# Patient Record
Sex: Female | Born: 1960
Health system: Southern US, Community
[De-identification: ages and names within clinical notes are randomized; demographics above are authoritative.]

## PROBLEM LIST (undated history)

## (undated) DIAGNOSIS — K5900 Constipation, unspecified: Secondary | ICD-10-CM

## (undated) DIAGNOSIS — T7840XA Allergy, unspecified, initial encounter: Secondary | ICD-10-CM

## (undated) DIAGNOSIS — M47816 Spondylosis without myelopathy or radiculopathy, lumbar region: Secondary | ICD-10-CM

## (undated) HISTORY — PX: VAGINAL HYSTERECTOMY: SUR661

## (undated) HISTORY — DX: Spondylosis without myelopathy or radiculopathy, lumbar region: M47.816

## (undated) HISTORY — DX: Allergy, unspecified, initial encounter: T78.40XA

## (undated) HISTORY — PX: BREAST CYST EXCISION: SHX579

## (undated) HISTORY — PX: CHOLECYSTECTOMY: SHX55

## (undated) HISTORY — DX: Constipation, unspecified: K59.00

---

## 1997-04-13 ENCOUNTER — Other Ambulatory Visit: Admission: RE | Admit: 1997-04-13 | Discharge: 1997-04-13 | Payer: Self-pay | Admitting: Obstetrics and Gynecology

## 1997-06-15 ENCOUNTER — Observation Stay (HOSPITAL_COMMUNITY): Admission: AD | Admit: 1997-06-15 | Discharge: 1997-06-17 | Payer: Self-pay | Admitting: Obstetrics and Gynecology

## 1998-02-17 ENCOUNTER — Ambulatory Visit (HOSPITAL_COMMUNITY): Admission: RE | Admit: 1998-02-17 | Discharge: 1998-02-17 | Payer: Self-pay | Admitting: *Deleted

## 1998-07-15 ENCOUNTER — Other Ambulatory Visit: Admission: RE | Admit: 1998-07-15 | Discharge: 1998-07-15 | Payer: Self-pay | Admitting: Obstetrics and Gynecology

## 1998-09-17 ENCOUNTER — Encounter: Payer: Self-pay | Admitting: Family Medicine

## 1998-09-17 ENCOUNTER — Ambulatory Visit (HOSPITAL_COMMUNITY): Admission: RE | Admit: 1998-09-17 | Discharge: 1998-09-17 | Payer: Self-pay | Admitting: Family Medicine

## 2000-08-20 ENCOUNTER — Other Ambulatory Visit: Admission: RE | Admit: 2000-08-20 | Discharge: 2000-08-20 | Payer: Self-pay | Admitting: Obstetrics and Gynecology

## 2001-09-26 ENCOUNTER — Other Ambulatory Visit: Admission: RE | Admit: 2001-09-26 | Discharge: 2001-09-26 | Payer: Self-pay | Admitting: Obstetrics and Gynecology

## 2002-09-04 ENCOUNTER — Emergency Department (HOSPITAL_COMMUNITY): Admission: EM | Admit: 2002-09-04 | Discharge: 2002-09-04 | Payer: Self-pay | Admitting: *Deleted

## 2002-12-12 ENCOUNTER — Other Ambulatory Visit: Admission: RE | Admit: 2002-12-12 | Discharge: 2002-12-12 | Payer: Self-pay | Admitting: Obstetrics and Gynecology

## 2004-02-03 ENCOUNTER — Other Ambulatory Visit: Admission: RE | Admit: 2004-02-03 | Discharge: 2004-02-03 | Payer: Self-pay | Admitting: Obstetrics and Gynecology

## 2004-02-10 ENCOUNTER — Ambulatory Visit: Payer: Self-pay | Admitting: Cardiovascular Disease

## 2005-03-20 ENCOUNTER — Other Ambulatory Visit: Admission: RE | Admit: 2005-03-20 | Discharge: 2005-03-20 | Payer: Self-pay | Admitting: Obstetrics and Gynecology

## 2005-04-12 ENCOUNTER — Emergency Department (HOSPITAL_COMMUNITY): Admission: EM | Admit: 2005-04-12 | Discharge: 2005-04-12 | Payer: Self-pay | Admitting: Emergency Medicine

## 2007-08-09 IMAGING — CR DG CHEST 2V
1 series · 2 of 2 positions shown · non-contrast
Comparison: NONE

CLINICAL DATA: Attn. CRISS  Extreme fatigue. 

CHEST TWO VIEW (PA AND LATERAL)

[Series 1: view not recorded · 0.17mm/px · 2 of 2 slices shown]
[im 1/2]
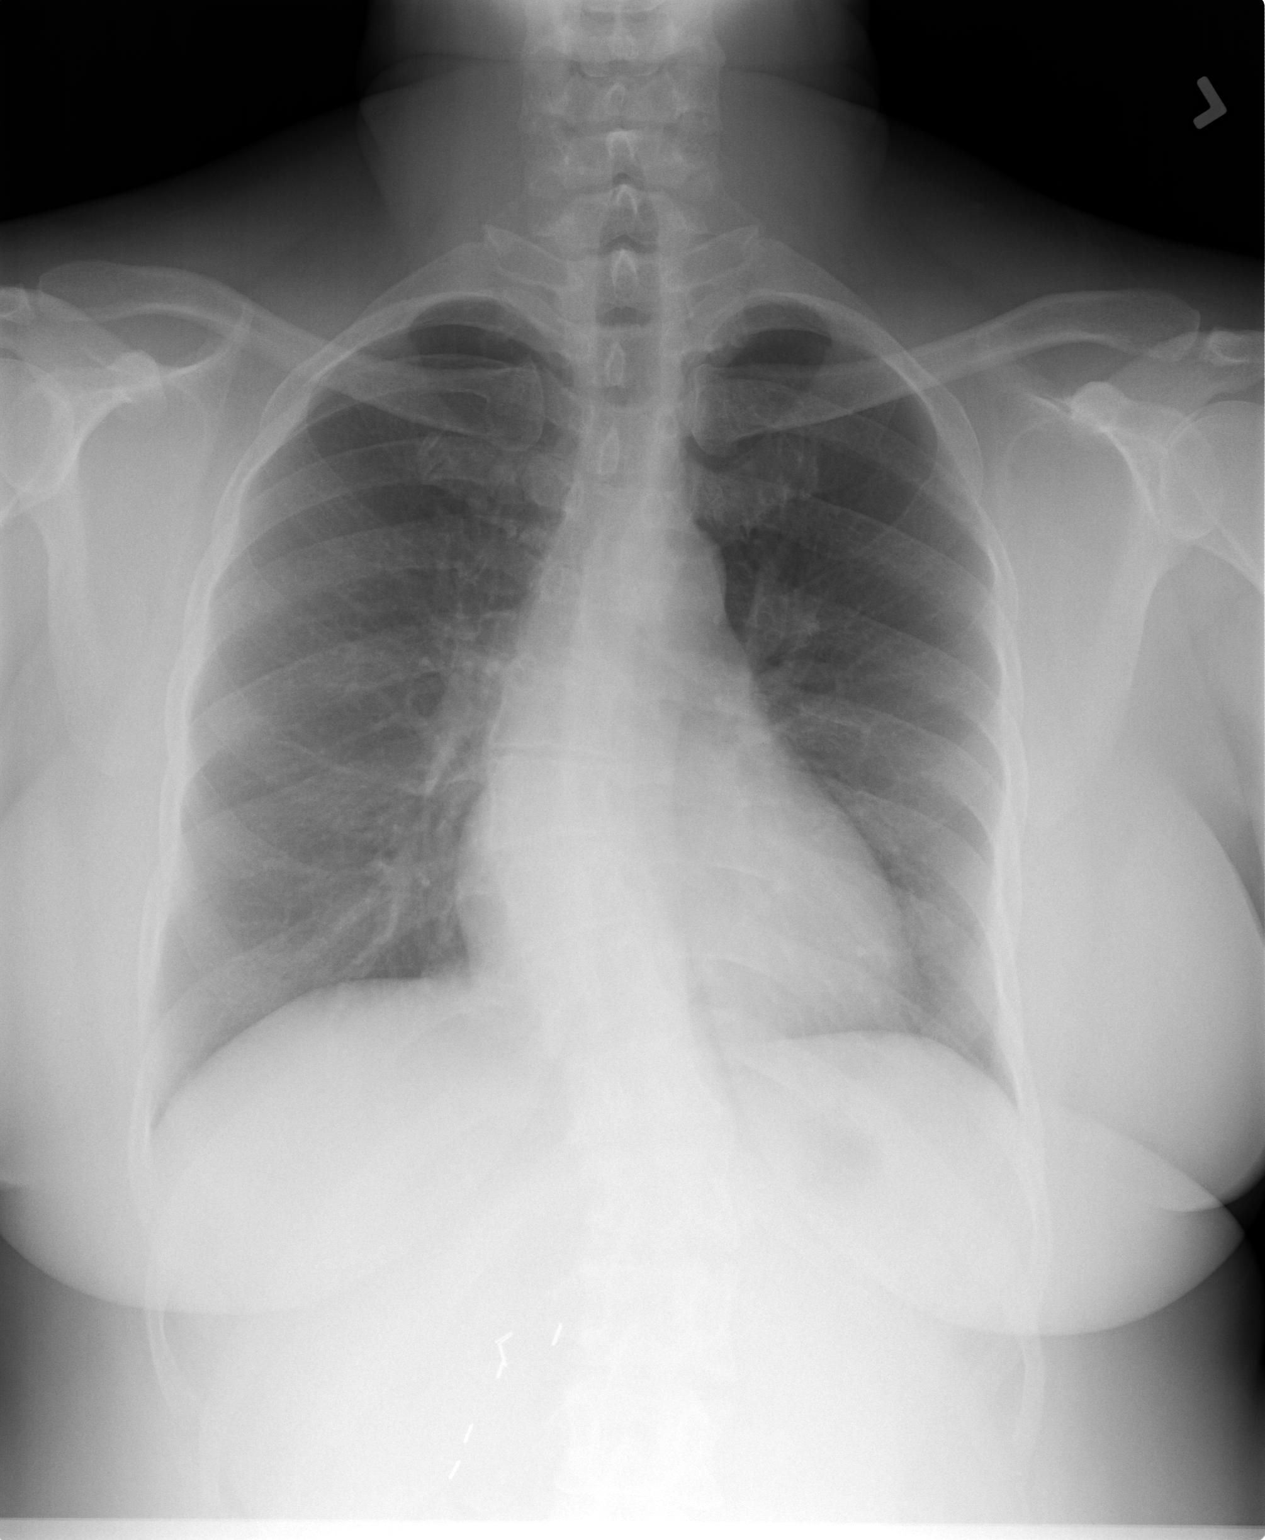
[im 2/2]
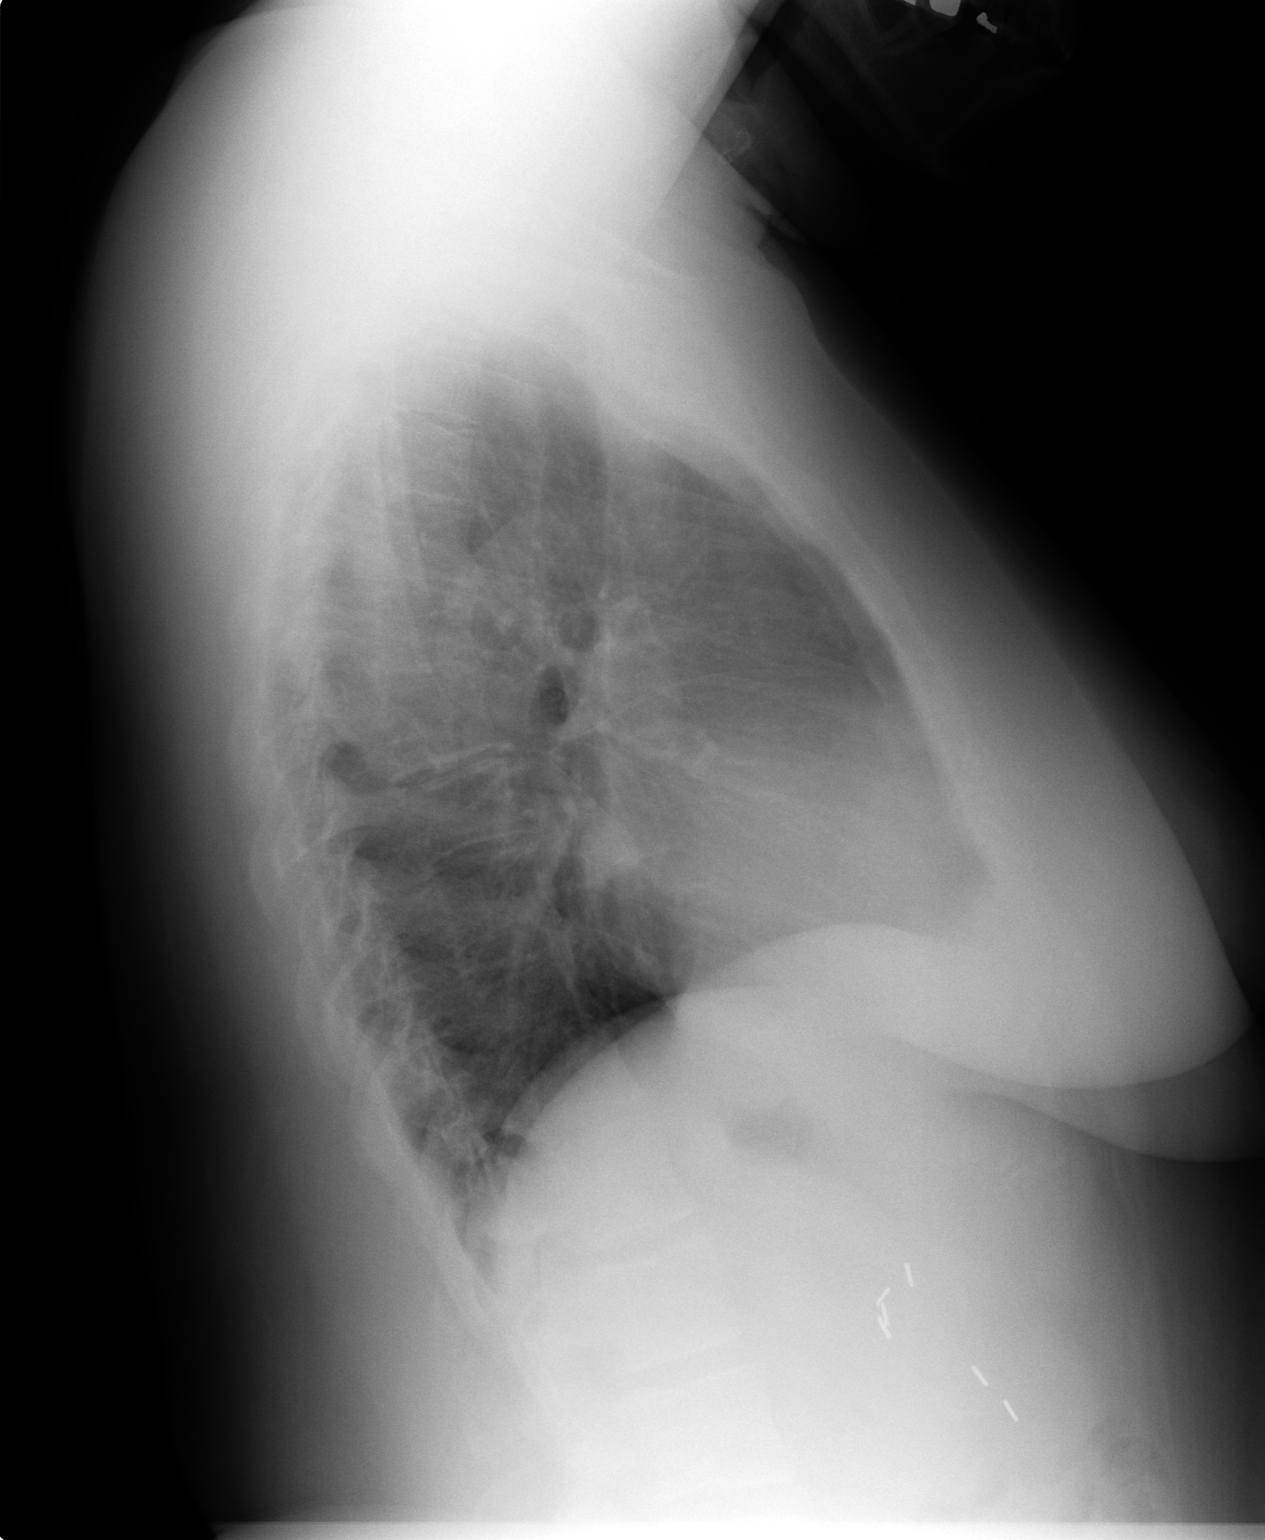

[2 of 2 positions shown; findings below may reference images not displayed]

FINDINGS: Heart size is normal. Lungs are clear. Thoracic and 
thoracolumbar scoliosis, S-type, 10-15 degrees in the thoracic and 
thoracolumbar region.
IMPRESSION: Scoliosis. No acute process in the chest. CRISS 
[DATE]  Tran Date:  [DATE] DAS  [REDACTED]

## 2009-10-27 ENCOUNTER — Encounter: Admission: RE | Admit: 2009-10-27 | Discharge: 2009-10-27 | Payer: Self-pay | Admitting: Family Medicine

## 2009-10-27 IMAGING — CT CT HEAD W/O CM
2 series · 16 of 30 positions shown, 18 images · non-contrast
Comparison: None

CLINICAL DATA: Headaches and dizziness.

CT HEAD WITHOUT CONTRAST
TECHNIQUE: Contiguous axial images were obtained from the base of
the skull through the vertex without contrast.

[Series 2: head w/o · axial · non-contrast · 0.43mm/px · z∈[+34,+145]mm · 8 of 28 slices shown, 10 images]
[im 4/28  brain]
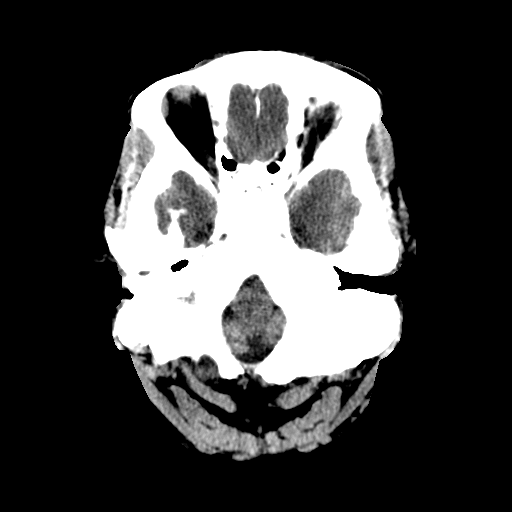
[im 4/28  bone]
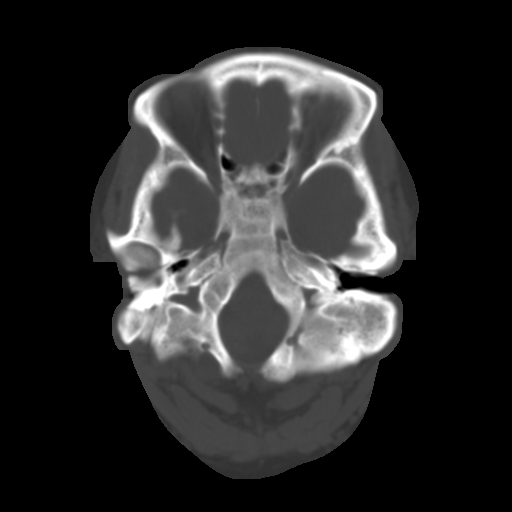
[im 7/28  brain]
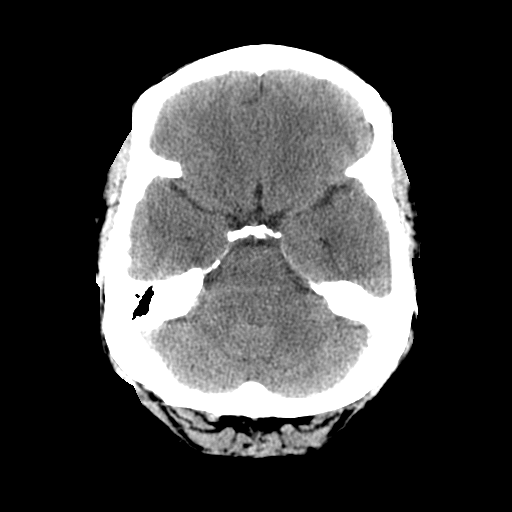
[im 10/28  brain]
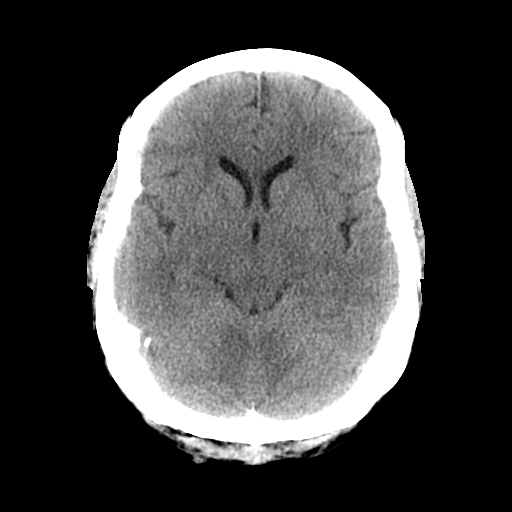
[im 13/28  brain]
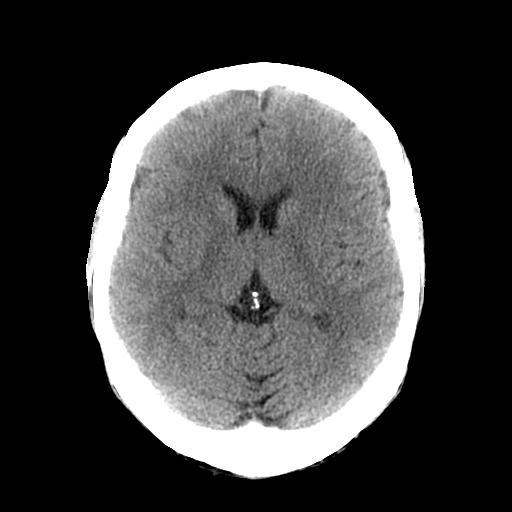
[im 16/28  brain]
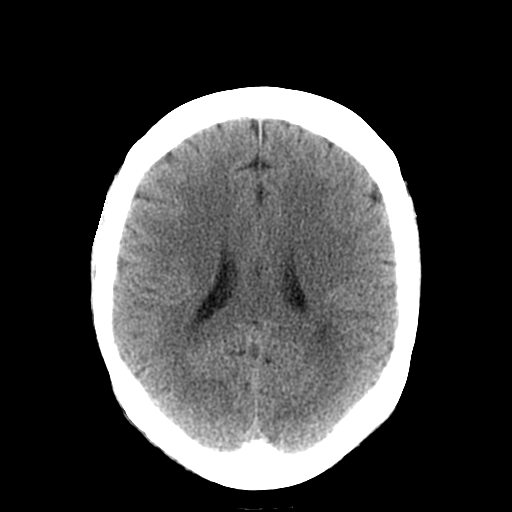
[im 16/28  bone]
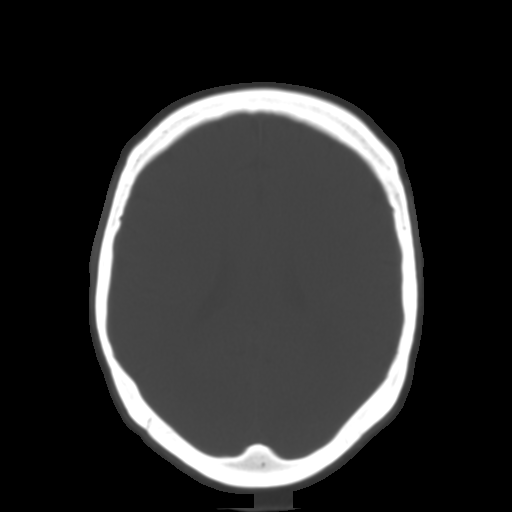
[im 19/28  brain]
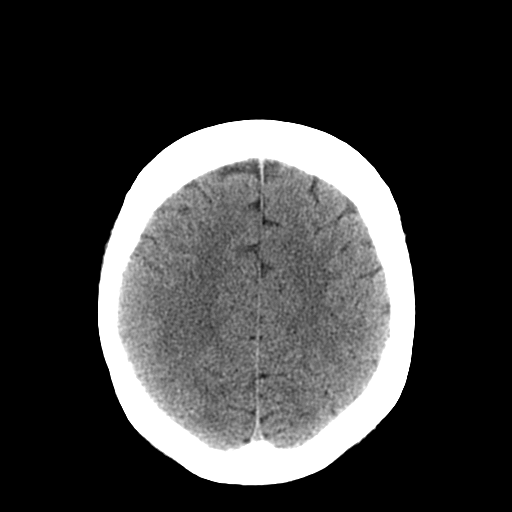
[im 22/28  brain]
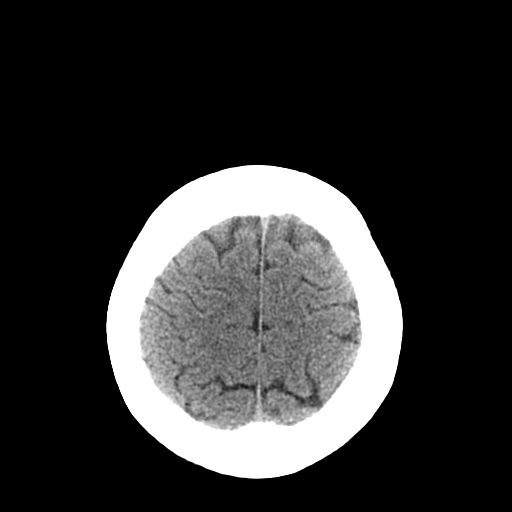
[im 25/28  brain]
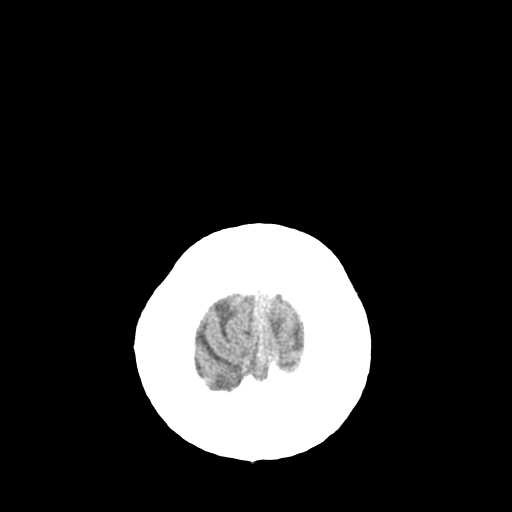

[Series 3: head bone · axial · 0.43mm/px · z∈[+30,+146]mm · 8 of 56 slices shown]
[im 6/56  bone]
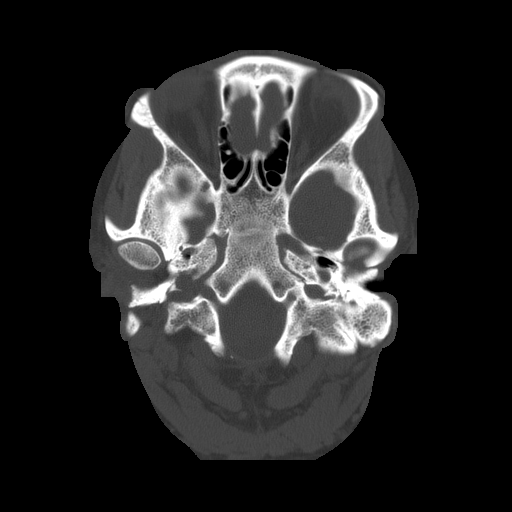
[im 12/56  bone]
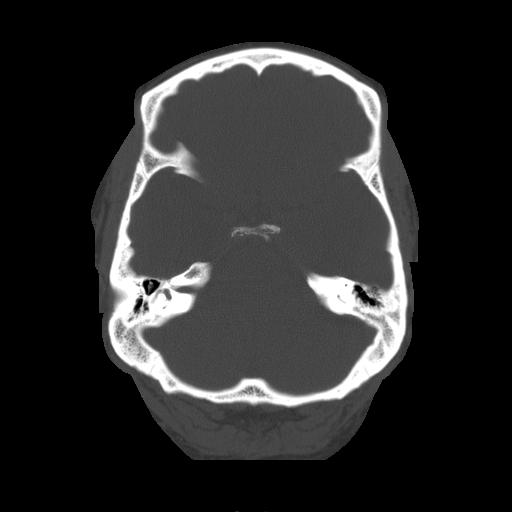
[im 18/56  bone]
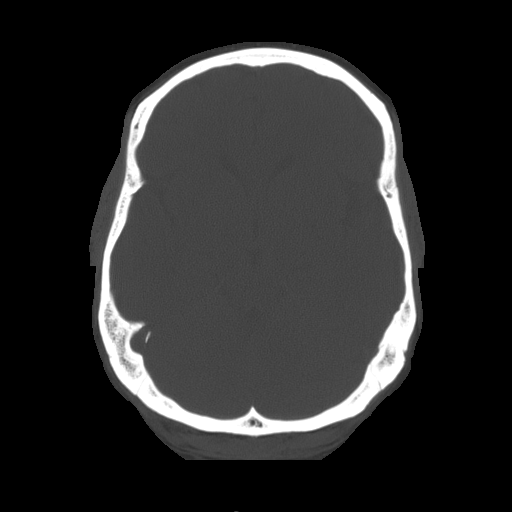
[im 24/56  bone]
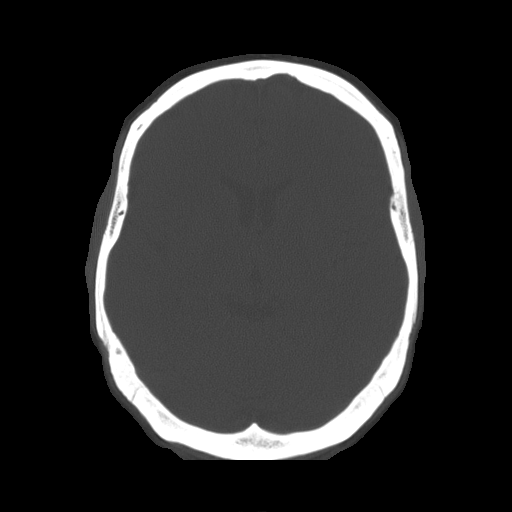
[im 32/56  bone]
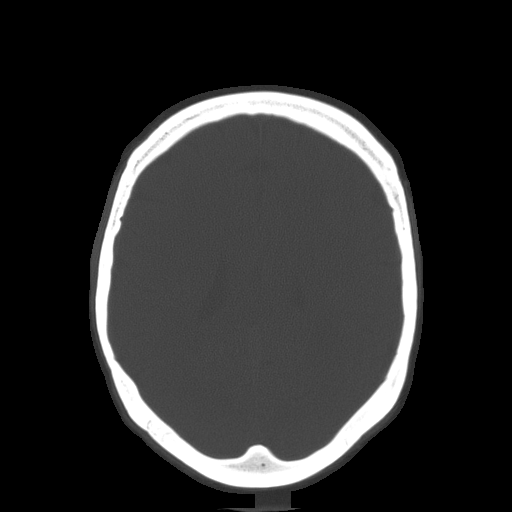
[im 38/56  bone]
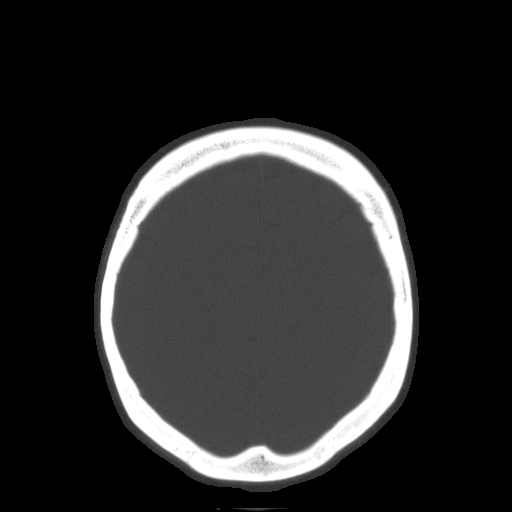
[im 44/56  bone]
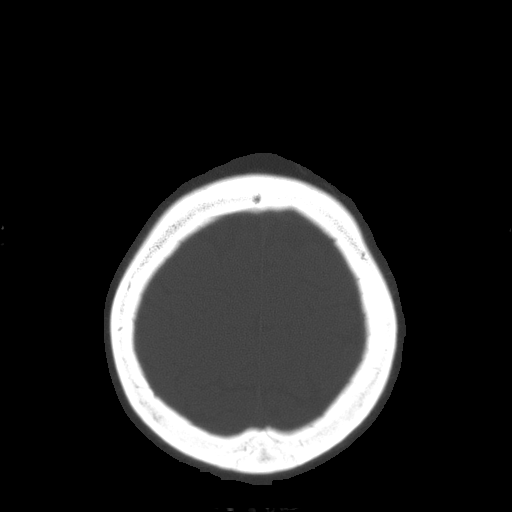
[im 50/56  bone]
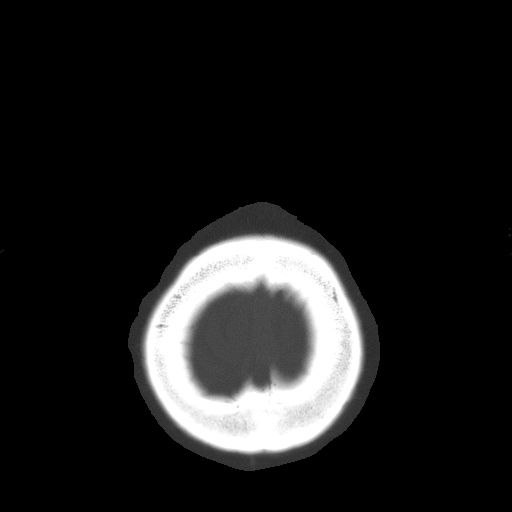

[16 of 30 positions shown; findings below may reference images not displayed]

FINDINGS: The brain has a normal appearance without evidence for
hemorrhage, infarction, hydrocephalus, or mass lesion.  There is no
extra axial fluid collection.  The skull and paranasal sinuses are
normal.
IMPRESSION: No acute intracranial abnormalities.

## 2010-01-23 ENCOUNTER — Encounter: Payer: Self-pay | Admitting: Obstetrics and Gynecology

## 2010-05-16 ENCOUNTER — Ambulatory Visit (AMBULATORY_SURGERY_CENTER): Payer: BC Managed Care – PPO | Admitting: *Deleted

## 2010-05-16 VITALS — Ht 64.0 in | Wt 220.3 lb

## 2010-05-16 DIAGNOSIS — K625 Hemorrhage of anus and rectum: Secondary | ICD-10-CM

## 2010-05-16 DIAGNOSIS — K649 Unspecified hemorrhoids: Secondary | ICD-10-CM

## 2010-05-16 MED ORDER — PEG-KCL-NACL-NASULF-NA ASC-C 100 G PO SOLR: ORAL | Status: DC

## 2010-05-17 ENCOUNTER — Encounter: Payer: Self-pay | Admitting: Internal Medicine

## 2010-05-20 ENCOUNTER — Other Ambulatory Visit: Payer: Self-pay | Admitting: Internal Medicine

## 2010-05-24 ENCOUNTER — Ambulatory Visit (AMBULATORY_SURGERY_CENTER): Payer: BC Managed Care – PPO | Admitting: Internal Medicine

## 2010-05-24 ENCOUNTER — Encounter: Payer: Self-pay | Admitting: Internal Medicine

## 2010-05-24 VITALS — HR 84 | Temp 97.2°F | Resp 18 | Ht 64.0 in | Wt 220.0 lb

## 2010-05-24 DIAGNOSIS — K635 Polyp of colon: Secondary | ICD-10-CM

## 2010-05-24 DIAGNOSIS — Z1211 Encounter for screening for malignant neoplasm of colon: Secondary | ICD-10-CM

## 2010-05-24 DIAGNOSIS — D126 Benign neoplasm of colon, unspecified: Secondary | ICD-10-CM

## 2010-05-24 DIAGNOSIS — K5730 Diverticulosis of large intestine without perforation or abscess without bleeding: Secondary | ICD-10-CM

## 2010-05-24 DIAGNOSIS — K625 Hemorrhage of anus and rectum: Secondary | ICD-10-CM

## 2010-05-24 LAB — HM COLONOSCOPY

## 2010-05-24 MED ORDER — SODIUM CHLORIDE 0.9 % IV SOLN: 500.0000 mL | INTRAVENOUS | Status: DC

## 2010-05-24 NOTE — Patient Instructions (Signed)
Resume all medications. Information given on polyps, diverticulosis, high fiber diet. 

## 2010-05-25 ENCOUNTER — Telehealth: Payer: Self-pay

## 2010-05-25 NOTE — Telephone Encounter (Signed)
No ID on answering machine. 

## 2010-09-09 ENCOUNTER — Other Ambulatory Visit: Payer: Self-pay | Admitting: Obstetrics and Gynecology

## 2010-09-09 DIAGNOSIS — R928 Other abnormal and inconclusive findings on diagnostic imaging of breast: Secondary | ICD-10-CM

## 2010-10-05 ENCOUNTER — Ambulatory Visit
Admission: RE | Admit: 2010-10-05 | Discharge: 2010-10-05 | Disposition: A | Discharge status: Home / Self Care | Payer: BC Managed Care – PPO | Source: Ambulatory Visit | Attending: Obstetrics and Gynecology | Admitting: Obstetrics and Gynecology

## 2010-10-05 DIAGNOSIS — R928 Other abnormal and inconclusive findings on diagnostic imaging of breast: Secondary | ICD-10-CM

## 2010-10-06 IMAGING — MG MM DIGITAL DIAGNOSTIC UNILAT L {BCG}
2 series · 2 of 2 positions shown · non-contrast
Comparison: Multiple priors

CLINICAL DATA: Abnormal screening, left breast

DIGITAL DIAGNOSTIC LEFT MAMMOGRAM WITHOUT CAD AND LEFT BREAST
ULTRASOUND:

[L CC]
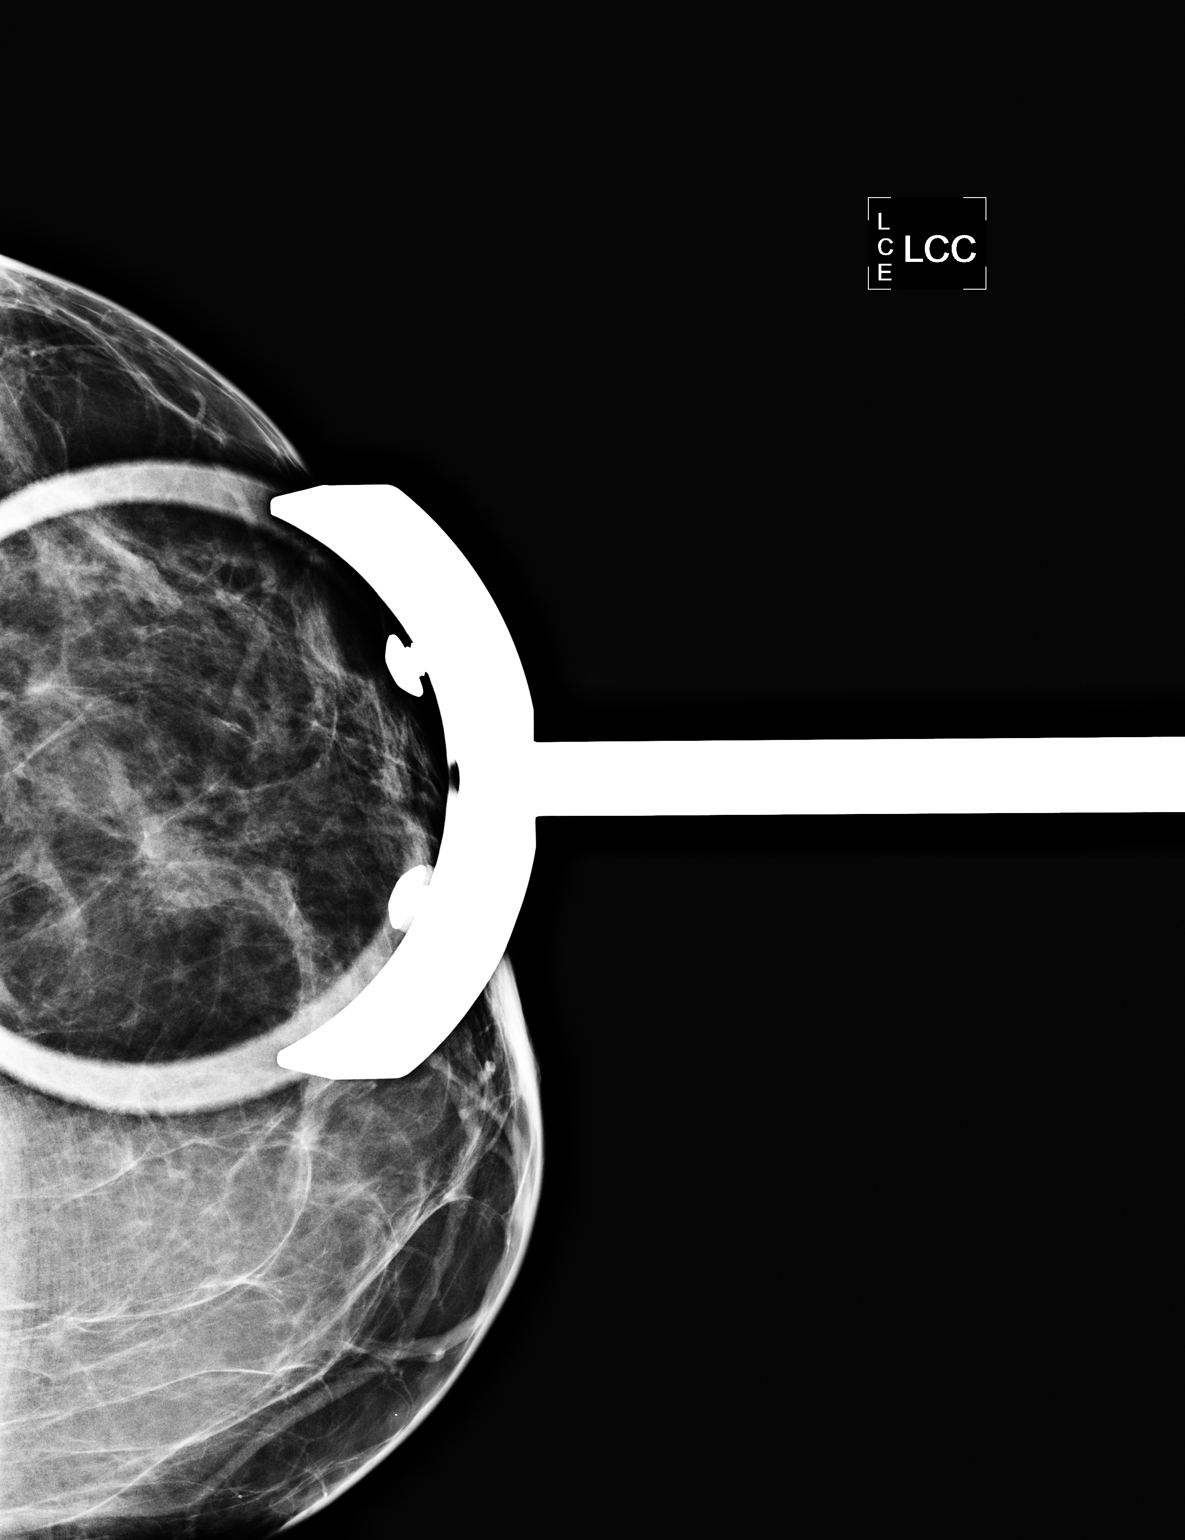

[L MLO]
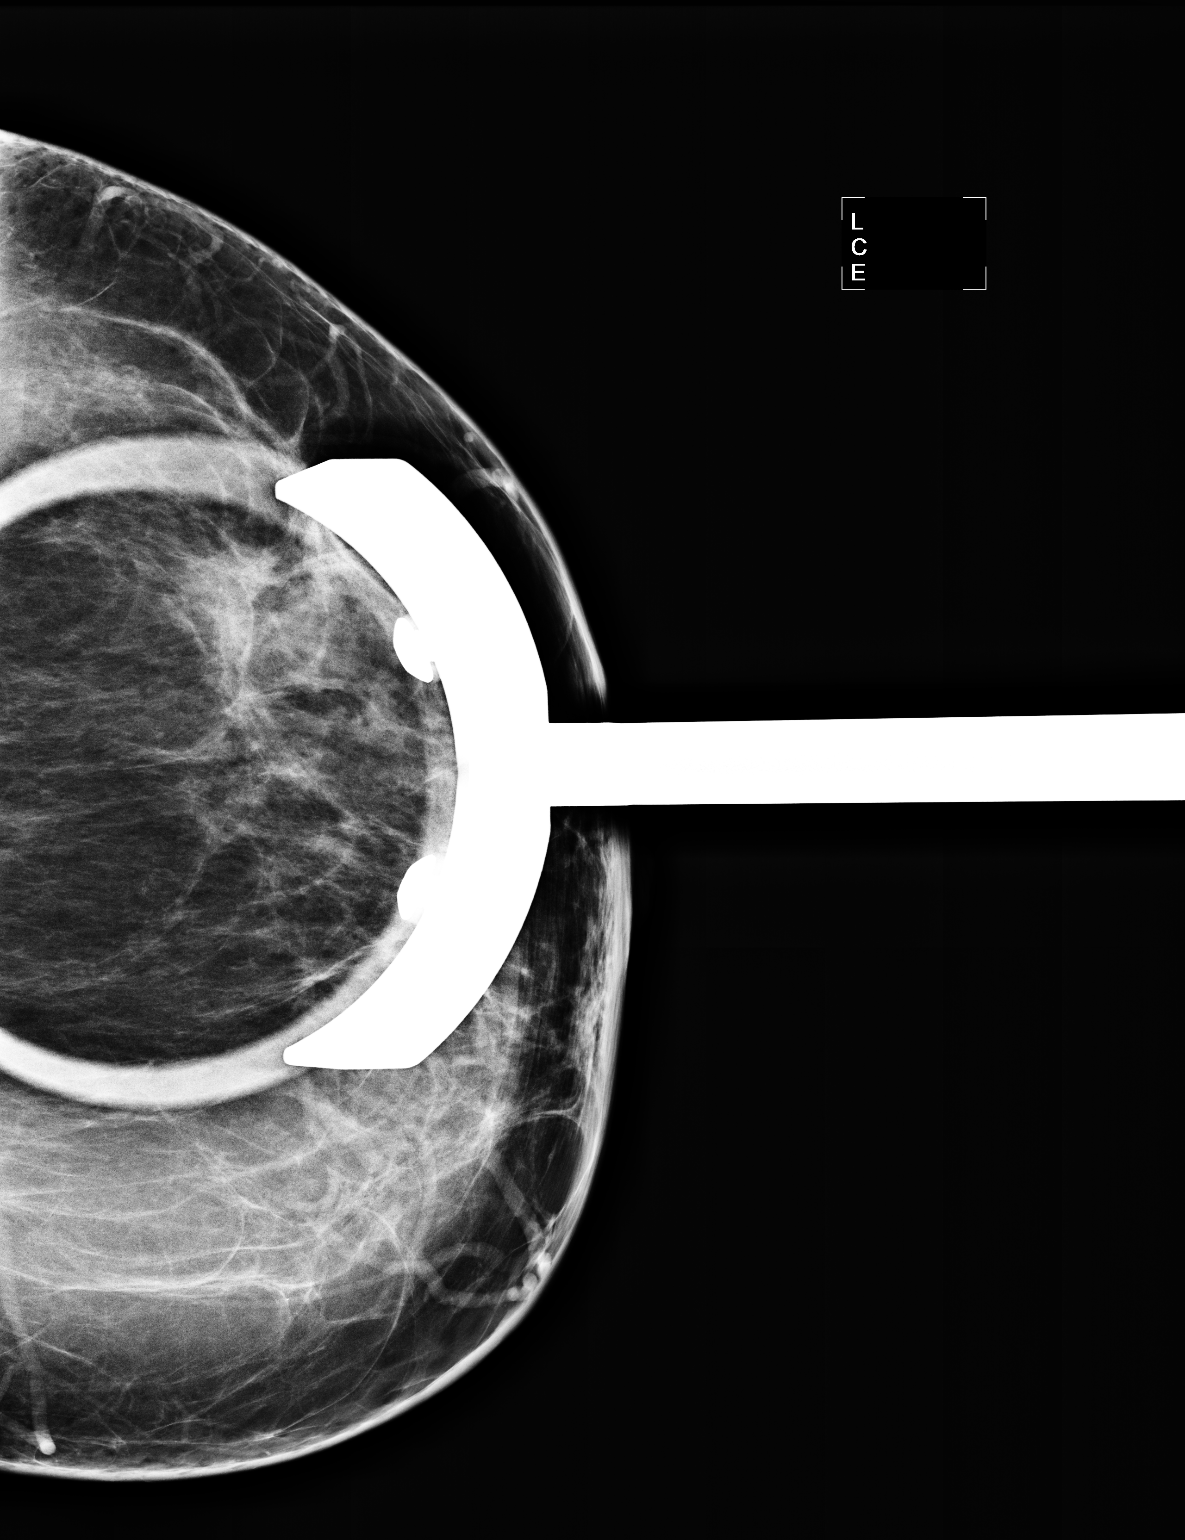

[2 of 2 positions shown; findings below may reference images not displayed]

FINDINGS: Spot compression views in the upper-outer quadrant of
the right breast demonstrate glandular tissue without discrete
mass.  Distortion is noted in the middle third of the breast.

On physical exam, I see a scar along the two to three o'clock
position of the left breast 4 cm from the nipple.  No mass is
palpated.

Ultrasound is performed, showing normal tissue in the upper-outer
quadrant of the left breast.
IMPRESSION: Postsurgical changes, left breast.  No evidence of
malignancy.  Recommend screening mammography in 1 year.

BI-RADS CATEGORY 2:  Benign finding(s).

## 2012-04-01 ENCOUNTER — Other Ambulatory Visit: Payer: Self-pay | Admitting: Obstetrics and Gynecology

## 2012-04-01 DIAGNOSIS — N644 Mastodynia: Secondary | ICD-10-CM

## 2012-04-01 DIAGNOSIS — N63 Unspecified lump in unspecified breast: Secondary | ICD-10-CM

## 2012-04-11 ENCOUNTER — Other Ambulatory Visit: Payer: BC Managed Care – PPO

## 2012-04-22 ENCOUNTER — Other Ambulatory Visit: Payer: BC Managed Care – PPO

## 2014-02-19 ENCOUNTER — Other Ambulatory Visit: Payer: Self-pay | Admitting: Family Medicine

## 2014-02-19 ENCOUNTER — Ambulatory Visit
Admission: RE | Admit: 2014-02-19 | Discharge: 2014-02-19 | Disposition: A | Discharge status: Home / Self Care | Payer: 59 | Source: Ambulatory Visit | Attending: Family Medicine | Admitting: Family Medicine

## 2014-02-19 DIAGNOSIS — K5900 Constipation, unspecified: Secondary | ICD-10-CM

## 2014-02-19 IMAGING — CR DG ABDOMEN 2V
2 series · 2 of 2 positions shown · non-contrast
Comparison: None.

CLINICAL DATA: Left upper quadrant pain.

EXAM:
ABDOMEN - 2 VIEW

[view not recorded (1 of 2)]
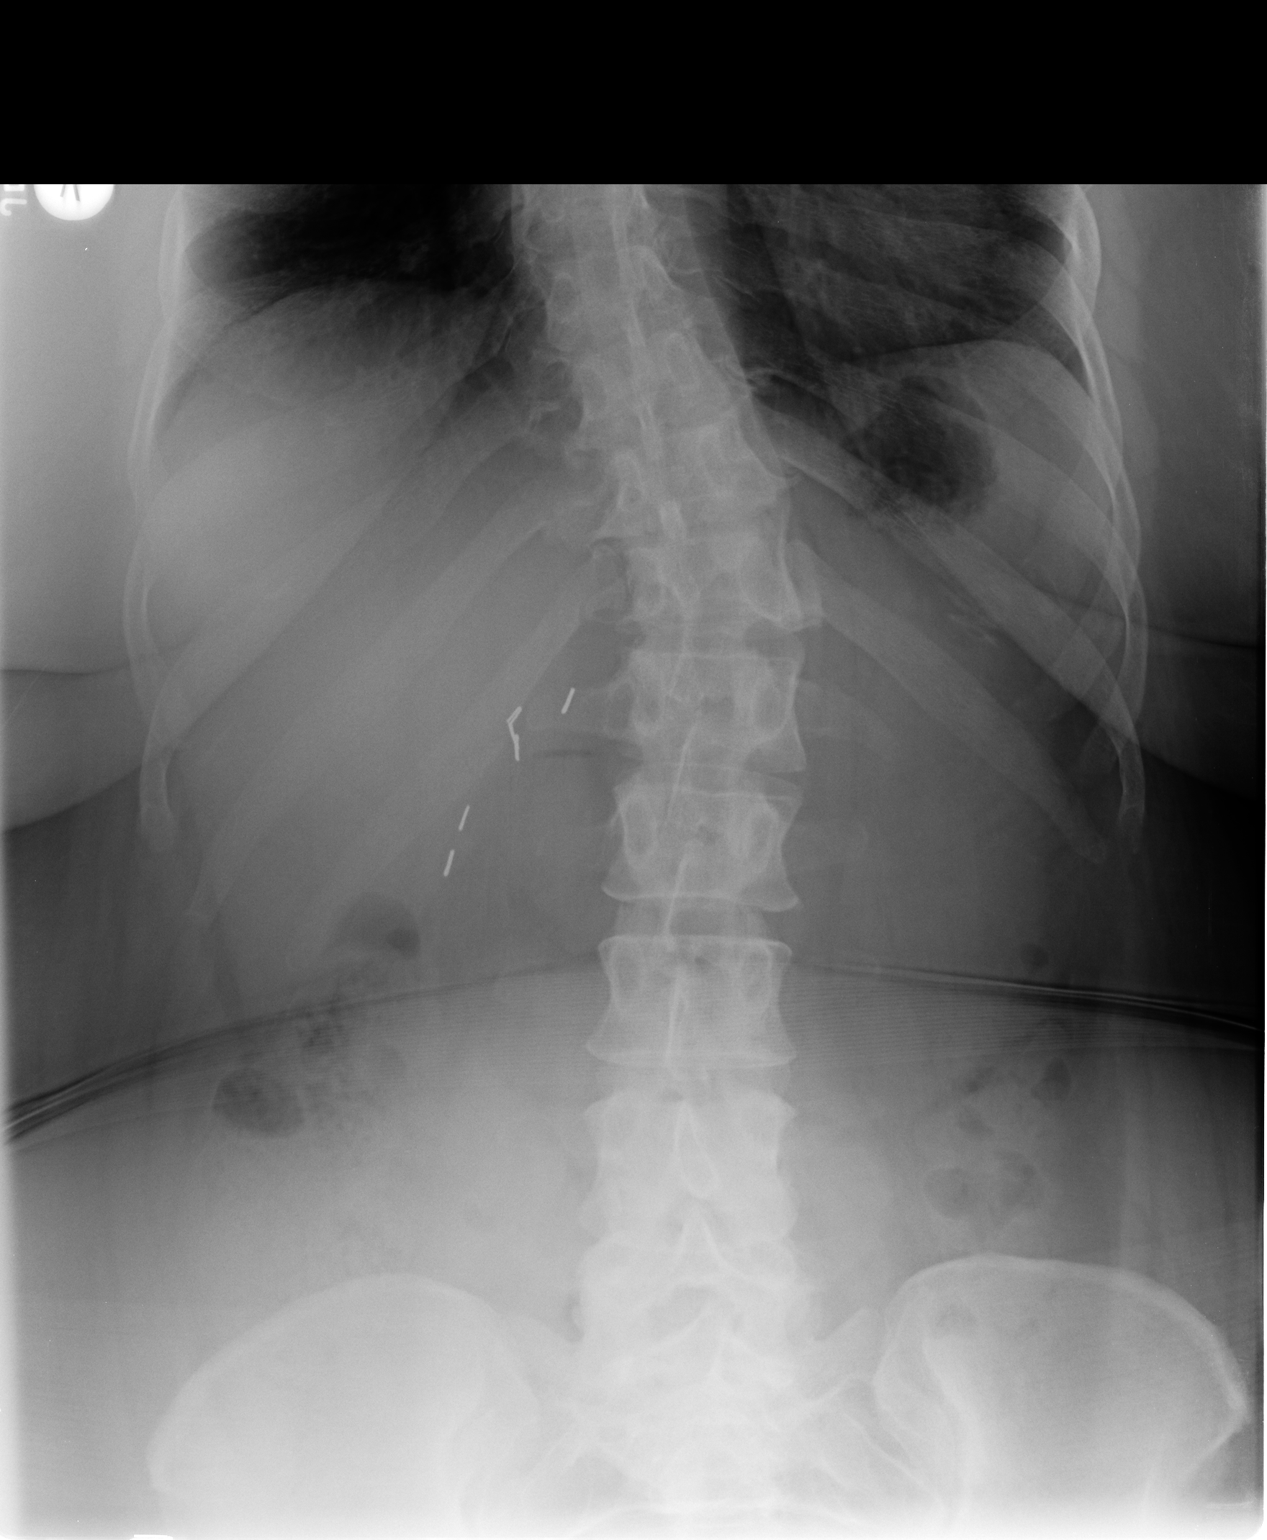

[view not recorded (2 of 2)]
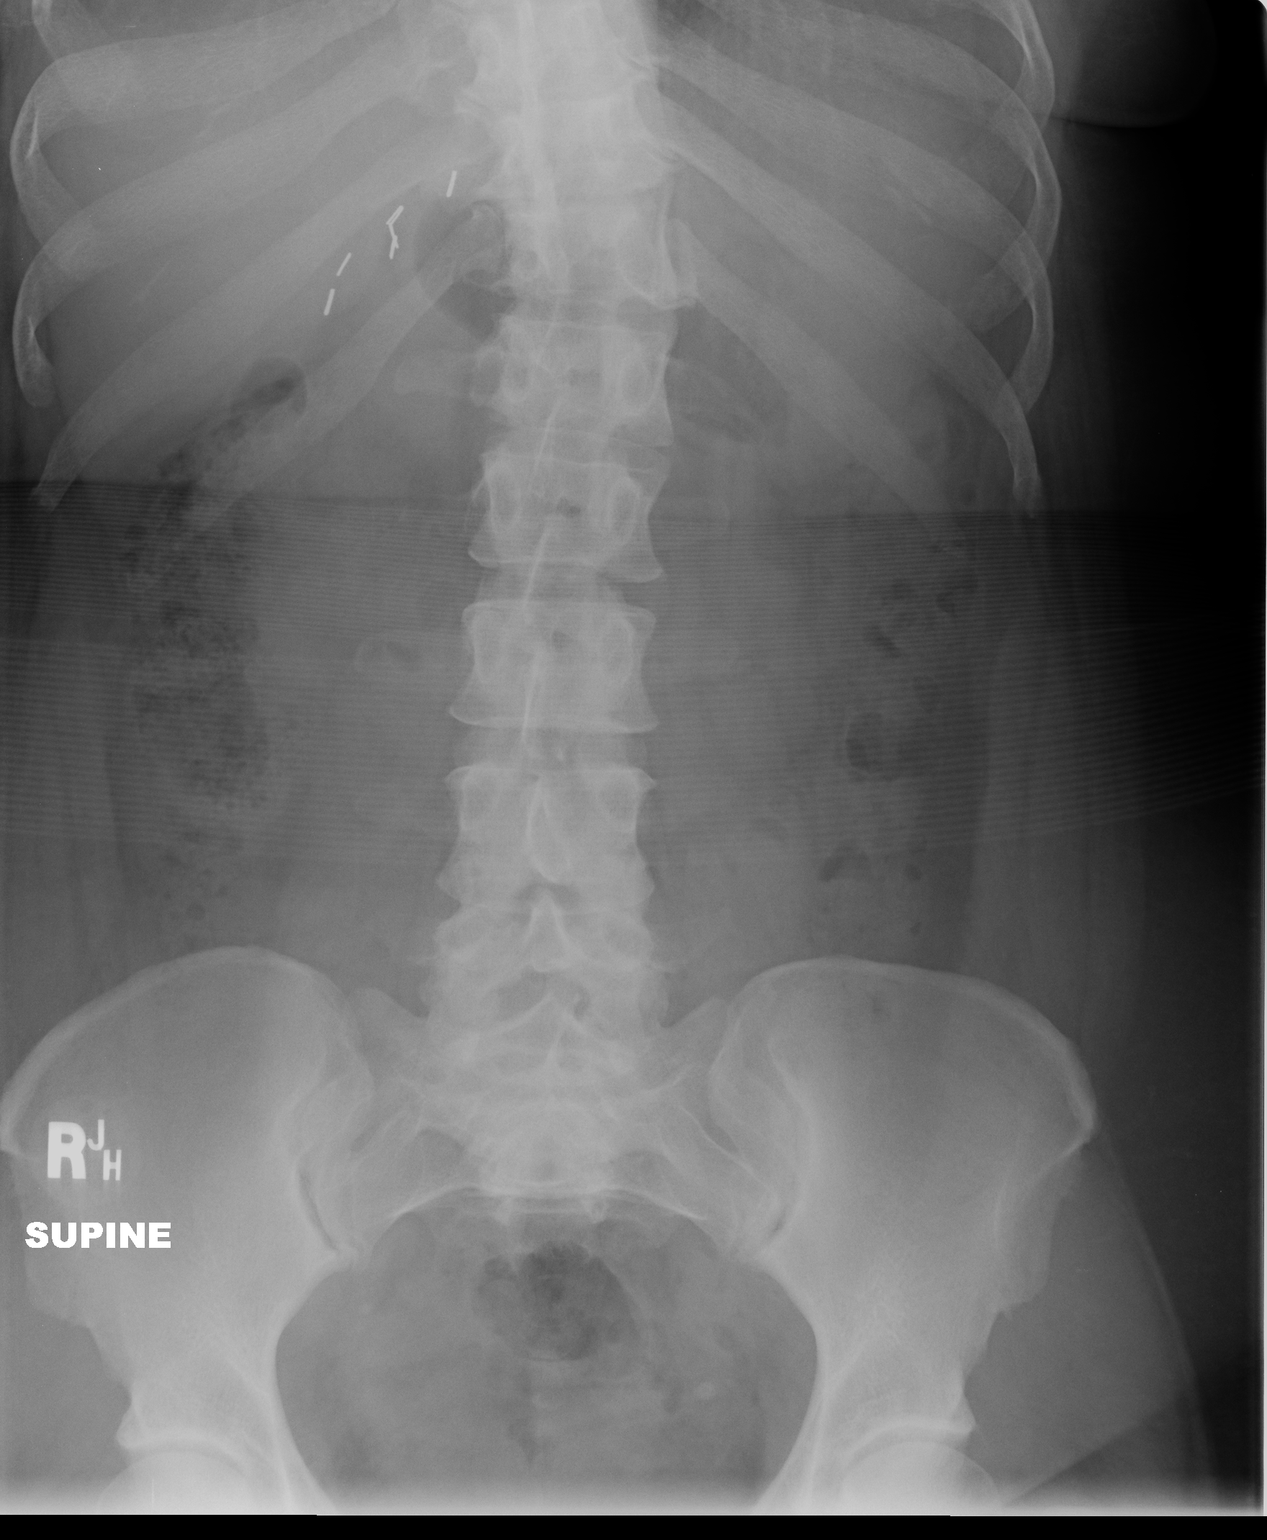

[2 of 2 positions shown; findings below may reference images not displayed]

FINDINGS: Soft tissue structures unremarkable. Surgical clips right upper
quadrant. Nondistended bowel. No free air . Stool noted colon.
Calcified pelvic density noted consistent with phleboliths.
Thoracolumbar spine scoliosis.
IMPRESSION: No acute or focal abnormality.  Thoracolumbar spine scoliosis.

## 2014-03-31 ENCOUNTER — Other Ambulatory Visit: Payer: Self-pay | Admitting: Gastroenterology

## 2014-03-31 DIAGNOSIS — R140 Abdominal distension (gaseous): Secondary | ICD-10-CM

## 2014-03-31 DIAGNOSIS — R1012 Left upper quadrant pain: Secondary | ICD-10-CM

## 2014-04-06 ENCOUNTER — Other Ambulatory Visit: Payer: 59

## 2014-04-06 ENCOUNTER — Ambulatory Visit
Admission: RE | Admit: 2014-04-06 | Discharge: 2014-04-06 | Disposition: A | Discharge status: Home / Self Care | Payer: 59 | Source: Ambulatory Visit | Attending: Gastroenterology | Admitting: Gastroenterology

## 2014-04-06 DIAGNOSIS — R140 Abdominal distension (gaseous): Secondary | ICD-10-CM

## 2014-04-06 DIAGNOSIS — R1012 Left upper quadrant pain: Secondary | ICD-10-CM

## 2014-04-06 IMAGING — CT CT ABD-PELV W/ CM
3 of 5 series · 12 of 36 positions shown, 18 images · IV contrast (READICAT/WATER & [ID] ISOVUE 300)
Comparison: None.

CLINICAL DATA: Left upper quadrant abdominal pain and abdominal
bloating for 7 months. History of partial hysterectomy.

EXAM:
CT ABDOMEN AND PELVIS WITH CONTRAST
TECHNIQUE: Multidetector CT imaging of the abdomen and pelvis was performed
using the standard protocol following bolus administration of
intravenous contrast.
CONTRAST:  125 cc [JL]

[Series 3: abd/pelvis with · axial · 0.70mm/px · z∈[-308,-8]mm · 7 of 81 slices shown, 12 images]
[im 11/81  soft-tissue]
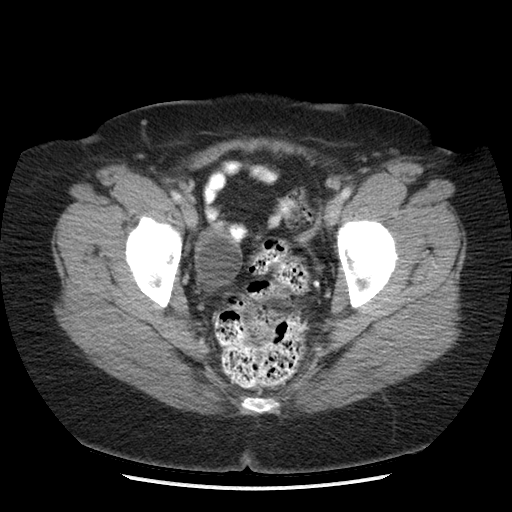
[im 11/81  bone]
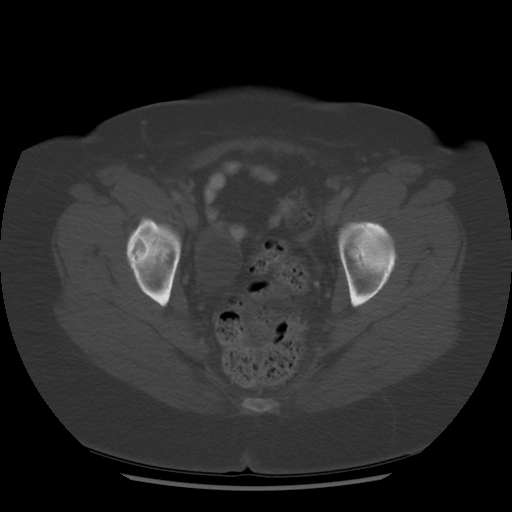
[im 21/81  soft-tissue]
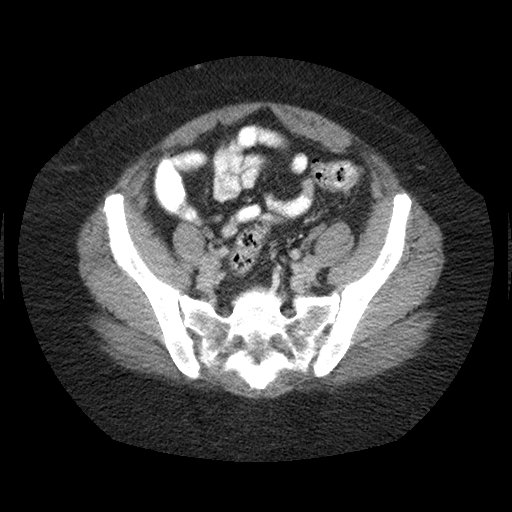
[im 31/81  soft-tissue]
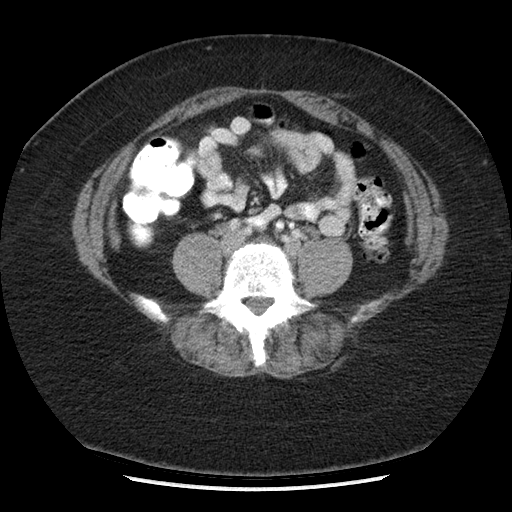
[im 41/81  soft-tissue]
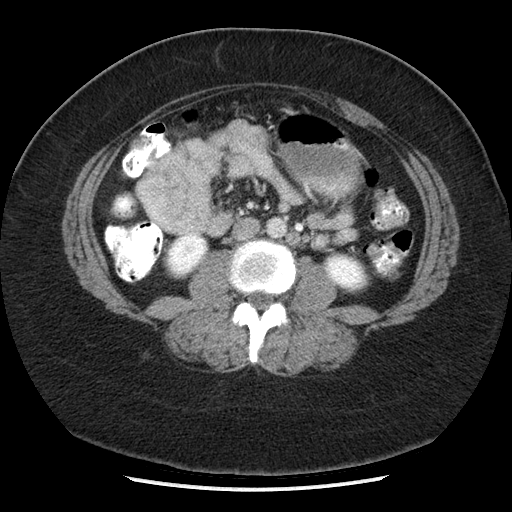
[im 41/81  lung]
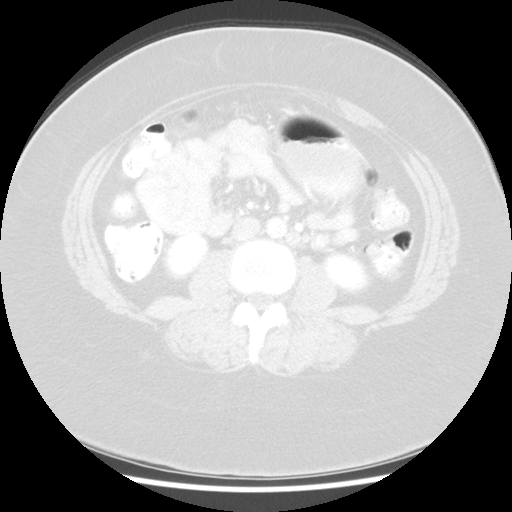
[im 51/81  soft-tissue]
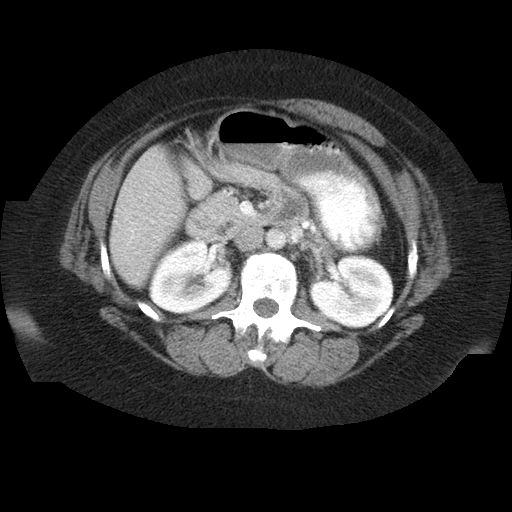
[im 51/81  lung]
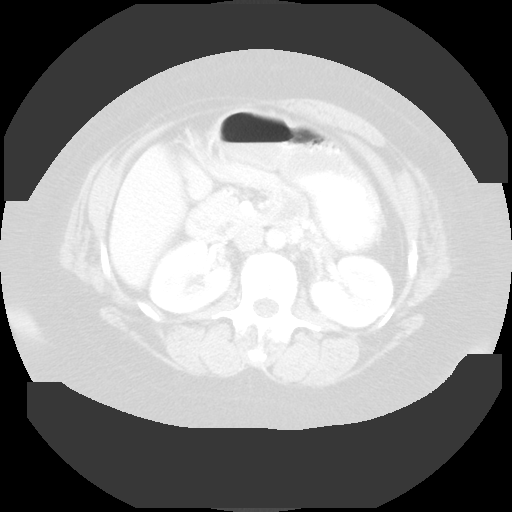
[im 61/81  soft-tissue]
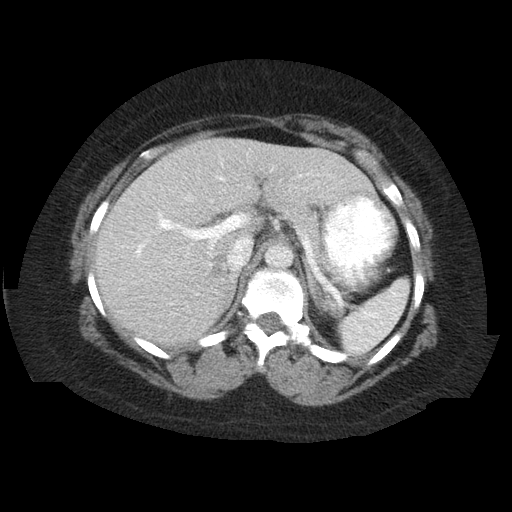
[im 61/81  lung]
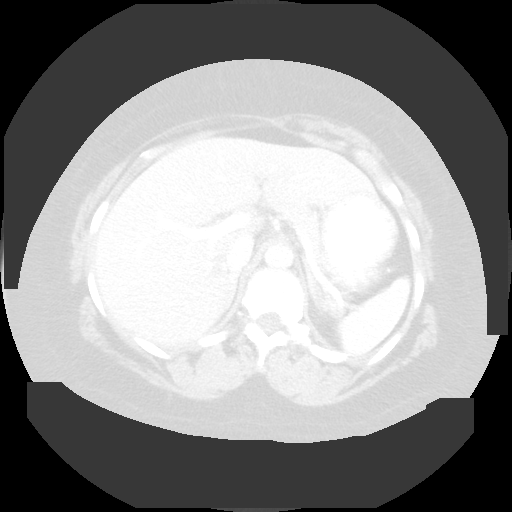
[im 71/81  soft-tissue]
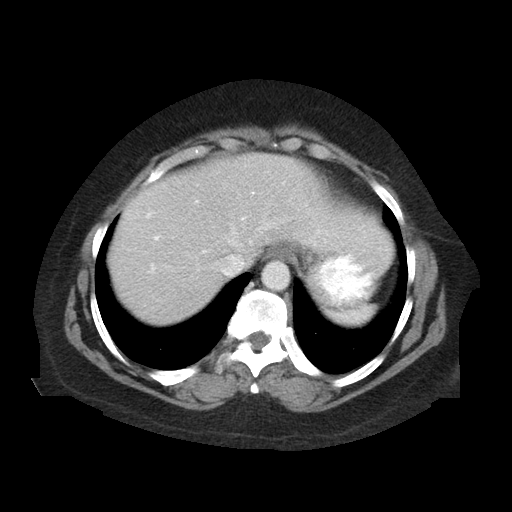
[im 71/81  lung]
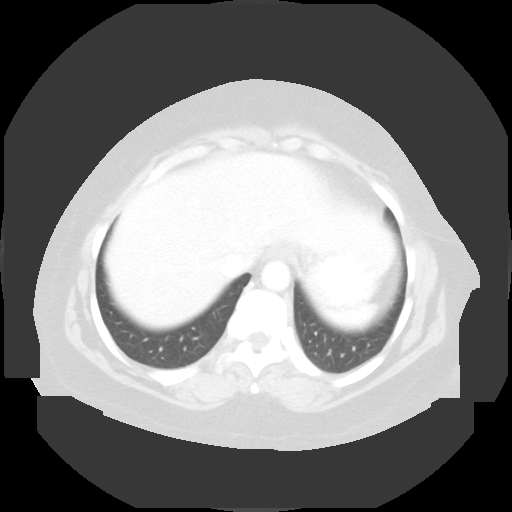

[Series 601: coronal body · coronal · 0.92mm/px · 1 of 129 slices shown, 2 images]
[im 43/129  soft-tissue]
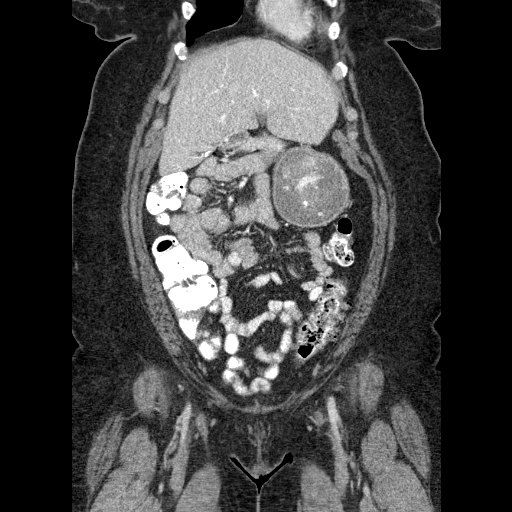
[im 43/129  bone]
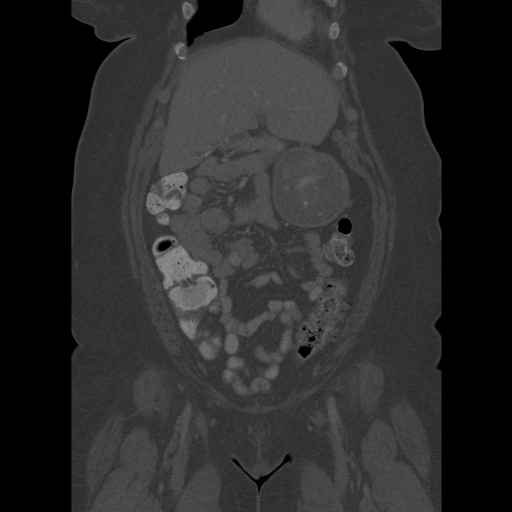

[Series 602: sagittal body · sagittal · 0.92mm/px · 4 of 145 slices shown]
[im 10/145  soft-tissue]
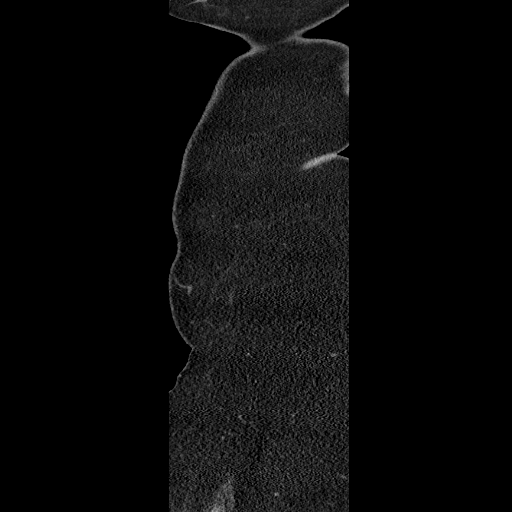
[im 28/145  soft-tissue]
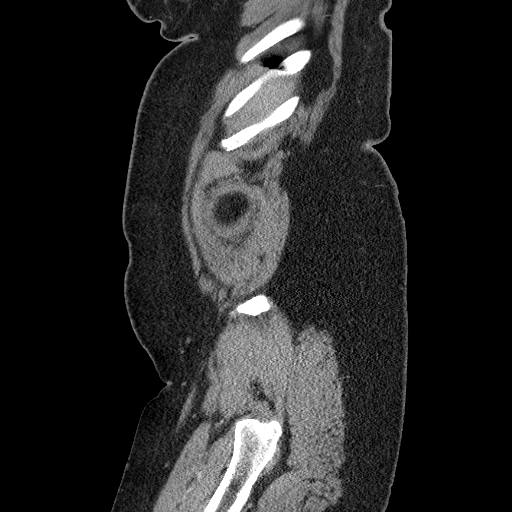
[im 46/145  soft-tissue]
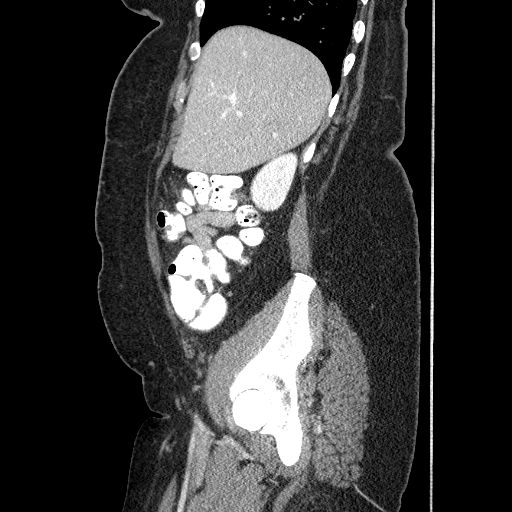
[im 64/145  soft-tissue]
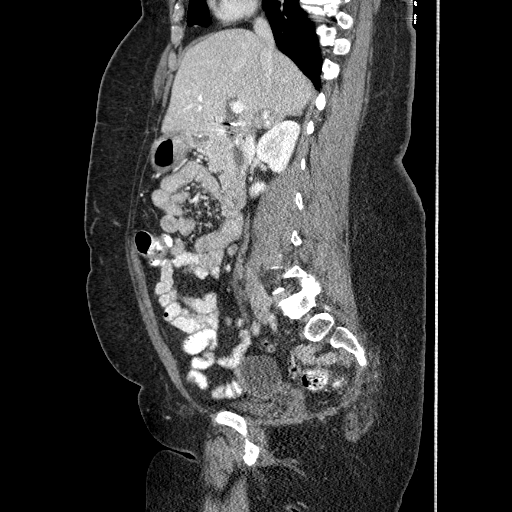

[12 of 36 positions shown; findings below may reference images not displayed]

FINDINGS: Lower chest: The lung bases are clear of acute process. No pleural
effusion or pulmonary lesions. The heart is normal in size. No
pericardial effusion. The distal esophagus and aorta are
unremarkable.

Hepatobiliary: No focal hepatic lesions or intrahepatic biliary
dilatation. The gallbladder is surgically absent. Mild associated
common bile duct dilatation, within normal limits.

Pancreas: Normal no ductal dilatation.

Spleen: Normal size.  No focal lesions.

Adrenals/Urinary Tract: The adrenal glands and kidneys are normal.
No renal or obstructing ureteral calculi. No renal mass.

Stomach/Bowel: The stomach, duodenum, small bowel and colon are
unremarkable. No inflammatory changes, mass lesions or obstructive
findings. Moderate stool noted throughout the colon. Mild colonic
diverticulosis. The terminal ileum is normal. The appendix is
normal.

Vascular/Lymphatic: No mesenteric or retroperitoneal mass or
adenopathy. A duplicated IVC is noted. The aorta is normal in
caliber. The branch vessels are patent.

Other: There is a 3.5 cm simple appearing cyst associated with the
right ovary. The left ovary is normal. The uterus is surgically
absent. The bladder is normal. No pelvic mass or adenopathy. No free
pelvic fluid collections. No inguinal mass or adenopathy.

Musculoskeletal: No significant bony findings.
IMPRESSION: 1. No acute abdominal/pelvic findings, mass lesions or adenopathy.
2. 3.5 cm simple appearing cyst associated with the right ovary.
3. Status post cholecystectomy with mild associated biliary
dilatation.

## 2014-04-06 MED ORDER — IOPAMIDOL (ISOVUE-300) INJECTION 61%
125.0000 mL | Freq: Once | INTRAVENOUS | Status: AC | PRN
  Administered 2014-04-06: 125 mL via INTRAVENOUS

## 2015-05-18 ENCOUNTER — Encounter: Payer: Self-pay | Admitting: Internal Medicine

## 2016-07-11 LAB — HM COLONOSCOPY

## 2016-09-14 DIAGNOSIS — I10 Essential (primary) hypertension: Secondary | ICD-10-CM | POA: Insufficient documentation

## 2016-09-14 LAB — TSH: TSH: 0.01 — AB (ref 0.41–5.90)

## 2016-09-14 LAB — HEPATIC FUNCTION PANEL
ALT: 18 (ref 7–35)
AST: 17 (ref 13–35)
Alkaline Phosphatase: 67 (ref 25–125)
Bilirubin, Total: 0.4

## 2016-09-14 LAB — VITAMIN B12: Vitamin B-12: 499

## 2016-09-14 LAB — BASIC METABOLIC PANEL
GLUCOSE: 107
POTASSIUM: 3.9 (ref 3.4–5.3)
SODIUM: 138 (ref 137–147)

## 2016-09-14 LAB — CBC AND DIFFERENTIAL
HEMATOCRIT: 36 (ref 36–46)
Hemoglobin: 11.6 — AB (ref 12.0–16.0)
Platelets: 274 (ref 150–399)
WBC: 5

## 2016-09-14 LAB — VITAMIN D 25 HYDROXY (VIT D DEFICIENCY, FRACTURES): Vit D, 25-Hydroxy: 50

## 2016-11-03 ENCOUNTER — Other Ambulatory Visit (HOSPITAL_COMMUNITY): Payer: Self-pay | Admitting: Endocrinology

## 2016-11-03 DIAGNOSIS — E0590 Thyrotoxicosis, unspecified without thyrotoxic crisis or storm: Secondary | ICD-10-CM

## 2016-11-23 ENCOUNTER — Encounter (HOSPITAL_COMMUNITY): Payer: BC Managed Care – PPO

## 2016-11-24 ENCOUNTER — Encounter (HOSPITAL_COMMUNITY): Payer: BC Managed Care – PPO

## 2016-11-27 ENCOUNTER — Encounter (HOSPITAL_COMMUNITY): Admission: RE | Admit: 2016-11-27 | Payer: BC Managed Care – PPO | Source: Ambulatory Visit

## 2016-11-28 ENCOUNTER — Encounter (HOSPITAL_COMMUNITY): Payer: BC Managed Care – PPO

## 2016-12-04 ENCOUNTER — Encounter (HOSPITAL_COMMUNITY)
Admission: RE | Admit: 2016-12-04 | Discharge: 2016-12-04 | Disposition: A | Discharge status: Home / Self Care | Payer: BC Managed Care – PPO | Source: Ambulatory Visit | Attending: Endocrinology | Admitting: Endocrinology

## 2016-12-04 DIAGNOSIS — E0590 Thyrotoxicosis, unspecified without thyrotoxic crisis or storm: Secondary | ICD-10-CM | POA: Diagnosis not present

## 2016-12-04 MED ORDER — SODIUM IODIDE I 131 CAPSULE
8.0000 | Freq: Once | INTRAVENOUS | Status: AC | PRN
  Administered 2016-12-04: 8 via ORAL

## 2016-12-05 ENCOUNTER — Encounter (HOSPITAL_COMMUNITY)
Admission: RE | Admit: 2016-12-05 | Discharge: 2016-12-05 | Disposition: A | Discharge status: Home / Self Care | Payer: BC Managed Care – PPO | Source: Ambulatory Visit | Attending: Endocrinology | Admitting: Endocrinology

## 2016-12-05 IMAGING — NM NM THYROID IMAGING W/ UPTAKE SINGLE (24 HR)
4 series · 4 of 4 positions shown · non-contrast
Comparison: None

CLINICAL DATA: Hyperthyroidism, heat intolerance, more moist skin,
hair loss, irritability, palpitations and fluttering, lethargy,
fatigue, decreased appetite

EXAM:
THYROID SCAN AND UPTAKE - 24 HOURS
TECHNIQUE: Following the per oral administration of [8K] sodium iodide, the
patient returned at 24 hours and uptake measurements were acquired
with the uptake probe centered on the neck. Thyroid imaging was
performed following the intravenous administration of the [8K]
Pertechnetate.
RADIOPHARMACEUTICALS:  7.931 MicroCuries [8K] sodium iodide orally
and 5.7 mCi [8K] pertechnetate IV

[th thyroid scan · 1.03mm/px · 1 of 1 slices shown (1 of 4)]
[im 1/1]
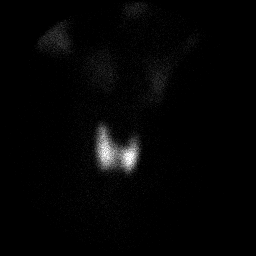

[th thyroid scan · 1.03mm/px · 1 of 1 slices shown (2 of 4)]
[im 1/1]
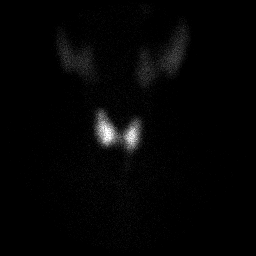

[th thyroid scan · 1.03mm/px · 1 of 1 slices shown (3 of 4)]
[im 1/1]
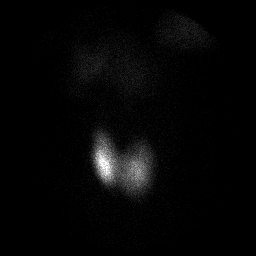

[th thyroid scan · 1.03mm/px · 1 of 1 slices shown (4 of 4)]
[im 1/1]
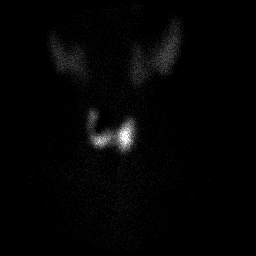

[4 of 4 positions shown; findings below may reference images not displayed]

FINDINGS: 24 hour radio iodine uptake calculated at 29%, within normal range.

Images of the thyroid gland in 3 projections are normal.

No focal areas of increased or decreased tracer localization.
IMPRESSION: Normal 24 hour radio iodine uptake of 29%.

Normal thyroid scan.

## 2016-12-05 MED ORDER — SODIUM PERTECHNETATE TC 99M INJECTION
9.7000 | Freq: Once | INTRAVENOUS | Status: AC | PRN
  Administered 2016-12-05: 9.7 via INTRAVENOUS

## 2016-12-11 ENCOUNTER — Encounter (INDEPENDENT_AMBULATORY_CARE_PROVIDER_SITE_OTHER): Payer: Self-pay | Admitting: Ophthalmology

## 2016-12-14 ENCOUNTER — Other Ambulatory Visit (HOSPITAL_COMMUNITY): Payer: Self-pay | Admitting: Endocrinology

## 2016-12-14 DIAGNOSIS — E0590 Thyrotoxicosis, unspecified without thyrotoxic crisis or storm: Secondary | ICD-10-CM

## 2016-12-29 ENCOUNTER — Encounter (HOSPITAL_COMMUNITY)
Admission: RE | Admit: 2016-12-29 | Discharge: 2016-12-29 | Disposition: A | Discharge status: Home / Self Care | Payer: BC Managed Care – PPO | Source: Ambulatory Visit | Attending: Endocrinology | Admitting: Endocrinology

## 2016-12-29 DIAGNOSIS — E0590 Thyrotoxicosis, unspecified without thyrotoxic crisis or storm: Secondary | ICD-10-CM | POA: Insufficient documentation

## 2016-12-29 LAB — HCG, SERUM, QUALITATIVE: Preg, Serum: POSITIVE — AB

## 2016-12-29 MED ORDER — SODIUM IODIDE I 131 CAPSULE
28.4000 | Freq: Once | INTRAVENOUS | Status: AC | PRN
  Administered 2016-12-29: 28.4 via ORAL

## 2017-01-29 ENCOUNTER — Ambulatory Visit: Payer: BC Managed Care – PPO | Admitting: Family Medicine

## 2017-01-29 ENCOUNTER — Encounter: Payer: Self-pay | Admitting: Family Medicine

## 2017-01-29 VITALS — BP 138/78 | HR 98 | Temp 98.6°F | Wt 214.8 lb

## 2017-01-29 DIAGNOSIS — L853 Xerosis cutis: Secondary | ICD-10-CM | POA: Diagnosis not present

## 2017-01-29 DIAGNOSIS — E559 Vitamin D deficiency, unspecified: Secondary | ICD-10-CM | POA: Diagnosis not present

## 2017-01-29 DIAGNOSIS — E010 Iodine-deficiency related diffuse (endemic) goiter: Secondary | ICD-10-CM

## 2017-01-29 DIAGNOSIS — E739 Lactose intolerance, unspecified: Secondary | ICD-10-CM

## 2017-01-29 DIAGNOSIS — M2550 Pain in unspecified joint: Secondary | ICD-10-CM | POA: Diagnosis not present

## 2017-01-29 DIAGNOSIS — J301 Allergic rhinitis due to pollen: Secondary | ICD-10-CM | POA: Diagnosis not present

## 2017-01-29 DIAGNOSIS — E669 Obesity, unspecified: Secondary | ICD-10-CM | POA: Diagnosis not present

## 2017-01-29 DIAGNOSIS — Z780 Asymptomatic menopausal state: Secondary | ICD-10-CM

## 2017-01-29 DIAGNOSIS — I10 Essential (primary) hypertension: Secondary | ICD-10-CM | POA: Diagnosis not present

## 2017-01-29 DIAGNOSIS — F5102 Adjustment insomnia: Secondary | ICD-10-CM | POA: Diagnosis not present

## 2017-01-29 MED ORDER — AMLODIPINE BESYLATE 5 MG PO TABS: 5.0000 mg | ORAL_TABLET | Freq: Every day | ORAL | 1 refills | Status: DC

## 2017-01-29 MED ORDER — TRIAMCINOLONE ACETONIDE 0.025 % EX OINT: 1.0000 "application " | TOPICAL_OINTMENT | Freq: Two times a day (BID) | CUTANEOUS | 0 refills | Status: DC

## 2017-01-29 NOTE — Progress Notes (Addendum)
Diane Alexander is a 57 y.o. female is here to [place].   Patient Care Team: Briscoe Deutscher, DO as PCP - General (Family Medicine)   History of Present Illness:   HPI: See Assessment and Plan section for Problem Based Charting of issues discussed today.  Health Maintenance Due  Topic Date Due  . Hepatitis C Screening  1960-06-11  . HIV Screening  05/29/1974  . TETANUS/TDAP  05/29/1978  . PAP SMEAR  05/28/1980  . MAMMOGRAM  10/04/2012  . COLONOSCOPY  05/24/2015  . INFLUENZA VACCINE  08/02/2016   No flowsheet data found.   PMHx, SurgHx, SocialHx, Medications, and Allergies were reviewed in the Visit Navigator and updated as appropriate.   Past Medical History:  Diagnosis Date  . Allergy   . Constipation   . Lumbar arthropathy    Past Surgical History:  Procedure Laterality Date  . BREAST CYST EXCISION    . CHOLECYSTECTOMY    . VAGINAL HYSTERECTOMY     Family History  Problem Relation Age of Onset  . Lymphoma Sister 60   Social History   Tobacco Use  . Smoking status: Never Smoker  Substance Use Topics  . Alcohol use: No  . Drug use: No   Current Medications and Allergies:   .  mometasone (NASONEX) 50 MCG/ACT nasal spray, 2 sprays by Nasal route daily.  , Disp: , Rfl:  .  naproxen (EC NAPROSYN) 500 MG EC tablet, Take 500 mg by mouth as needed.  , Disp: , Rfl:  .  OMNARIS 50 MCG/ACT nasal spray, 1 spray by Each Nare route as needed. , Disp: , Rfl:  .  peg 3350 powder (MOVIPREP) 100 G SOLR, Moviprep-take as directed, Disp: 1 kit, Rfl: 0  No Known Allergies   Review of Systems:   Pertinent items are noted in the HPI. Otherwise, ROS is negative.  Vitals:   Vitals:   01/29/17 0757  Weight: 214 lb 12.8 oz (97.4 kg)   See rooming.   Physical Exam:   Physical Exam  Constitutional: She is oriented to person, place, and time. She appears well-developed and well-nourished. No distress.  HENT:  Head: Normocephalic and atraumatic.  Right Ear: External  ear normal.  Left Ear: External ear normal.  Nose: Nose normal.  Mouth/Throat: Oropharynx is clear and moist.  Eyes: Conjunctivae and EOM are normal. Pupils are equal, round, and reactive to light.  Neck: Normal range of motion. Neck supple. No thyromegaly present.  Cardiovascular: Normal rate, regular rhythm, normal heart sounds and intact distal pulses.  Pulmonary/Chest: Effort normal and breath sounds normal.  Abdominal: Soft. Bowel sounds are normal.  Musculoskeletal: Normal range of motion.  Lymphadenopathy:    She has no cervical adenopathy.  Neurological: She is alert and oriented to person, place, and time.  Skin: Skin is warm and dry. Capillary refill takes less than 2 seconds.  Psychiatric: She has a normal mood and affect. Her behavior is normal.  Nursing note and vitals reviewed.  Assessment and Plan:   1. Thyromegaly Appointment with Dr. Chalmers Cater today.   2. Dry skin dermatitis Bilateral hands. No exposures. Ongoing issue x months. See AVS for instructions.   - triamcinolone (KENALOG) 0.025 % ointment; Apply 1 application topically 2 (two) times daily.  Dispense: 30 g; Refill: 0  3. Obesity (BMI 30-39.9) The patient is asked to make an attempt to improve diet and exercise patterns to aid in medical management of this problem.   4. Adjustment insomnia -  zolpidem (AMBIEN) 10 MG tablet; Take 10 mg by mouth at bedtime as needed for sleep.  5. Postmenopausal estrogen deficiency - estradiol (ESTRACE) 2 MG tablet; Take 2 mg by mouth daily.  6. Essential hypertension DC Lisinopril/HCTZ today.  - amLODipine (NORVASC) 5 MG tablet; Take 1 tablet (5 mg total) by mouth daily.  Dispense: 90 tablet; Refill: 1 - aspirin EC 81 MG tablet; Take 81 mg by mouth daily.  7. Seasonal allergic rhinitis due to pollen - cetirizine (ZYRTEC) 10 MG tablet; Take 10 mg by mouth daily.  8. Arthralgia of multiple joints Patient uses NSAIDs prn.   9. Vitamin D deficiency - calcium-vitamin D  (OSCAL WITH D) 500-200 MG-UNIT tablet; Take 1 tablet by mouth. - VITAMIN D 25 Hydroxy (Vit-D Deficiency, Fractures)  10. Lactose intolerance - Lactase (LACTAID PO); Take by mouth.   . Reviewed expectations re: course of current medical issues. . Discussed self-management of symptoms. . Outlined signs and symptoms indicating need for more acute intervention. . Patient verbalized understanding and all questions were answered. Marland Kitchen Health Maintenance issues including appropriate healthy diet, exercise, and smoking avoidance were discussed with patient. . See orders for this visit as documented in the electronic medical record. . Patient received an After Visit Summary.  Briscoe Deutscher, DO [place] 02/05/2017  Records requested if needed. Time spent with the patient: 30 minutes, of which >50% was spent in obtaining information about her symptoms, reviewing her previous labs, evaluations, and treatments, counseling her about her condition (please see the discussed topics above), and developing a plan to further investigate it; she had a number of questions which I addressed.

## 2017-01-29 NOTE — Patient Instructions (Signed)
It was so nice to meet you today!  I am looking forward to reading Dr. Almetta Lovely note.  Drink more water!  I want you to apply the ointment that I prescribed every other night. Alternate with Vaseline or another Aquaphor.

## 2017-01-31 ENCOUNTER — Other Ambulatory Visit: Payer: Self-pay | Admitting: Family Medicine

## 2017-01-31 NOTE — Telephone Encounter (Signed)
Norvasc and Kenalog sent to wrong pharmacy on 01/29/17. [facility] called and orders given for prescriptions written by Dr. Juleen China on 01/29/17. Pharmacy states the medications would be filled faster when called in vs retrieving the medications from [facility].   Left VM to inform pt medications were sent to correct pharmacy and to contact the pharmacy to see when the medications would be available for pick up.

## 2017-01-31 NOTE — Telephone Encounter (Signed)
Copied from [place]. Topic: Quick Communication - See Telephone Encounter >> Jan 31, 2017  4:23 PM Aurelio Brash B wrote: CRM for notification. See Telephone encounter for:  amLODipine (NORVASC) 5 MG tablet triamcinolone (KENALOG) 0.025 % ointment  [address] [place], [place] - [place] [phone] (Phone) [phone] (Fax)    01/31/17.

## 2017-02-02 ENCOUNTER — Encounter: Payer: Self-pay | Admitting: Family Medicine

## 2017-02-02 DIAGNOSIS — M2550 Pain in unspecified joint: Secondary | ICD-10-CM | POA: Insufficient documentation

## 2017-02-02 DIAGNOSIS — Z780 Asymptomatic menopausal state: Secondary | ICD-10-CM | POA: Insufficient documentation

## 2017-02-02 DIAGNOSIS — F5102 Adjustment insomnia: Secondary | ICD-10-CM | POA: Insufficient documentation

## 2017-02-02 DIAGNOSIS — E1159 Type 2 diabetes mellitus with other circulatory complications: Secondary | ICD-10-CM | POA: Insufficient documentation

## 2017-02-02 DIAGNOSIS — L853 Xerosis cutis: Secondary | ICD-10-CM | POA: Insufficient documentation

## 2017-02-02 DIAGNOSIS — E739 Lactose intolerance, unspecified: Secondary | ICD-10-CM | POA: Insufficient documentation

## 2017-02-02 DIAGNOSIS — E669 Obesity, unspecified: Secondary | ICD-10-CM | POA: Insufficient documentation

## 2017-02-02 DIAGNOSIS — E010 Iodine-deficiency related diffuse (endemic) goiter: Secondary | ICD-10-CM | POA: Insufficient documentation

## 2017-02-02 DIAGNOSIS — J301 Allergic rhinitis due to pollen: Secondary | ICD-10-CM | POA: Insufficient documentation

## 2017-02-02 DIAGNOSIS — E559 Vitamin D deficiency, unspecified: Secondary | ICD-10-CM | POA: Insufficient documentation

## 2017-02-02 DIAGNOSIS — I152 Hypertension secondary to endocrine disorders: Secondary | ICD-10-CM | POA: Insufficient documentation

## 2017-02-02 DIAGNOSIS — I10 Essential (primary) hypertension: Secondary | ICD-10-CM | POA: Insufficient documentation

## 2017-02-02 HISTORY — DX: Xerosis cutis: L85.3

## 2017-03-01 ENCOUNTER — Encounter: Payer: Self-pay | Admitting: Internal Medicine

## 2017-04-02 ENCOUNTER — Telehealth: Payer: Self-pay

## 2017-04-02 NOTE — Telephone Encounter (Signed)
Called patient  Not able to leave v/m memory full.

## 2017-04-02 NOTE — Telephone Encounter (Signed)
Copied from [place] [id number]. Topic: Inquiry >> Apr 02, 2017 12:36 PM Marin Olp L wrote: Reason for CRM: Has bruise on upper right arm and wants to know if naproxen 550mg  can cause it? She has been taking it for pain for something she say's Dr. Juleen China is aware of. Patient would like a call back from one of her cma's.

## 2017-04-12 LAB — SEROLOGY COMMENT

## 2017-04-13 ENCOUNTER — Ambulatory Visit: Payer: BC Managed Care – PPO | Admitting: Family Medicine

## 2017-04-16 ENCOUNTER — Ambulatory Visit: Payer: BC Managed Care – PPO | Admitting: Family Medicine

## 2017-04-16 ENCOUNTER — Encounter: Payer: Self-pay | Admitting: Family Medicine

## 2017-04-16 VITALS — BP 122/84 | HR 82 | Temp 97.7°F | Ht 64.0 in | Wt 224.6 lb

## 2017-04-16 DIAGNOSIS — M545 Low back pain, unspecified: Secondary | ICD-10-CM

## 2017-04-16 DIAGNOSIS — E890 Postprocedural hypothyroidism: Secondary | ICD-10-CM

## 2017-04-16 MED ORDER — TRAMADOL HCL 50 MG PO TABS: 50.0000 mg | ORAL_TABLET | Freq: Three times a day (TID) | ORAL | 0 refills | Status: DC | PRN

## 2017-04-16 MED ORDER — PREDNISONE 5 MG PO TABS: ORAL_TABLET | ORAL | 0 refills | Status: DC

## 2017-04-16 NOTE — Progress Notes (Signed)
Diane Alexander is a 57 y.o. female is here for AN ACUTE VISIT.   History of Present Illness:   Diane Alexander CMA acting as scribe for Diane Alexander.  HPI:  Back pain: Patient comes in today for back pain. She is having trouble walking due to the pain. She is having low back pain that is mainly on the left side. She has had muscle spasms with this. She is having leg pain as well. She is has some leg edema. She has not had an x ray of back. She has not been able to work. She has been having pain since the last weeks in March.  Thyroid: Patient has went to Dr. Chalmers Alexander due to having back pain. When she got labs her thyroid was in the 100 range. Diane Alexander has looked on patients labs with her on her patient portal.  Patient has been on levothyroxine for 2 weeks. Diane Alexander did explain to the patient it takes 6 weeks for the medication to help with the symptoms. We will recheck labs in 4 weeks.   Nodule: Patient has a cyst in the right upper arm. She has had for a couple weeks. It started with a bruise on the arm.   Health Maintenance Due  Topic Date Due  . Hepatitis C Screening  Jul 13, 1960  . HIV Screening  05/29/1974  . PAP SMEAR  05/28/1980  . MAMMOGRAM  10/04/2012  . COLONOSCOPY  05/24/2015   Depression screen PHQ 2/9 01/29/2017  Decreased Interest 0  Down, Depressed, Hopeless 0  PHQ - 2 Score 0   PMHx, SurgHx, SocialHx, FamHx, Medications, and Allergies were reviewed in the Visit Navigator and updated as appropriate.   Patient Active Problem List   Diagnosis Date Noted  . Thyromegaly 02/02/2017  . Dry skin dermatitis 02/02/2017  . Obesity (BMI 30-39.9) 02/02/2017  . Adjustment insomnia 02/02/2017  . Postmenopausal estrogen deficiency 02/02/2017  . Essential hypertension 02/02/2017  . Seasonal allergic rhinitis due to pollen 02/02/2017  . Arthralgia of multiple joints 02/02/2017  . Vitamin D deficiency 02/02/2017  . Lactose intolerance 02/02/2017   Social History   Tobacco  Use  . Smoking status: Never Smoker  . Smokeless tobacco: Never Used  Substance Use Topics  . Alcohol use: No  . Drug use: No   Current Medications and Allergies:   .  amLODipine (NORVASC) 5 MG tablet, Take 1 tablet (5 mg total) by mouth daily., Disp: 90 tablet, Rfl: 1 .  aspirin EC 81 MG tablet, Take 81 mg by mouth daily., Disp: , Rfl:  .  B Complex-Biotin-FA (SUPER B-100 PO), Take by mouth., Disp: , Rfl:  .  calcium carbonate (TUMS - DOSED IN MG ELEMENTAL CALCIUM) 500 MG chewable tablet, Chew 1 tablet by mouth daily., Disp: , Rfl:  .  calcium-vitamin D (OSCAL WITH D) 500-200 MG-UNIT tablet, Take 1 tablet by mouth., Disp: , Rfl:  .  cetirizine (ZYRTEC) 10 MG tablet, Take 10 mg by mouth daily., Disp: , Rfl:  .  COLLAGEN PO, Take by mouth., Disp: , Rfl:  .  estradiol (ESTRACE) 2 MG tablet, Take 2 mg by mouth daily., Disp: , Rfl:  .  Ferrous Gluconate-C-Folic Acid (IRON-C PO), Take by mouth., Disp: , Rfl:  .  Lactase (LACTAID PO), Take by mouth., Disp: , Rfl:  .  mometasone (NASONEX) 50 MCG/ACT nasal spray, 2 sprays by Nasal route daily.  , Disp: , Rfl:  .  polyethylene glycol (MIRALAX / GLYCOLAX) packet, Take  17 g by mouth daily., Disp: , Rfl:  .  Probiotic Product (PROBIOTIC-10 PO), Take by mouth., Disp: , Rfl:  .  triamcinolone (KENALOG) 0.025 % ointment, Apply 1 application topically 2 (two) times daily., Disp: 30 g, Rfl: 0 .  vitamin C (ASCORBIC ACID) 500 MG tablet, Take 500 mg by mouth daily., Disp: , Rfl:  .  zolpidem (AMBIEN) 10 MG tablet, Take 10 mg by mouth at bedtime as needed for sleep., Disp: , Rfl:   No Known Allergies   Review of Systems   Pertinent items are noted in the HPI. Otherwise, ROS is negative.  Vitals:   Vitals:   04/16/17 1503  BP: 122/84  Pulse: 82  Temp: 97.7 F (36.5 C)  TempSrc: Oral  SpO2: 99%  Weight: 224 lb 9.6 oz (101.9 kg)  Height: 5\' 4"  (1.626 m)     Body mass index is 38.55 kg/m.   Physical Exam:   Physical Exam  Constitutional:  She is oriented to person, place, and time. She appears well-developed and well-nourished. No distress.  HENT:  Head: Normocephalic and atraumatic.  Right Ear: External ear normal.  Left Ear: External ear normal.  Nose: Nose normal.  Mouth/Throat: Oropharynx is clear and moist.  Eyes: Pupils are equal, round, and reactive to light. Conjunctivae and EOM are normal.  Neck: Normal range of motion. Neck supple. No thyromegaly present.  Cardiovascular: Normal rate, regular rhythm, normal heart sounds and intact distal pulses.  Pulmonary/Chest: Effort normal and breath sounds normal.  Abdominal: Soft. Bowel sounds are normal.  Musculoskeletal: Normal range of motion.       Lumbar back: She exhibits bony tenderness and spasm.  Lymphadenopathy:    She has no cervical adenopathy.  Neurological: She is alert and oriented to person, place, and time.  Skin: Skin is warm and dry. Capillary refill takes less than 2 seconds.  Psychiatric: She has a normal mood and affect. Her behavior is normal.  Nursing note and vitals reviewed.   Assessment and Plan:   Diane Alexander was seen today for back pain.  Diagnoses and all orders for this visit:  Postablative hypothyroidism Comments: Lab recheck of thyroid in 4 weeks. Orders: -     T4, free; Future -     TSH; Future  Acute midline low back pain without sciatica Comments: Acute on chronic issue.  Prednisone is trial for degenerative changes. Orders: -     predniSONE (DELTASONE) 5 MG tablet; Take 6,5,4,3,2,1 done -     traMADol (ULTRAM) 50 MG tablet; Take 1 tablet (50 mg total) by mouth every 8 (eight) hours as needed.   . Reviewed expectations re: course of current medical issues. . Discussed self-management of symptoms. . Outlined signs and symptoms indicating need for more acute intervention. . Patient verbalized understanding and all questions were answered. Marland Kitchen Health Maintenance issues including appropriate healthy diet, exercise, and smoking  avoidance were discussed with patient. . See orders for this visit as documented in the electronic medical record. . Patient received an After Visit Summary.  Briscoe Deutscher, DO [place] 04/16/2017  Future Appointments  Date Time Provider [place]  05/14/2017  7:40 AM Briscoe Deutscher, DO [facility]

## 2017-04-23 ENCOUNTER — Encounter: Payer: Self-pay | Admitting: Family Medicine

## 2017-04-23 NOTE — Telephone Encounter (Signed)
Please advise 

## 2017-04-24 ENCOUNTER — Telehealth: Payer: Self-pay | Admitting: Family Medicine

## 2017-04-24 MED ORDER — NAPROXEN 500 MG PO TABS: 500.0000 mg | ORAL_TABLET | Freq: Two times a day (BID) | ORAL | 0 refills | Status: DC

## 2017-04-24 NOTE — Telephone Encounter (Signed)
Copied from [place] [id number]. Topic: Inquiry >> Apr 24, 2017  3:17 PM Oliver Pila B wrote: Reason for CRM: pt called and states she is still having the same issues of her last visit w/ Dr. Juleen China; pt states she has finished the prednisone and is unsure of what else to do b/c she is experiencing the same issues, contact pt to advise

## 2017-04-24 NOTE — Telephone Encounter (Signed)
Please advise 

## 2017-04-25 NOTE — Telephone Encounter (Signed)
Left message to return call to our office.  CRM Started ok to give message.   

## 2017-04-25 NOTE — Telephone Encounter (Signed)
Error, sent to Corrin Parker call

## 2017-05-07 ENCOUNTER — Encounter: Payer: Self-pay | Admitting: Family Medicine

## 2017-05-14 ENCOUNTER — Ambulatory Visit: Payer: Self-pay | Admitting: Family Medicine

## 2017-06-14 NOTE — Progress Notes (Signed)
Not seen. To [place].

## 2017-06-15 ENCOUNTER — Encounter: Payer: Self-pay | Admitting: Family Medicine

## 2017-06-15 ENCOUNTER — Ambulatory Visit: Payer: Self-pay

## 2017-06-15 ENCOUNTER — Ambulatory Visit (INDEPENDENT_AMBULATORY_CARE_PROVIDER_SITE_OTHER): Payer: Self-pay | Admitting: Family Medicine

## 2017-06-15 ENCOUNTER — Ambulatory Visit: Payer: Self-pay | Admitting: Sports Medicine

## 2017-06-15 VITALS — BP 126/88 | HR 84 | Ht 64.0 in | Wt 225.0 lb

## 2017-06-15 VITALS — BP 126/88 | HR 84 | Temp 98.0°F | Ht 64.0 in | Wt 225.0 lb

## 2017-06-15 DIAGNOSIS — M722 Plantar fascial fibromatosis: Secondary | ICD-10-CM

## 2017-06-15 DIAGNOSIS — M79673 Pain in unspecified foot: Secondary | ICD-10-CM

## 2017-06-15 DIAGNOSIS — M79672 Pain in left foot: Secondary | ICD-10-CM

## 2017-06-15 DIAGNOSIS — G8929 Other chronic pain: Secondary | ICD-10-CM

## 2017-06-15 NOTE — Progress Notes (Signed)
Diane Alexander. Diane Alexander, [place] [facility] [place] - 57 y.o. female MRN [id number]  Date of birth: 1960/08/17  Visit Date: 06/15/2017  PCP: Briscoe Deutscher, DO   Referred by: Briscoe Deutscher, DO  Scribe(s) for today's visit: Wendy Poet, LAT, ATC  SUBJECTIVE:  Diane Alexander is here for New Patient (Initial Visit) (L foot pain) .  Referred by: Dr. Juleen China  Her L foot (heel) pain symptoms INITIALLY: Began a couple months ago w/ no known MOI Described as severe sharp pain, radiating to L LE Worsened with weight bearing Improved with nothing noted Additional associated symptoms include: notes N/T in B feet    At this time symptoms are worsening compared to onset w/ increased pain. She has been trying ice and heat w/ no relief.  She's tried compression and shoe inserts. She's tried Tramadol and prednisone w/ no relief.   REVIEW OF SYSTEMS: Denies night time disturbances. Reports fevers, chills, or night sweats.  Yes to night sweats. Denies unexplained weight loss. Denies personal history of cancer. Denies changes in bowel or bladder habits.  Problems w/ constipation. Denies recent unreported falls. Denies new or worsening dyspnea or wheezing. Reports headaches or dizziness.   Yes to dizziness Reports numbness, tingling or weakness  In the extremities in B feet Denies dizziness or presyncopal episodes Denies lower extremity edema    HISTORY & PERTINENT PRIOR DATA:  Prior History reviewed and updated per electronic medical record.  Significant/pertinent history, findings, studies include:  reports that she has never smoked. She has never used smokeless tobacco. No results for input(s): HGBA1C, LABURIC, CREATINE in the last 8760 hours. The ASCVD Risk score Mikey Bussing DC Jr., et al., 2013) failed to calculate for the following reasons:   Cannot find a previous HDL lab   Cannot find a previous total cholesterol lab No  problems updated.  OBJECTIVE:  VS:  HT:5\' 4"  (162.6 cm)   WT:225 lb (102.1 kg)  BMI:38.6    BP:126/88  HR:84bpm  TEMP: ( )  RESP:97 %   PHYSICAL EXAM: CONSTITUTIONAL: Well-developed, Well-nourished and In no acute distress Alert & appropriately interactive. and Not depressed or anxious appearing. Respiratory: No increased work of breathing.  Trachea Midline EYES: Pupils are equal., EOM intact without nystagmus. and No scleral icterus.  Lower extremities: Warm and well perfused NEURO: unremarkable, Normal associated myotomal distribution strength to manual muscle testing, Normal sensation to light touch  MSK Exam: LEFT HEEL: . Well aligned, no significant deformity. . No overlying skin changes. . TTP over Origin of the plantar fascia, nontender over the base of the fifth metatarsal, medial or lateral malleolus.  No focal tenderness over the peroneal tendons. . Dorsiflexion limited to 95 degrees . Ligamentously stable to Anterior drawer testing, talar tilting and Kleiger.   PROCEDURES & DATA REVIEWED:  Per Procedure notes  ASSESSMENT   1. Chronic heel pain, left   2. Plantar fascia syndrome     PLAN:  Alfredson exercises with additional home exercises reviewed per procedure note Longitudinal arch support recommended begin with over-the-counter options Frequent icing Appropriate activity modifications excellent 6-week follow-up for clinical reevaluation and consideration of further advanced imaging.  No problem-specific Assessment & Plan notes found for this encounter.  Orders Placed This Encounter  Procedures  . [place] MSK POCT ULTRASOUND  No orders of the defined types were placed in this encounter.    Follow-up: Return in about 6 weeks (around 07/27/2017).  Please see additional documentation for Objective, Assessment and Plan sections. Pertinent additional documentation may be included in corresponding procedure notes, imaging studies, problem based  documentation and patient instructions. Please see these sections of the encounter for additional information regarding this visit.  CMA/ATC served as Education administrator during this visit. History, Physical, and Plan performed by medical provider. Documentation and orders reviewed and attested to.      Gerda Diss, [facility] Physician

## 2017-06-15 NOTE — Progress Notes (Signed)
    PROCEDURE NOTE: THERAPEUTIC EXERCISES (97110) 15 minutes spent for Therapeutic exercises as below and as referenced in the AVS.  This included exercises focusing on stretching, strengthening, with significant focus on eccentric aspects.   Proper technique shown and discussed handout in great detail with ATC.  All questions were discussed and answered.   Schollmeyer term goals include an improvement in range of motion, strength, endurance as well as avoiding reinjury. Frequency of visits is one time as determined during today's  office visit. Frequency of exercises to be performed is as per handout.  EXERCISES REVIEWED: Alfredson Protocol Plantar Fascia Stretch  cool water soaks - achilles stretching - STRUTz + Dr. Cydney Ok PF insoles

## 2017-06-15 NOTE — Procedures (Addendum)
LIMITED MSK ULTRASOUND OF Left heel Images were obtained and interpreted by myself, Teresa Coombs, DO  Images have been saved and stored to PACS system. Images obtained on: GE S7 Ultrasound machine  FINDINGS:   PF measures 0.62cm in diameter Harkleroad arch enlarged as well  IMPRESSION:  1. Plantar fasciitis

## 2017-06-15 NOTE — Patient Instructions (Signed)
Please perform the exercise program that we have prepared for you and gone over in detail on a daily basis.  In addition to the handout you were provided you can access your program through: [email or web address]   Your unique program code is: [phone]

## 2017-06-21 ENCOUNTER — Telehealth: Payer: Self-pay | Admitting: Family Medicine

## 2017-06-21 NOTE — Telephone Encounter (Signed)
Returned pt's call and her questions concerning her OTC shoe inserts.

## 2017-06-21 NOTE — Telephone Encounter (Signed)
The patient walked in wanting to know if she was using the bands that were recommended to her correctly. They have caused her some discomfort. She would like someone to call her so can make sure she is using them the right way.

## 2017-07-08 ENCOUNTER — Encounter: Payer: Self-pay | Admitting: Family Medicine

## 2017-07-10 ENCOUNTER — Telehealth: Payer: Self-pay | Admitting: Family Medicine

## 2017-07-10 NOTE — Telephone Encounter (Signed)
See note.   Copied from [place] [id number]. Topic: General - Other >> Jul 10, 2017  2:16 PM Williams-Neal, Sade R wrote: Pt is calling in requesting to be placed back on the Losartan.   CB #[phone]

## 2017-07-10 NOTE — Telephone Encounter (Signed)
Pt is requesting to be placed back on Losartan.

## 2017-07-10 NOTE — Telephone Encounter (Signed)
Please advise 

## 2017-07-10 NOTE — Telephone Encounter (Signed)
Copied from [place] [id number]. Topic: Quick Communication - Rx Refill/Question >> Jul 10, 2017  2:13 PM Waldemar Dickens, Sade R wrote: Medication: amLODipine (NORVASC) 5 MG tablet ,   Has the patient contacted their pharmacy? Yes (Agent: If no, request that the patient contact the pharmacy for the refill.) (Agent: If yes, when and what did the pharmacy advise?)  Preferred Pharmacy (with phone number or street name): [facility] [place] [phone] (Phone) [phone] (Fax)      Agent: Please be advised that RX refills may take up to 3 business days. We ask that you follow-up with your pharmacy.

## 2017-07-13 NOTE — Telephone Encounter (Signed)
Come in for visit to discuss medication change.

## 2017-07-16 NOTE — Telephone Encounter (Signed)
My chart message sent to make app.

## 2017-07-27 ENCOUNTER — Ambulatory Visit: Payer: Self-pay | Admitting: Sports Medicine

## 2017-07-27 DIAGNOSIS — Z0289 Encounter for other administrative examinations: Secondary | ICD-10-CM

## 2017-08-02 ENCOUNTER — Encounter: Payer: Self-pay | Admitting: Sports Medicine

## 2017-08-19 ENCOUNTER — Encounter: Payer: Self-pay | Admitting: Sports Medicine

## 2017-09-24 DIAGNOSIS — E890 Postprocedural hypothyroidism: Secondary | ICD-10-CM | POA: Diagnosis not present

## 2017-09-27 DIAGNOSIS — I10 Essential (primary) hypertension: Secondary | ICD-10-CM | POA: Diagnosis not present

## 2017-09-27 DIAGNOSIS — E890 Postprocedural hypothyroidism: Secondary | ICD-10-CM | POA: Diagnosis not present

## 2017-10-02 ENCOUNTER — Encounter

## 2017-10-02 ENCOUNTER — Encounter: Payer: Self-pay | Admitting: Family Medicine

## 2017-10-02 ENCOUNTER — Ambulatory Visit: Payer: 59 | Admitting: Family Medicine

## 2017-10-02 VITALS — BP 142/86 | HR 83 | Temp 98.2°F | Ht 64.0 in | Wt 224.6 lb

## 2017-10-02 DIAGNOSIS — R5383 Other fatigue: Secondary | ICD-10-CM | POA: Diagnosis not present

## 2017-10-02 DIAGNOSIS — E890 Postprocedural hypothyroidism: Secondary | ICD-10-CM

## 2017-10-02 DIAGNOSIS — F5102 Adjustment insomnia: Secondary | ICD-10-CM

## 2017-10-02 DIAGNOSIS — R5381 Other malaise: Secondary | ICD-10-CM | POA: Diagnosis not present

## 2017-10-02 DIAGNOSIS — M2550 Pain in unspecified joint: Secondary | ICD-10-CM | POA: Diagnosis not present

## 2017-10-02 DIAGNOSIS — N951 Menopausal and female climacteric states: Secondary | ICD-10-CM

## 2017-10-02 DIAGNOSIS — R52 Pain, unspecified: Secondary | ICD-10-CM | POA: Diagnosis not present

## 2017-10-02 DIAGNOSIS — E669 Obesity, unspecified: Secondary | ICD-10-CM

## 2017-10-02 DIAGNOSIS — I10 Essential (primary) hypertension: Secondary | ICD-10-CM | POA: Diagnosis not present

## 2017-10-02 LAB — CBC WITH DIFFERENTIAL/PLATELET
Basophils Absolute: 0 10*3/uL (ref 0.0–0.1)
Basophils Relative: 0.5 % (ref 0.0–3.0)
Eosinophils Absolute: 0.1 10*3/uL (ref 0.0–0.7)
Eosinophils Relative: 2.4 % (ref 0.0–5.0)
HCT: 39.5 % (ref 36.0–46.0)
Hemoglobin: 13 g/dL (ref 12.0–15.0)
Lymphocytes Relative: 32.1 % (ref 12.0–46.0)
Lymphs Abs: 1.4 10*3/uL (ref 0.7–4.0)
MCHC: 32.8 g/dL (ref 30.0–36.0)
MCV: 84.1 fl (ref 78.0–100.0)
Monocytes Absolute: 0.3 10*3/uL (ref 0.1–1.0)
Monocytes Relative: 7.4 % (ref 3.0–12.0)
Neutro Abs: 2.6 10*3/uL (ref 1.4–7.7)
Neutrophils Relative %: 57.6 % (ref 43.0–77.0)
Platelets: 248 10*3/uL (ref 150.0–400.0)
RBC: 4.7 Mil/uL (ref 3.87–5.11)
RDW: 14.1 % (ref 11.5–15.5)
WBC: 4.5 10*3/uL (ref 4.0–10.5)

## 2017-10-02 LAB — COMPREHENSIVE METABOLIC PANEL
ALT: 29 U/L (ref 0–35)
AST: 22 U/L (ref 0–37)
Albumin: 4.1 g/dL (ref 3.5–5.2)
Alkaline Phosphatase: 73 U/L (ref 39–117)
BUN: 13 mg/dL (ref 6–23)
CO2: 32 mEq/L (ref 19–32)
Calcium: 9.5 mg/dL (ref 8.4–10.5)
Chloride: 103 mEq/L (ref 96–112)
Creatinine, Ser: 0.74 mg/dL (ref 0.40–1.20)
GFR: 103.54 mL/min (ref >60.00)
Glucose, Bld: 108 mg/dL — ABNORMAL HIGH (ref 70–99)
Potassium: 4.3 mEq/L (ref 3.5–5.1)
Sodium: 141 mEq/L (ref 135–145)
Total Bilirubin: 0.5 mg/dL (ref 0.2–1.2)
Total Protein: 7.5 g/dL (ref 6.0–8.3)

## 2017-10-02 LAB — LUTEINIZING HORMONE: LH: 47.19 m[IU]/mL

## 2017-10-02 LAB — HEMOGLOBIN A1C: Hgb A1c MFr Bld: 5.5 % (ref 4.6–6.5)

## 2017-10-02 LAB — SEDIMENTATION RATE: Sed Rate: 18 mm/hr (ref 0–30)

## 2017-10-02 LAB — FOLLICLE STIMULATING HORMONE: FSH: 110.7 m[IU]/mL

## 2017-10-02 LAB — C-REACTIVE PROTEIN: CRP: 1.2 mg/dL (ref 0.5–20.0)

## 2017-10-02 MED ORDER — VENLAFAXINE HCL ER 37.5 MG PO CP24: 37.5000 mg | ORAL_CAPSULE | Freq: Every day | ORAL | 0 refills | Status: DC

## 2017-10-02 MED ORDER — TRAZODONE HCL 50 MG PO TABS: 25.0000 mg | ORAL_TABLET | Freq: Every evening | ORAL | 3 refills | Status: DC | PRN

## 2017-10-02 NOTE — Progress Notes (Signed)
Diane Alexander is a 57 y.o. female here for an acute visit.  History of Present Illness:   Shaune Pascal CMA acting as scribe for Dr. Juleen China.  HPI: Patient comes in today for body aches. Patient is very frustrated due lack of diagnosis for continued malaise.  Diabetes: Patient stated she would like to be checked for diabetes. Patient has been having generalized body aches. When the patient gets up she has trouble getting up out of her seat.  Left Hip Pain: Patient states that she has been having left leg and hip pain all the time. She has been having this for over 5 years.   Anxiety: Dr. Juleen China discussed anxiety medication with the patient. Patient is hesitant in starting anxiety medication. Patient denies having anxiety. Dr. Juleen China said that anxiety comes out as frustration sometimes.  Weight Loss: Patient is wanting to lose weight. Patient said that she has had some diet changes. Dr. Juleen China discussed several medications to help with the weight loss.   PMHx, SurgHx, SocialHx, Medications, and Allergies were reviewed in the Visit Navigator and updated as appropriate.  Current Medications:   .  amLODipine (NORVASC) 5 MG tablet, Take 1 tablet (5 mg total) by mouth daily., Disp: 90 tablet, Rfl: 1 .  aspirin EC 81 MG tablet, Take 81 mg by mouth daily., Disp: , Rfl:  .  B Complex-Biotin-FA (SUPER B-100 PO), Take by mouth., Disp: , Rfl:  .  calcium carbonate (TUMS - DOSED IN MG ELEMENTAL CALCIUM) 500 MG chewable tablet, Chew 1 tablet by mouth daily., Disp: , Rfl:  .  calcium-vitamin D (OSCAL WITH D) 500-200 MG-UNIT tablet, Take 1 tablet by mouth., Disp: , Rfl:  .  cetirizine (ZYRTEC) 10 MG tablet, Take 10 mg by mouth daily., Disp: , Rfl:  .  COLLAGEN PO, Take by mouth., Disp: , Rfl:  .  estradiol (ESTRACE) 2 MG tablet, Take 2 mg by mouth daily., Disp: , Rfl:  .  Ferrous Gluconate-C-Folic Acid (IRON-C PO), Take by mouth., Disp: , Rfl:  .  Lactase (LACTAID PO), Take by mouth., Disp: , Rfl:   .  levothyroxine (SYNTHROID, LEVOTHROID) 100 MCG tablet, , Disp: , Rfl: 3 .  mometasone (NASONEX) 50 MCG/ACT nasal spray, 2 sprays by Nasal route daily.  , Disp: , Rfl:  .  naproxen (NAPROSYN) 500 MG tablet, Take 1 tablet (500 mg total) by mouth 2 (two) times daily with a meal., Disp: 30 tablet, Rfl: 0 .  polyethylene glycol (MIRALAX / GLYCOLAX) packet, Take 17 g by mouth daily., Disp: , Rfl:  .  Probiotic Product (PROBIOTIC-10 PO), Take by mouth., Disp: , Rfl:  .  triamcinolone (KENALOG) 0.025 % ointment, Apply 1 application topically 2 (two) times daily., Disp: 30 g, Rfl: 0 .  vitamin C (ASCORBIC ACID) 500 MG tablet, Take 500 mg by mouth daily., Disp: , Rfl:   No Known Allergies   Review of Systems:   Pertinent items are noted in the HPI. Otherwise, ROS is negative.  Vitals:   Vitals:   10/02/17 1011  BP: (!) 142/86  Pulse: 83  Temp: 98.2 F (36.8 C)  TempSrc: Oral  SpO2: 95%  Weight: 224 lb 9.6 oz (101.9 kg)  Height: 5\' 4"  (1.626 m)     Body mass index is 38.55 kg/m.  Physical Exam:   Physical Exam  Constitutional: She is oriented to person, place, and time. She appears well-developed and well-nourished. No distress.  HENT:  Head: Normocephalic and atraumatic.  Right  Ear: External ear normal.  Left Ear: External ear normal.  Nose: Nose normal.  Mouth/Throat: Oropharynx is clear and moist.  Eyes: Pupils are equal, round, and reactive to light. Conjunctivae and EOM are normal.  Neck: Normal range of motion. Neck supple. No thyromegaly present.  Cardiovascular: Normal rate, regular rhythm, normal heart sounds and intact distal pulses.  Pulmonary/Chest: Effort normal and breath sounds normal.  Abdominal: Soft. Bowel sounds are normal.  Musculoskeletal: Normal range of motion.  Lymphadenopathy:    She has no cervical adenopathy.  Neurological: She is alert and oriented to person, place, and time.  Skin: Skin is warm and dry. Capillary refill takes less than 2 seconds.   Psychiatric: She has a normal mood and affect. Her behavior is normal.  Nursing note and vitals reviewed.  Results for orders placed or performed in visit on 10/02/17  CBC with Differential/Platelet  Result Value Ref Range   WBC 4.5 4.0 - 10.5 K/uL   RBC 4.70 3.87 - 5.11 Mil/uL   Hemoglobin 13.0 12.0 - 15.0 g/dL   HCT 39.5 36.0 - 46.0 %   MCV 84.1 78.0 - 100.0 fl   MCHC 32.8 30.0 - 36.0 g/dL   RDW 14.1 11.5 - 15.5 %   Platelets 248.0 150.0 - 400.0 K/uL   Neutrophils Relative % 57.6 43.0 - 77.0 %   Lymphocytes Relative 32.1 12.0 - 46.0 %   Monocytes Relative 7.4 3.0 - 12.0 %   Eosinophils Relative 2.4 0.0 - 5.0 %   Basophils Relative 0.5 0.0 - 3.0 %   Neutro Abs 2.6 1.4 - 7.7 K/uL   Lymphs Abs 1.4 0.7 - 4.0 K/uL   Monocytes Absolute 0.3 0.1 - 1.0 K/uL   Eosinophils Absolute 0.1 0.0 - 0.7 K/uL   Basophils Absolute 0.0 0.0 - 0.1 K/uL  Comprehensive metabolic panel  Result Value Ref Range   Sodium 141 135 - 145 mEq/L   Potassium 4.3 3.5 - 5.1 mEq/L   Chloride 103 96 - 112 mEq/L   CO2 32 19 - 32 mEq/L   Glucose, Bld 108 (H) 70 - 99 mg/dL   BUN 13 6 - 23 mg/dL   Creatinine, Ser 0.74 0.40 - 1.20 mg/dL   Total Bilirubin 0.5 0.2 - 1.2 mg/dL   Alkaline Phosphatase 73 39 - 117 U/L   AST 22 0 - 37 U/L   ALT 29 0 - 35 U/L   Total Protein 7.5 6.0 - 8.3 g/dL   Albumin 4.1 3.5 - 5.2 g/dL   Calcium 9.5 8.4 - 10.5 mg/dL   GFR 103.54 >60.00 mL/min  Sedimentation rate  Result Value Ref Range   Sed Rate 18 0 - 30 mm/hr  Hemoglobin A1c  Result Value Ref Range   Hgb A1c MFr Bld 5.5 4.6 - 6.5 %  Follicle stimulating hormone  Result Value Ref Range   FSH 110.7 mIU/ML  Luteinizing hormone  Result Value Ref Range   LH 47.19 mIU/mL  Rheumatoid factor  Result Value Ref Range   Rhuematoid fact SerPl-aCnc <14 <14 IU/mL  C-reactive protein  Result Value Ref Range   CRP 1.2 0.5 - 20.0 mg/dL   Assessment and Plan:   Deoni was seen today for generalized body aches.  Diagnoses and all  orders for this visit:  Malaise and fatigue Comments: Likely multifactorial. Hypothyroidism, menopausal, sleep disorder, obesity. Will work on all. Orders: -     CBC with Differential/Platelet -     Comprehensive metabolic panel -  Hemoglobin A1c  Morbid obesity ([place]) Comments: Patient wants to work on weight loss. Will start treatment for pain, vasomotor symptoms, situational anxiety, and sleep today. Recheck in one month. Orders: -     Hemoglobin A1c  Vasomotor symptoms due to menopause Comments: Will see if Effexor is helpful. Follow up in one month. Orders: -     Follicle stimulating hormone -     Luteinizing hormone -     venlafaxine XR (EFFEXOR XR) 37.5 MG 24 hr capsule; Take 1 capsule (37.5 mg total) by mouth daily with breakfast.  Body aches  Adjustment insomnia Comments: Previously Rx Ambien by Dr. Ubaldo Glassing, OBGYN. Stop Ambien (she has not been taking anyway). Rx Trazodone as below. Consider sleep study. Orders: -     traZODone (DESYREL) 50 MG tablet; Take 0.5-1 tablets (25-50 mg total) by mouth at bedtime as needed for sleep.  Essential hypertension Comments: Slightly elevated today but patient definitely frustruated today. Will hold on dose adjustment.   Arthralgia of multiple joints Comments: Labs today to screen for autoimmune disease. Orders: -     Sedimentation rate -     ANA -     Rheumatoid factor -     C-reactive protein  Postablative hypothyroidism Comments: Followed by Dr. Chalmers Cater. Patient states that she is on brand name Synthroid but could not remember the dose. Will request records.    . Reviewed expectations re: course of current medical issues. . Discussed self-management of symptoms. . Outlined signs and symptoms indicating need for more acute intervention. . Patient verbalized understanding and all questions were answered. Marland Kitchen Health Maintenance issues including appropriate healthy diet, exercise, and smoking avoidance were discussed with  patient. . See orders for this visit as documented in the electronic medical record. . Patient received an After Visit Summary.  CMA served as Education administrator during this visit. History, Physical, and Plan performed by medical provider. The above documentation has been reviewed and is accurate and complete. Briscoe Deutscher, D.O.  Briscoe Deutscher, DO [place], [facility] 10/03/2017

## 2017-10-03 ENCOUNTER — Encounter: Payer: Self-pay | Admitting: Family Medicine

## 2017-10-03 ENCOUNTER — Ambulatory Visit: Payer: Self-pay | Admitting: Family Medicine

## 2017-10-04 ENCOUNTER — Telehealth: Payer: Self-pay | Admitting: Family Medicine

## 2017-10-04 LAB — RHEUMATOID FACTOR: Rhuematoid fact SerPl-aCnc: <14 IU/mL (ref <14)

## 2017-10-04 LAB — ANA: Anti Nuclear Antibody(ANA): NEGATIVE

## 2017-10-04 NOTE — Telephone Encounter (Signed)
See note

## 2017-10-04 NOTE — Telephone Encounter (Signed)
Copied from [place] [id number]. Topic: Quick Communication - See Telephone Encounter >> Oct 04, 2017  2:21 PM Vernona Rieger wrote: CRM for notification. See Telephone encounter for: 10/04/17.  Patient would like the nurse to call her back regarding her labs results.

## 2017-10-04 NOTE — Telephone Encounter (Signed)
See results notes. 

## 2017-10-08 ENCOUNTER — Other Ambulatory Visit: Payer: Self-pay

## 2017-10-08 DIAGNOSIS — Z1231 Encounter for screening mammogram for malignant neoplasm of breast: Secondary | ICD-10-CM | POA: Diagnosis not present

## 2017-10-08 DIAGNOSIS — Z01419 Encounter for gynecological examination (general) (routine) without abnormal findings: Secondary | ICD-10-CM | POA: Diagnosis not present

## 2017-10-08 DIAGNOSIS — Z6838 Body mass index (BMI) 38.0-38.9, adult: Secondary | ICD-10-CM | POA: Diagnosis not present

## 2017-10-08 LAB — HM MAMMOGRAPHY

## 2017-10-08 MED ORDER — LOSARTAN POTASSIUM-HCTZ 100-25 MG PO TABS: 1.0000 | ORAL_TABLET | Freq: Every day | ORAL | 3 refills | Status: DC

## 2017-10-09 LAB — HM PAP SMEAR: HM PAP: NEGATIVE

## 2017-10-29 NOTE — Progress Notes (Signed)
Diane Alexander is a 57 y.o. female is here for follow up.  History of Present Illness:   HPI: See Assessment and Plan section for Problem Based Charting of issues discussed today.   Health Maintenance Due  Topic Date Due  . Hepatitis C Screening  [birth year]  . HIV Screening  05/29/1974  . MAMMOGRAM  10/04/2012  . COLONOSCOPY  05/24/2015  . INFLUENZA VACCINE  08/02/2017   Depression screen PHQ 2/9 01/29/2017  Decreased Interest 0  Down, Depressed, Hopeless 0  PHQ - 2 Score 0   PMHx, SurgHx, SocialHx, FamHx, Medications, and Allergies were reviewed in the Visit Navigator and updated as appropriate.   Patient Active Problem List   Diagnosis Date Noted  . OA (osteoarthritis) of knee, bilateral 10/31/2017  . Vasomotor symptoms due to menopause 10/31/2017  . Thyromegaly 02/02/2017  . Dry skin dermatitis 02/02/2017  . Obesity (BMI 30-39.9) 02/02/2017  . Adjustment insomnia 02/02/2017  . Postmenopausal estrogen deficiency 02/02/2017  . Essential hypertension 02/02/2017  . Seasonal allergic rhinitis due to pollen 02/02/2017  . Arthralgia of multiple joints 02/02/2017  . Vitamin D deficiency 02/02/2017  . Lactose intolerance 02/02/2017   Social History   Tobacco Use  . Smoking status: Never Smoker  . Smokeless tobacco: Never Used  Substance Use Topics  . Alcohol use: No  . Drug use: No   Current Medications and Allergies:   .  amLODipine (NORVASC) 5 MG tablet, Take 1 tablet (5 mg total) by mouth daily., Disp: 90 tablet, Rfl: 1 .  aspirin EC 81 MG tablet, Take 81 mg by mouth daily., Disp: , Rfl:  .  B Complex-Biotin-FA (SUPER B-100 PO), Take by mouth., Disp: , Rfl:  .  calcium carbonate (TUMS - DOSED IN MG ELEMENTAL CALCIUM) 500 MG chewable tablet, Chew 1 tablet by mouth daily., Disp: , Rfl:  .  calcium-vitamin D (OSCAL WITH D) 500-200 MG-UNIT tablet, Take 1 tablet by mouth., Disp: , Rfl:  .  cetirizine (ZYRTEC) 10 MG tablet, Take 10 mg by mouth daily., Disp: , Rfl:  .   COLLAGEN PO, Take by mouth., Disp: , Rfl:  .  Ferrous Gluconate-C-Folic Acid (IRON-C PO), Take by mouth., Disp: , Rfl:  .  Lactase (LACTAID PO), Take by mouth., Disp: , Rfl:  .  levothyroxine (SYNTHROID, LEVOTHROID) 100 MCG tablet, , Disp: , Rfl: 3 .  losartan-hydrochlorothiazide (HYZAAR) 100-25 MG tablet, Take 1 tablet by mouth daily., Disp: 30 tablet, Rfl: 0 .  mometasone (NASONEX) 50 MCG/ACT nasal spray, 2 sprays by Nasal route daily.  , Disp: , Rfl:  .  naproxen (NAPROSYN) 500 MG tablet, Take 1 tablet (500 mg total) by mouth 2 (two) times daily with a meal., Disp: 30 tablet, Rfl: 0 .  polyethylene glycol (MIRALAX / GLYCOLAX) packet, Take 17 g by mouth daily., Disp: , Rfl:  .  Probiotic Product (PROBIOTIC-10 PO), Take by mouth., Disp: , Rfl:  .  traZODone (DESYREL) 50 MG tablet, Take 0.5-1 tablets (25-50 mg total) by mouth at bedtime as needed for sleep., Disp: 30 tablet, Rfl: 3 .  triamcinolone (KENALOG) 0.025 % ointment, Apply 1 application topically 2 (two) times daily., Disp: 30 g, Rfl: 0 .  venlafaxine XR (EFFEXOR XR) 37.5 MG 24 hr capsule, Take 1 capsule (37.5 mg total) by mouth daily with breakfast., Disp: 30 capsule, Rfl: 0 .  vitamin C (ASCORBIC ACID) 500 MG tablet, Take 500 mg by mouth daily., Disp: , Rfl:   No Known Allergies  Review of Systems   Pertinent items are noted in the HPI. Otherwise, ROS is negative.  Vitals:   Vitals:   10/30/17 1044  Pulse: 79  Temp: 97.8 F (36.6 C)  TempSrc: Oral  SpO2: 100%  Weight: 225 lb 9.6 oz (102.3 kg)  Height: 5\' 4"  (1.626 m)     Body mass index is 38.72 kg/m.  Physical Exam:   General: Cooperative, alert and oriented, well developed, well nourished, in no acute distress. HEENT: EOMI. Conjunctivae and lids unremarkable, funduscopic exam and visual fields not performed. No pallor or cyanosis, dentition good. Neck: No thyromegaly. No JVD. No carotid bruits.  Cardiovascular: Regular rhythm. S1 normal. S2 normal. No S3 or S4.  Apical impulse not displaced. No murmurs. No gallops. No rubs. Lungs: Clear bilaterally without rales, rhonchi, or wheezing.  Abdomen: Soft, nontender, no masses or hepatosplenomegaly. Extremities: No clubbing, cyanosis, erythema. No edema.  Pulses: 2+ radial, 2+ pedal pulses. Skin: Reveals no rashes.  Neurologic: Cranial nerves are intact with no focal deficits.  Psychiatric: Alert and oriented to person place and time.  Results for orders placed or performed in visit on 10/02/17  CBC with Differential/Platelet  Result Value Ref Range   WBC 4.5 4.0 - 10.5 K/uL   RBC 4.70 3.87 - 5.11 Mil/uL   Hemoglobin 13.0 12.0 - 15.0 g/dL   HCT 39.5 36.0 - 46.0 %   MCV 84.1 78.0 - 100.0 fl   MCHC 32.8 30.0 - 36.0 g/dL   RDW 14.1 11.5 - 15.5 %   Platelets 248.0 150.0 - 400.0 K/uL   Neutrophils Relative % 57.6 43.0 - 77.0 %   Lymphocytes Relative 32.1 12.0 - 46.0 %   Monocytes Relative 7.4 3.0 - 12.0 %   Eosinophils Relative 2.4 0.0 - 5.0 %   Basophils Relative 0.5 0.0 - 3.0 %   Neutro Abs 2.6 1.4 - 7.7 K/uL   Lymphs Abs 1.4 0.7 - 4.0 K/uL   Monocytes Absolute 0.3 0.1 - 1.0 K/uL   Eosinophils Absolute 0.1 0.0 - 0.7 K/uL   Basophils Absolute 0.0 0.0 - 0.1 K/uL  Comprehensive metabolic panel  Result Value Ref Range   Sodium 141 135 - 145 mEq/L   Potassium 4.3 3.5 - 5.1 mEq/L   Chloride 103 96 - 112 mEq/L   CO2 32 19 - 32 mEq/L   Glucose, Bld 108 (H) 70 - 99 mg/dL   BUN 13 6 - 23 mg/dL   Creatinine, Ser 0.74 0.40 - 1.20 mg/dL   Total Bilirubin 0.5 0.2 - 1.2 mg/dL   Alkaline Phosphatase 73 39 - 117 U/L   AST 22 0 - 37 U/L   ALT 29 0 - 35 U/L   Total Protein 7.5 6.0 - 8.3 g/dL   Albumin 4.1 3.5 - 5.2 g/dL   Calcium 9.5 8.4 - 10.5 mg/dL   GFR 103.54 >60.00 mL/min  Sedimentation rate  Result Value Ref Range   Sed Rate 18 0 - 30 mm/hr  Hemoglobin A1c  Result Value Ref Range   Hgb A1c MFr Bld 5.5 4.6 - 6.5 %  Follicle stimulating hormone  Result Value Ref Range   FSH 110.7 mIU/ML    Luteinizing hormone  Result Value Ref Range   LH 47.19 mIU/mL  ANA  Result Value Ref Range   Anti Nuclear Antibody(ANA) NEGATIVE NEGATIVE  Rheumatoid factor  Result Value Ref Range   Rhuematoid fact SerPl-aCnc <14 <14 IU/mL  C-reactive protein  Result Value Ref Range   CRP  1.2 0.5 - 20.0 mg/dL    Assessment and Plan:   Obesity (BMI 30-39.9) Signs of hypothyroidism: none. Signs of hypercortisolism: none. Contraindications to weight loss: none. Patient readiness to commit to diet and activity changes: excellent. Barriers to weight loss: none.  Plan: 1. Diagnostic studies to rule out secondary causes of obesity: see below. 2. General patient education:   Average sustained weight loss in [place]-term studies w/lifestyle interventions alone is 10-15 lb.  Importance of [place]-term maintenance tx in weight loss.  Use non-food self-rewards to reinforce behavior changes.  Elicit support from others; identify saboteurs.  Practical target weight is usually around 2 BMI units below current weight. 3. Diet interventions:   Risks of dieting were reviewed, including fatigue, temporary hair loss, gallstone formation, gout, and with very low calorie diets, electrolyte abnormalities, nutrient inadequacies, and loss of lean body mass. 4. Exercise intervention:   Informal measures, e.g. taking stairs instead of elevator.  Formal exercise regimen options. 5. Other behavioral treatment: stress management. 6. Other treatment: Medication: PHENTERMINE. 7. Patient to keep a weight log that we will review at follow up. 8. Follow up: 3 months and as needed.  Essential hypertension Plan: Dietary sodium restriction. Regular aerobic exercise. Follow up: 3 months and as needed..   Arthralgia of multiple joints Negative Rheum Panel. Patient's worst complaints of pain are at bilateral knees. Discussed weight loss for help of this issue as well as the option of knee injections in the future.   OA  (osteoarthritis) of knee, bilateral Will focus on weight loss. To [name] for knee injections if needed.   Adjustment insomnia Improved with Trazodone. Using 2-3 times per week. Will continue.   Vasomotor symptoms due to menopause Improved somewhat on Effexor. Previously on estrogen. Okay to restart per GYN recommendations.   Meds ordered this encounter  Medications  . phentermine (ADIPEX-P) 37.5 MG tablet    Sig: Take 1 tablet (37.5 mg total) by mouth daily before breakfast.    Dispense:  30 tablet    Refill:  0  . venlafaxine XR (EFFEXOR XR) 37.5 MG 24 hr capsule    Sig: Take 1 capsule (37.5 mg total) by mouth daily with breakfast.    Dispense:  30 capsule    Refill:  0  . traZODone (DESYREL) 50 MG tablet    Sig: Take 0.5-1 tablets (25-50 mg total) by mouth at bedtime as needed for sleep.    Dispense:  30 tablet    Refill:  3  . losartan-hydrochlorothiazide (HYZAAR) 100-25 MG tablet    Sig: Take 1 tablet by mouth daily.    Dispense:  30 tablet    Refill:  0    . Reviewed expectations re: course of current medical issues. . Discussed self-management of symptoms. . Outlined signs and symptoms indicating need for more acute intervention. . Patient verbalized understanding and all questions were answered. Marland Kitchen Health Maintenance issues including appropriate healthy diet, exercise, and smoking avoidance were discussed with patient. . See orders for this visit as documented in the electronic medical record. . Patient received an After Visit Summary.  Briscoe Deutscher, DO [place], [place] 10/31/2017  Records requested if needed. Time spent with the patient: 45 minutes, of which >50% was spent in obtaining information about her symptoms, reviewing her previous labs, evaluations, and treatments, counseling her about her condition (please see the discussed topics above), and developing a plan to further investigate it; she had a number of questions which I addressed.

## 2017-10-30 ENCOUNTER — Encounter: Payer: Self-pay | Admitting: Family Medicine

## 2017-10-30 ENCOUNTER — Ambulatory Visit: Payer: 59 | Admitting: Family Medicine

## 2017-10-30 VITALS — HR 79 | Temp 97.8°F | Ht 64.0 in | Wt 225.6 lb

## 2017-10-30 DIAGNOSIS — I10 Essential (primary) hypertension: Secondary | ICD-10-CM

## 2017-10-30 DIAGNOSIS — M2550 Pain in unspecified joint: Secondary | ICD-10-CM | POA: Diagnosis not present

## 2017-10-30 DIAGNOSIS — E669 Obesity, unspecified: Secondary | ICD-10-CM

## 2017-10-30 DIAGNOSIS — F5102 Adjustment insomnia: Secondary | ICD-10-CM | POA: Diagnosis not present

## 2017-10-30 DIAGNOSIS — N951 Menopausal and female climacteric states: Secondary | ICD-10-CM

## 2017-10-30 DIAGNOSIS — M170 Bilateral primary osteoarthritis of knee: Secondary | ICD-10-CM | POA: Diagnosis not present

## 2017-10-30 MED ORDER — PHENTERMINE HCL 37.5 MG PO TABS: 37.5000 mg | ORAL_TABLET | Freq: Every day | ORAL | 0 refills | Status: DC

## 2017-10-30 MED ORDER — LOSARTAN POTASSIUM-HCTZ 100-25 MG PO TABS: 1.0000 | ORAL_TABLET | Freq: Every day | ORAL | 0 refills | Status: DC

## 2017-10-30 MED ORDER — TRAZODONE HCL 50 MG PO TABS: 25.0000 mg | ORAL_TABLET | Freq: Every evening | ORAL | 3 refills | Status: DC | PRN

## 2017-10-30 MED ORDER — VENLAFAXINE HCL ER 37.5 MG PO CP24: 37.5000 mg | ORAL_CAPSULE | Freq: Every day | ORAL | 0 refills | Status: DC

## 2017-10-31 ENCOUNTER — Telehealth: Payer: Self-pay | Admitting: Family Medicine

## 2017-10-31 ENCOUNTER — Encounter: Payer: Self-pay | Admitting: Family Medicine

## 2017-10-31 DIAGNOSIS — M1710 Unilateral primary osteoarthritis, unspecified knee: Secondary | ICD-10-CM | POA: Insufficient documentation

## 2017-10-31 DIAGNOSIS — M179 Osteoarthritis of knee, unspecified: Secondary | ICD-10-CM | POA: Insufficient documentation

## 2017-10-31 DIAGNOSIS — N951 Menopausal and female climacteric states: Secondary | ICD-10-CM | POA: Insufficient documentation

## 2017-10-31 HISTORY — DX: Osteoarthritis of knee, unspecified: M17.9

## 2017-10-31 NOTE — Assessment & Plan Note (Signed)
Negative Rheum Panel. Patient's worst complaints of pain are at bilateral knees. Discussed weight loss for help of this issue as well as the option of knee injections in the future.

## 2017-10-31 NOTE — Assessment & Plan Note (Signed)
Will focus on weight loss. To [name] for knee injections if needed.

## 2017-10-31 NOTE — Assessment & Plan Note (Addendum)
Improved with Trazodone. Using 2-3 times per week. Will continue.

## 2017-10-31 NOTE — Assessment & Plan Note (Signed)
Improved somewhat on Effexor. Previously on estrogen. Okay to restart per GYN recommendations.

## 2017-10-31 NOTE — Assessment & Plan Note (Signed)
Signs of hypothyroidism: none. Signs of hypercortisolism: none. Contraindications to weight loss: none. Patient readiness to commit to diet and activity changes: excellent. Barriers to weight loss: none.  Plan: 1. Diagnostic studies to rule out secondary causes of obesity: see below. 2. General patient education:   Average sustained weight loss in [place]-term studies w/lifestyle interventions alone is 10-15 lb.  Importance of [place]-term maintenance tx in weight loss.  Use non-food self-rewards to reinforce behavior changes.  Elicit support from others; identify saboteurs.  Practical target weight is usually around 2 BMI units below current weight. 3. Diet interventions:   Risks of dieting were reviewed, including fatigue, temporary hair loss, gallstone formation, gout, and with very low calorie diets, electrolyte abnormalities, nutrient inadequacies, and loss of lean body mass. 4. Exercise intervention:   Informal measures, e.g. taking stairs instead of elevator.  Formal exercise regimen options. 5. Other behavioral treatment: stress management. 6. Other treatment: Medication: PHENTERMINE. 7. Patient to keep a weight log that we will review at follow up. 8. Follow up: 3 months and as needed.

## 2017-10-31 NOTE — Telephone Encounter (Signed)
Copied from [facility] [id number]. Topic: Quick Communication - Rx Refill/Question >> Oct 31, 2017  3:39 PM Gardiner Ramus wrote: Medication: estradiol (ESTRACE) 2 MG tablet [[id number]]  pt called and stated that this is the medication that is working well for her hot flashed. She would like to know if this can be called in for her. Please advise.  [place], [place] - [place] [phone] (Phone) [phone] (Fax)

## 2017-10-31 NOTE — Assessment & Plan Note (Signed)
Plan: Dietary sodium restriction. Regular aerobic exercise. Follow up: 3 months and as needed.Marland Kitchen

## 2017-10-31 NOTE — Telephone Encounter (Signed)
See note

## 2017-10-31 NOTE — Telephone Encounter (Signed)
Do not see on med list at all ok to call in?

## 2017-10-31 NOTE — Telephone Encounter (Signed)
Yes, okay to send. You will see it if you do a med rec on snapshot. Make sure to tell her to check her BP when she restarts it.

## 2017-11-02 NOTE — Telephone Encounter (Signed)
Left message to return call to our office.  

## 2017-11-08 ENCOUNTER — Other Ambulatory Visit: Payer: Self-pay

## 2017-11-08 NOTE — Telephone Encounter (Signed)
Patient also wanted to know if she can start weaning off the effexor. She thinks that she is on to much medications. I have updated her list on the phone. She states that with the estrace stopped her hot flashes and she thinks that was the only reason she started on the Effexor.

## 2017-11-08 NOTE — Telephone Encounter (Signed)
Left message to return call to our office.  

## 2017-11-08 NOTE — Telephone Encounter (Signed)
Patient called back was filled by GYN this time.

## 2017-11-08 NOTE — Telephone Encounter (Signed)
Okay to wean. Take every other day x 1 week, then off.

## 2017-11-09 NOTE — Telephone Encounter (Signed)
Called patient gave information had repeat back to me will call if any questions/ problems.

## 2017-11-28 DIAGNOSIS — K5904 Chronic idiopathic constipation: Secondary | ICD-10-CM | POA: Diagnosis not present

## 2017-11-28 DIAGNOSIS — K625 Hemorrhage of anus and rectum: Secondary | ICD-10-CM | POA: Diagnosis not present

## 2017-12-01 DIAGNOSIS — H5213 Myopia, bilateral: Secondary | ICD-10-CM | POA: Diagnosis not present

## 2017-12-09 ENCOUNTER — Telehealth: Payer: Self-pay | Admitting: Family Medicine

## 2017-12-14 DIAGNOSIS — N760 Acute vaginitis: Secondary | ICD-10-CM | POA: Diagnosis not present

## 2017-12-17 MED FILL — LEVOTHYROXINE 125 MCG TAB: 125 | 30 days supply | Qty: 30 | Fill #0

## 2017-12-19 ENCOUNTER — Other Ambulatory Visit: Payer: Self-pay

## 2017-12-19 MED ORDER — LOSARTAN POTASSIUM 100 MG PO TABS: 100.0000 mg | ORAL_TABLET | Freq: Every day | ORAL | 3 refills | Status: DC

## 2017-12-19 MED ORDER — HYDROCHLOROTHIAZIDE 25 MG PO TABS: 25.0000 mg | ORAL_TABLET | Freq: Every day | ORAL | 3 refills | Status: DC

## 2017-12-19 NOTE — Telephone Encounter (Signed)
Separate scripts sent in

## 2017-12-19 NOTE — Telephone Encounter (Signed)
Audrea Muscat w/[facility] [phone] stated that the patient's Losartan-Hydrochlorothiazide (HYZAAR) 100-25 MG tablets medication is on back order.  They would like a prescription for the two seperate drugs.

## 2017-12-19 NOTE — Telephone Encounter (Signed)
See note

## 2017-12-21 ENCOUNTER — Ambulatory Visit: Payer: 59 | Admitting: Physician Assistant

## 2017-12-21 ENCOUNTER — Ambulatory Visit: Payer: Self-pay | Admitting: *Deleted

## 2017-12-21 ENCOUNTER — Encounter: Payer: Self-pay | Admitting: Physician Assistant

## 2017-12-21 VITALS — BP 110/70 | HR 83 | Temp 97.7°F | Ht 64.0 in | Wt 214.0 lb

## 2017-12-21 DIAGNOSIS — M7989 Other specified soft tissue disorders: Secondary | ICD-10-CM

## 2017-12-21 NOTE — Telephone Encounter (Signed)
Patient called in and stated she woke up to her left hand being swollen she cant get her ring on. No numbness to hand and she is able to feel her fingers. Her bp is 108/73 pulse 95  Reason for Disposition . MODERATE hand swelling (e.g., visible swelling of hand and fingers; pitting edema)  Answer Assessment - Initial Assessment Questions 1. ONSET: "When did the swelling start?" (e.g., minutes, hours, days)     Patient woke this morning 2. LOCATION: "What part of the hand is swollen?"  "Are both hands swollen or just one hand?"     Whole hand- left hand 3. SEVERITY: "How bad is the swelling?" (e.g., localized; mild, moderate, severe)   - BALL OR LUMP: small ball or lump   - LOCALIZED: puffy or swollen area or patch of skin   - JOINT SWELLING: swelling of a joint   - MILD: puffiness or mild swelling of fingers or hand   - MODERATE: fingers and hand are swollen   - SEVERE: swelling of entire hand and up into forearm     moderate 4. REDNESS: "Does the swelling look red or infected?"     no 5. PAIN: "Is the swelling painful to touch?" If so, ask: "How painful is it?"   (Scale 1-10; mild, moderate or severe)     No- tight 6. FEVER: "Do you have a fever?" If so, ask: "What is it, how was it measured, and when did it start?"      no 7. CAUSE: "What do you think is causing the hand swelling?" (e.g., heat, insect bite, pregnancy, recent injury)     Patient did get massage last night 8. MEDICAL HISTORY: "Do you have a history of heart failure, kidney disease, liver failure, or cancer?"     no 9. RECURRENT SYMPTOM: "Have you had hand swelling before?" If so, ask: "When was the last time?" "What happened that time?"     no 10. OTHER SYMPTOMS: "Do you have any other symptoms?" (e.g., blurred vision, difficulty breathing, headache)       Headaches in the last couple days 11. PREGNANCY: "Is there any chance you are pregnant?" "When was your last menstrual period?"       n/a  Protocols used: HAND  [facility]

## 2017-12-21 NOTE — Telephone Encounter (Signed)
See note

## 2017-12-21 NOTE — Patient Instructions (Signed)
It was great to see you!  Follow-up if symptoms persist.  Please review your medications when you get home.

## 2017-12-21 NOTE — Progress Notes (Signed)
Diane Alexander is a 57 y.o. female here for a new problem.  I acted as a Education administrator for [employer], PA-C Anselmo Pickler, LPN  History of Present Illness:   Chief Complaint  Patient presents with  . Edema    HPI  Edema Pt woke up this morning with swelling in left hand all the way up into her wrist, knuckles were swollen and painful. She had a massage last night but gets these regularly. Denies prior trauma or recent bite.  Denies unusual rashes, fever.  She also notes that her Hyzaar was recently on backorder. After reviewing medications, patient is almost certain she is only taking Losartan, as she has not picked up a HCTZ prescription.   Past Medical History:  Diagnosis Date  . Allergy   . Constipation   . Lumbar arthropathy      Social History   Socioeconomic History  . Marital status: Married    Spouse name: Not on file  . Number of children: Not on file  . Years of education: Not on file  . Highest education level: Not on file  Occupational History    Employer: [employer]  Social Needs  . Financial resource strain: Not on file  . Food insecurity:    Worry: Not on file    Inability: Not on file  . Transportation needs:    Medical: Not on file    Non-medical: Not on file  Tobacco Use  . Smoking status: Never Smoker  . Smokeless tobacco: Never Used  Substance and Sexual Activity  . Alcohol use: No  . Drug use: No  . Sexual activity: Not on file  Lifestyle  . Physical activity:    Days per week: Not on file    Minutes per session: Not on file  . Stress: Not on file  Relationships  . Social connections:    Talks on phone: Not on file    Gets together: Not on file    Attends religious service: Not on file    Active member of club or organization: Not on file    Attends meetings of clubs or organizations: Not on file    Relationship status: Not on file  . Intimate partner violence:    Fear of current or ex partner: Not on file     Emotionally abused: Not on file    Physically abused: Not on file    Forced sexual activity: Not on file  Other Topics Concern  . Not on file  Social History Narrative  . Not on file    Past Surgical History:  Procedure Laterality Date  . BREAST CYST EXCISION    . CHOLECYSTECTOMY    . VAGINAL HYSTERECTOMY      Family History  Problem Relation Age of Onset  . Lymphoma Sister 39  . Heart disease Mother   . Heart attack Father   . Cancer Maternal Grandmother     No Known Allergies  Current Medications:   Current Outpatient Medications:  .  amLODipine (NORVASC) 5 MG tablet, Take 1 tablet (5 mg total) by mouth daily., Disp: 90 tablet, Rfl: 1 .  B Complex-Biotin-FA (SUPER B-100 PO), Take by mouth., Disp: , Rfl:  .  calcium carbonate (TUMS - DOSED IN MG ELEMENTAL CALCIUM) 500 MG chewable tablet, Chew 1 tablet by mouth daily., Disp: , Rfl:  .  calcium-vitamin D (OSCAL WITH D) 500-200 MG-UNIT tablet, Take 1 tablet by mouth., Disp: , Rfl:  .  cetirizine (ZYRTEC) 10 MG tablet, Take 10 mg by mouth daily., Disp: , Rfl:  .  CHOLINE PO, Take 250 mg by mouth daily as needed., Disp: , Rfl:  .  estradiol (ESTRACE) 2 MG tablet, estradiol 2 mg tablet, Disp: , Rfl:  .  Ferrous Gluconate-C-Folic Acid (IRON-C PO), Take by mouth., Disp: , Rfl:  .  hydrochlorothiazide (HYDRODIURIL) 25 MG tablet, Take 1 tablet (25 mg total) by mouth daily. (Patient not taking: Reported on 12/21/2017), Disp: 90 tablet, Rfl: 3 .  Lactase (LACTAID PO), Take by mouth., Disp: , Rfl:  .  levothyroxine (SYNTHROID, LEVOTHROID) 125 MCG tablet, Take 1 tablet by mouth daily., Disp: , Rfl: 6 .  losartan (COZAAR) 100 MG tablet, Take 1 tablet (100 mg total) by mouth daily., Disp: 90 tablet, Rfl: 3 .  mometasone (NASONEX) 50 MCG/ACT nasal spray, 2 sprays by Nasal route daily.  , Disp: , Rfl:  .  phentermine (ADIPEX-P) 37.5 MG tablet, Take 1 tablet (37.5 mg total) by mouth daily before breakfast., Disp: 30 tablet, Rfl: 0 .   polyethylene glycol (MIRALAX / GLYCOLAX) packet, Take 17 g by mouth daily., Disp: , Rfl:  .  traZODone (DESYREL) 50 MG tablet, Take 0.5-1 tablets (25-50 mg total) by mouth at bedtime as needed for sleep., Disp: 30 tablet, Rfl: 3 .  venlafaxine XR (EFFEXOR XR) 37.5 MG 24 hr capsule, Take 1 capsule (37.5 mg total) by mouth daily with breakfast., Disp: 30 capsule, Rfl: 0 .  vitamin C (ASCORBIC ACID) 500 MG tablet, Take 500 mg by mouth daily., Disp: , Rfl:    Review of Systems:   ROS  Negative unless otherwise specified per HPI.  Vitals:   Vitals:   12/21/17 1251  BP: 110/70  Pulse: 83  Temp: 97.7 F (36.5 C)  TempSrc: Oral  SpO2: 98%  Weight: 214 lb (97.1 kg)  Height: 5\' 4"  (1.626 m)     Body mass index is 36.73 kg/m.  Physical Exam:   Physical Exam Vitals signs and nursing note reviewed.  Constitutional:      General: She is not in acute distress.    Appearance: She is well-developed. She is not ill-appearing or toxic-appearing.  HENT:     Head: Normocephalic and atraumatic.  Cardiovascular:     Rate and Rhythm: Normal rate and regular rhythm.     Heart sounds: Normal heart sounds.     Comments: Radial pulse present in bilateral hands Normal cap refill in bilateral fingers Pulmonary:     Effort: Pulmonary effort is normal. No accessory muscle usage or respiratory distress.     Breath sounds: Normal breath sounds.  Musculoskeletal:     Comments: Bilateral hands without swelling, decreased ROM, pain with movement, erythema  Skin:    General: Skin is warm and dry.  Neurological:     Mental Status: She is alert.     Comments: Normal sensation to bilateral hands  Psychiatric:        Speech: Speech normal.        Behavior: Behavior is cooperative.      Assessment and Plan:   Bridie was seen today for edema.  Diagnoses and all orders for this visit:  Swelling of left hand   No red flags on exam. Issue resolved at time of my visit. We did review her medications  and I strongly encouraged her to make sure that she is on her HCTZ 25 mg. Patient verbalized understanding to plan.  . Reviewed expectations re: course  of current medical issues. . Discussed self-management of symptoms. . Outlined signs and symptoms indicating need for more acute intervention. . Patient verbalized understanding and all questions were answered. . See orders for this visit as documented in the electronic medical record. . Patient received an After-Visit Summary.  CMA or LPN served as scribe during this visit. History, Physical, and Plan performed by medical provider. The above documentation has been reviewed and is accurate and complete.   Inda Coke, PA-C

## 2017-12-21 NOTE — Telephone Encounter (Signed)
Noted! Routing to pcp for [employer]

## 2018-01-02 ENCOUNTER — Other Ambulatory Visit: Payer: Self-pay | Admitting: Family Medicine

## 2018-01-03 NOTE — Telephone Encounter (Signed)
Ok to fill last seen in October and no f/u app made?

## 2018-01-08 ENCOUNTER — Ambulatory Visit (INDEPENDENT_AMBULATORY_CARE_PROVIDER_SITE_OTHER): Payer: 59 | Admitting: Psychology

## 2018-01-08 DIAGNOSIS — F4323 Adjustment disorder with mixed anxiety and depressed mood: Secondary | ICD-10-CM

## 2018-01-10 MED FILL — AMLODIPINE BESYLATE 5 MG TA: 5 | 30 days supply | Qty: 30 | Fill #0

## 2018-01-14 ENCOUNTER — Other Ambulatory Visit: Payer: Self-pay | Admitting: Family Medicine

## 2018-01-14 DIAGNOSIS — N951 Menopausal and female climacteric states: Secondary | ICD-10-CM

## 2018-01-14 NOTE — Telephone Encounter (Signed)
Requested medication (s) are due for refill today: yes  Requested medication (s) are on the active medication list: yes  Last refill:  10/30/17 for 30 tabs  Future visit scheduled: no  Notes to clinic:  Antidepressants - SNRI - desvenlafaxine & venlafaxine failed  Requested Prescriptions  Pending Prescriptions Disp Refills   venlafaxine XR (EFFEXOR XR) 37.5 MG 24 hr capsule 30 capsule 0    Sig: Take 1 capsule (37.5 mg total) by mouth daily with breakfast.     Psychiatry: Antidepressants - SNRI - desvenlafaxine & venlafaxine Failed - 01/14/2018  1:06 PM      Failed - LDL in normal range and within 360 days    No results found for: LDLCALC, LDLC, HIRISKLDL       Failed - Total Cholesterol in normal range and within 360 days    No results found for: CHOL, POCCHOL       Failed - Triglycerides in normal range and within 360 days    No results found for: TRIG       Passed - Last BP in normal range    BP Readings from Last 1 Encounters:  12/21/17 110/70         Passed - Valid encounter within last 6 months    Recent Outpatient Visits          3 weeks ago Swelling of left hand   [facility] [place], [place], [place]   2 months ago Vasomotor symptoms due to menopause   [place], [place], DO   3 months ago [place] and fatigue   [facility] [place], [place], DO   7 months ago Chronic heel pain, left   [facility] [place], [place]grand [place] D, DO   7 months ago Pain of foot, unspecified laterality   [facility] [address] [place], [place], [place]

## 2018-01-14 NOTE — Telephone Encounter (Signed)
Copied from [place] [id number]. Topic: Quick Communication - Rx Refill/Question >> Jan 14, 2018 12:16 PM Judyann Munson wrote: Medication: venlafaxine XR (EFFEXOR XR) 37.5 MG 24 hr capsule     Has the patient contacted their pharmacy? Yes   Preferred Pharmacy (with phone number or street name): [facility], [place]. [phone] (Phone) [phone] (Fax)    Agent: Please be advised that RX refills may take up to 3 business days. We ask that you follow-up with your pharmacy.

## 2018-01-14 NOTE — Telephone Encounter (Signed)
See note

## 2018-01-15 MED ORDER — VENLAFAXINE HCL ER 37.5 MG PO CP24: 37.5000 mg | ORAL_CAPSULE | Freq: Every day | ORAL | 0 refills | Status: DC

## 2018-01-15 MED FILL — VENLAFAXINE HCL ER 37.5 MG: 37.5 | 30 days supply | Qty: 30 | Fill #0

## 2018-01-17 MED FILL — LEVOTHYROXINE 125 MCG TAB: 125 | 30 days supply | Qty: 30 | Fill #1

## 2018-01-22 MED FILL — LOSARTAN-HCTZ 100-25 MG TAB: 100-25 | 30 days supply | Qty: 30 | Fill #0

## 2018-02-05 MED FILL — ESTRADIOL 2 MG TABLET: 2 | 30 days supply | Qty: 30 | Fill #0

## 2018-02-06 ENCOUNTER — Ambulatory Visit: Payer: 59 | Admitting: Family Medicine

## 2018-02-06 ENCOUNTER — Encounter: Payer: Self-pay | Admitting: Family Medicine

## 2018-02-06 VITALS — BP 120/80 | HR 88 | Temp 97.9°F | Ht 64.0 in | Wt 208.4 lb

## 2018-02-06 DIAGNOSIS — Z1211 Encounter for screening for malignant neoplasm of colon: Secondary | ICD-10-CM | POA: Diagnosis not present

## 2018-02-06 DIAGNOSIS — N951 Menopausal and female climacteric states: Secondary | ICD-10-CM

## 2018-02-06 DIAGNOSIS — F5102 Adjustment insomnia: Secondary | ICD-10-CM | POA: Diagnosis not present

## 2018-02-06 DIAGNOSIS — Z114 Encounter for screening for human immunodeficiency virus [HIV]: Secondary | ICD-10-CM | POA: Diagnosis not present

## 2018-02-06 DIAGNOSIS — I10 Essential (primary) hypertension: Secondary | ICD-10-CM | POA: Diagnosis not present

## 2018-02-06 DIAGNOSIS — M722 Plantar fascial fibromatosis: Secondary | ICD-10-CM

## 2018-02-06 DIAGNOSIS — Z1159 Encounter for screening for other viral diseases: Secondary | ICD-10-CM | POA: Diagnosis not present

## 2018-02-06 DIAGNOSIS — Z9189 Other specified personal risk factors, not elsewhere classified: Secondary | ICD-10-CM

## 2018-02-06 NOTE — Progress Notes (Signed)
Diane Alexander is a 58 y.o. female is here for follow up.  History of Present Illness:   [name], CMA acting as scribe for Dr. Briscoe Deutscher.   HPI: Patient in for medication follow up.   Obesity: Patient is taking Phentermine 37.5 daily.  Usual eating pattern includes: she was doing well with diet but had hard time over the holliday.. Usual physical activity includes she has not had normal exercise due to pain in feet. Current life stressors: employment concern and is not working at this time.  Wt Readings from Last 3 Encounters:  02/06/18 208 lb 6.4 oz (94.5 kg)  12/21/17 214 lb (97.1 kg)  10/30/17 225 lb 9.6 oz (102.3 kg)   Sleep has improved with Trazodone. Vasomotor symptoms and pain improved with Effexor.   Health Maintenance Due  Topic Date Due  . Hepatitis C Screening  08-27-1960  . HIV Screening  05/29/1974  . MAMMOGRAM  10/04/2012   Depression screen [facility] 2/9 02/06/2018 01/29/2017  Decreased Interest 0 0  Down, Depressed, Hopeless 0 0  PHQ - 2 Score 0 0  Altered sleeping 3 -  Tired, decreased energy 1 -  Change in appetite 1 -  Feeling bad or failure about yourself  0 -  Trouble concentrating 0 -  Moving slowly or fidgety/restless 0 -  Suicidal thoughts 0 -  PHQ-9 Score 5 -  Difficult doing work/chores Not difficult at all -   PMHx, SurgHx, SocialHx, FamHx, Medications, and Allergies were reviewed in the Visit Navigator and updated as appropriate.   Patient Active Problem List   Diagnosis Date Noted  . Plantar fascia syndrome 02/09/2018  . OA (osteoarthritis) of knee, bilateral 10/31/2017  . Vasomotor symptoms due to menopause 10/31/2017  . Thyromegaly 02/02/2017  . Dry skin dermatitis 02/02/2017  . Obesity (BMI 30-39.9) 02/02/2017  . Adjustment insomnia 02/02/2017  . Postmenopausal estrogen deficiency 02/02/2017  . Essential hypertension 02/02/2017  . Seasonal allergic rhinitis due to pollen 02/02/2017  . Arthralgia of multiple joints 02/02/2017    . Vitamin D deficiency 02/02/2017  . Lactose intolerance 02/02/2017   Social History   Tobacco Use  . Smoking status: Never Smoker  . Smokeless tobacco: Never Used  Substance Use Topics  . Alcohol use: No  . Drug use: No   Current Medications and Allergies   .  amLODipine (NORVASC) 5 MG tablet, Take 1 tablet (5 mg total) by mouth daily., Disp: 90 tablet, Rfl: 1 .  B Complex-Biotin-FA (SUPER B-100 PO), Take by mouth., Disp: , Rfl:  .  calcium carbonate (TUMS - DOSED IN MG ELEMENTAL CALCIUM) 500 MG chewable tablet, Chew 1 tablet by mouth daily., Disp: , Rfl:  .  calcium-vitamin D (OSCAL WITH D) 500-200 MG-UNIT tablet, Take 1 tablet by mouth., Disp: , Rfl:  .  cetirizine (ZYRTEC) 10 MG tablet, Take 10 mg by mouth daily., Disp: , Rfl:  .  CHOLINE PO, Take 250 mg by mouth daily as needed., Disp: , Rfl:  .  estradiol (ESTRACE) 2 MG tablet, estradiol 2 mg tablet, Disp: , Rfl:  .  Ferrous Gluconate-C-Folic Acid (IRON-C PO), Take by mouth., Disp: , Rfl:  .  hydrochlorothiazide (HYDRODIURIL) 25 MG tablet, Take 1 tablet (25 mg total) by mouth daily. (Patient not taking: Reported on 12/21/2017), Disp: 90 tablet, Rfl: 3 .  Lactase (LACTAID PO), Take by mouth., Disp: , Rfl:  .  levothyroxine (SYNTHROID, LEVOTHROID) 125 MCG tablet, Take 1 tablet by mouth daily., Disp: , Rfl:  6 .  losartan (COZAAR) 100 MG tablet, Take 1 tablet (100 mg total) by mouth daily., Disp: 90 tablet, Rfl: 3 .  mometasone (NASONEX) 50 MCG/ACT nasal spray, 2 sprays by Nasal route daily.  , Disp: , Rfl:  .  phentermine (ADIPEX-P) 37.5 MG tablet, TAKE ONE TABLET BY MOUTH ONE TIME DAILY BEFORE BREAKFAST, Disp: 30 tablet, Rfl: 0 .  polyethylene glycol (MIRALAX / GLYCOLAX) packet, Take 17 g by mouth daily., Disp: , Rfl:  .  traZODone (DESYREL) 50 MG tablet, Take 0.5-1 tablets (25-50 mg total) by mouth at bedtime as needed for sleep., Disp: 30 tablet, Rfl: 3 .  venlafaxine XR (EFFEXOR XR) 37.5 MG 24 hr capsule, Take 1 capsule (37.5  mg total) by mouth daily with breakfast., Disp: 30 capsule, Rfl: 0 .  vitamin C (ASCORBIC ACID) 500 MG tablet, Take 500 mg by mouth daily., Disp: , Rfl:   No Known Allergies   Review of Systems   Pertinent items are noted in the HPI. Otherwise, a complete ROS is negative.  Vitals   Vitals:   02/06/18 0840  BP: 120/80  Pulse: 88  Temp: 97.9 F (36.6 C)  TempSrc: Oral  SpO2: 98%  Weight: 208 lb 6.4 oz (94.5 kg)  Height: 5\' 4"  (1.626 m)     Body mass index is 35.77 kg/m.  Physical Exam   Physical Exam Vitals signs and nursing note reviewed.  Constitutional:      General: She is not in acute distress.    Appearance: She is well-developed.  HENT:     Head: Normocephalic and atraumatic.     Right Ear: External ear normal.     Left Ear: External ear normal.     Nose: Nose normal.  Eyes:     Conjunctiva/sclera: Conjunctivae normal.     Pupils: Pupils are equal, round, and reactive to light.  Neck:     Musculoskeletal: Normal range of motion and neck supple.     Thyroid: No thyromegaly.  Cardiovascular:     Rate and Rhythm: Normal rate and regular rhythm.     Heart sounds: Normal heart sounds.  Pulmonary:     Effort: Pulmonary effort is normal.     Breath sounds: Normal breath sounds.  Abdominal:     General: Bowel sounds are normal.     Palpations: Abdomen is soft.  Musculoskeletal: Normal range of motion.  Lymphadenopathy:     Cervical: No cervical adenopathy.  Skin:    General: Skin is warm and dry.     Capillary Refill: Capillary refill takes less than 2 seconds.  Neurological:     Mental Status: She is alert and oriented to person, place, and time.  Psychiatric:        Behavior: Behavior normal.    Assessment and Plan   Tom was seen today for medication refill.  Diagnoses and all orders for this visit:  Vasomotor symptoms due to menopause Comments: Improved.  Orders: -     venlafaxine XR (EFFEXOR XR) 37.5 MG 24 hr capsule; Take 1 capsule (37.5  mg total) by mouth daily with breakfast.  Encounter for screening for HIV -     HIV Antibody (routine testing w rflx); Future  Encounter for hepatitis C virus screening test for high risk patient -     Hepatitis C antibody; Future  Adjustment insomnia -     traZODone (DESYREL) 50 MG tablet; Take 0.5-1 tablets (25-50 mg total) by mouth at bedtime as needed for sleep.  Essential hypertension  Screening for malignant neoplasm of colon  Obesity (BMI 30-39.9) -     phentermine (ADIPEX-P) 37.5 MG tablet; TAKE ONE TABLET BY MOUTH ONE TIME DAILY BEFORE BREAKFAST  Plantar fascia syndrome Comments: Improving with weight loss.    . Orders and follow up as documented in [place], reviewed diet, exercise and weight control, cardiovascular risk and specific lipid/LDL goals reviewed, reviewed medications and side effects in detail.  . Reviewed expectations re: course of current medical issues. . Outlined signs and symptoms indicating need for more acute intervention. . Patient verbalized understanding and all questions were answered. . Patient received an After Visit Summary.  CMA served as Education administrator during this visit. History, Physical, and Plan performed by medical provider. The above documentation has been reviewed and is accurate and complete. Briscoe Deutscher, D.O.  Briscoe Deutscher, DO [place], [place] 02/09/2018

## 2018-02-07 ENCOUNTER — Ambulatory Visit (INDEPENDENT_AMBULATORY_CARE_PROVIDER_SITE_OTHER): Payer: Commercial Managed Care - PPO | Admitting: Psychology

## 2018-02-07 DIAGNOSIS — F4323 Adjustment disorder with mixed anxiety and depressed mood: Secondary | ICD-10-CM

## 2018-02-09 DIAGNOSIS — M722 Plantar fascial fibromatosis: Secondary | ICD-10-CM | POA: Insufficient documentation

## 2018-02-09 MED ORDER — HYDROCHLOROTHIAZIDE 25 MG PO TABS: 25.0000 mg | ORAL_TABLET | Freq: Every day | ORAL | 3 refills | Status: DC

## 2018-02-09 MED ORDER — TRAZODONE HCL 50 MG PO TABS: 25.0000 mg | ORAL_TABLET | Freq: Every evening | ORAL | 3 refills | Status: DC | PRN

## 2018-02-09 MED ORDER — VENLAFAXINE HCL ER 37.5 MG PO CP24: 37.5000 mg | ORAL_CAPSULE | Freq: Every day | ORAL | 3 refills | Status: DC

## 2018-02-09 MED ORDER — PHENTERMINE HCL 37.5 MG PO TABS: ORAL_TABLET | ORAL | 2 refills | Status: DC

## 2018-02-09 MED ORDER — LOSARTAN POTASSIUM 100 MG PO TABS: 100.0000 mg | ORAL_TABLET | Freq: Every day | ORAL | 3 refills | Status: DC

## 2018-02-19 ENCOUNTER — Ambulatory Visit (INDEPENDENT_AMBULATORY_CARE_PROVIDER_SITE_OTHER): Payer: 59 | Admitting: Psychology

## 2018-02-19 DIAGNOSIS — F4323 Adjustment disorder with mixed anxiety and depressed mood: Secondary | ICD-10-CM | POA: Diagnosis not present

## 2018-02-28 MED FILL — AMLODIPINE BESYLATE 5 MG TA: 5 | 30 days supply | Qty: 30 | Fill #1

## 2018-02-28 MED FILL — LOSARTAN-HCTZ 100-25 MG TAB: 100-25 | 30 days supply | Qty: 30 | Fill #1

## 2018-03-05 ENCOUNTER — Ambulatory Visit: Payer: Commercial Managed Care - PPO | Admitting: Psychology

## 2018-03-07 ENCOUNTER — Telehealth: Payer: Self-pay | Admitting: Family Medicine

## 2018-03-07 NOTE — Telephone Encounter (Signed)
Copied from [place] [id number]. Topic: General - Other >> Mar 07, 2018 10:04 AM Ivar Drape wrote: Reason for CRM:   Patient wanted to know what to take for her cough that will not interfere with the medications that she take

## 2018-03-08 NOTE — Telephone Encounter (Signed)
Nothing with decongestant. Okay mucinex dm or similar.

## 2018-03-08 NOTE — Telephone Encounter (Signed)
Please advise 

## 2018-03-18 MED FILL — LEVOTHYROXINE 125 MCG TAB: 125 | 30 days supply | Qty: 30 | Fill #2 | Status: TO

## 2018-03-19 ENCOUNTER — Ambulatory Visit: Payer: 59 | Admitting: Psychology

## 2018-03-21 MED FILL — PHENTERMINE 37.5 MG TABLET: 37.5 | 30 days supply | Qty: 30 | Fill #0

## 2018-03-23 MED FILL — VENLAFAXINE HCL ER 37.5 MG: 37.5 | 90 days supply | Qty: 90 | Fill #0

## 2018-03-29 MED FILL — LOSARTAN POTASSIUM 100 MG T: 100 | 90 days supply | Qty: 90 | Fill #0

## 2018-03-29 MED FILL — traZODone HCL 50 MG TABS: 50 | 90 days supply | Qty: 90 | Fill #0

## 2018-04-02 ENCOUNTER — Ambulatory Visit: Payer: Commercial Managed Care - PPO | Admitting: Psychology

## 2018-04-04 ENCOUNTER — Other Ambulatory Visit: Payer: Self-pay | Admitting: Family Medicine

## 2018-04-04 DIAGNOSIS — I10 Essential (primary) hypertension: Secondary | ICD-10-CM

## 2018-04-04 MED ORDER — AMLODIPINE BESYLATE 5 MG PO TABS: 5.0000 mg | ORAL_TABLET | Freq: Every day | ORAL | 1 refills | Status: DC

## 2018-04-04 MED FILL — AMLODIPINE BESYLATE 5 MG TA: 5 | 90 days supply | Qty: 90 | Fill #0

## 2018-04-04 NOTE — Telephone Encounter (Signed)
Rx refilled to [facility] outpatient pharmacy. Called pt and advised.

## 2018-04-04 NOTE — Telephone Encounter (Signed)
Copied from [place] [id number]. Topic: Quick Communication - Rx Refill/Question >> Apr 04, 2018 10:29 AM Antonieta Iba C wrote: Medication: amLODipine (NORVASC) 5 MG tablet  Has the patient contacted their pharmacy? Yes - pt says that she has previously requested via pharmacy  (Agent: If no, request that the patient contact the pharmacy for the refill.) (Agent: If yes, when and what did the pharmacy advise?)  Preferred Pharmacy (with phone number or street name): [facility], [place]: Please be advised that RX refills may take up to 3 business days. We ask that you follow-up with your pharmacy.

## 2018-04-04 NOTE — Progress Notes (Deleted)
Virtual Visit via Video   I connected with Diane Alexander on 04/04/18 at  1:20 PM EDT by a video enabled telemedicine application and verified that I am speaking with the correct person using two identifiers. Location patient: Home Location provider: [facility], Office Persons participating in the virtual visit: Nedra Mcinnis Siek, Briscoe Deutscher, DO   I discussed the limitations of evaluation and management by telemedicine and the availability of in person appointments. The patient expressed understanding and agreed to proceed.  Subjective:   HPI: ***  ROS: See pertinent positives and negatives per HPI.  Patient Active Problem List   Diagnosis Date Noted  . Plantar fascia syndrome 02/09/2018  . OA (osteoarthritis) of knee, bilateral 10/31/2017  . Vasomotor symptoms due to menopause 10/31/2017  . Thyromegaly 02/02/2017  . Dry skin dermatitis 02/02/2017  . Obesity (BMI 30-39.9) 02/02/2017  . Adjustment insomnia 02/02/2017  . Postmenopausal estrogen deficiency 02/02/2017  . Essential hypertension 02/02/2017  . Seasonal allergic rhinitis due to pollen 02/02/2017  . Arthralgia of multiple joints 02/02/2017  . Vitamin D deficiency 02/02/2017  . Lactose intolerance 02/02/2017    Social History   Tobacco Use  . Smoking status: Never Smoker  . Smokeless tobacco: Never Used  Substance Use Topics  . Alcohol use: No    Current Outpatient Medications:  .  amLODipine (NORVASC) 5 MG tablet, Take 1 tablet (5 mg total) by mouth daily., Disp: 90 tablet, Rfl: 1 .  B Complex-Biotin-FA (SUPER B-100 PO), Take by mouth., Disp: , Rfl:  .  calcium carbonate (TUMS - DOSED IN MG ELEMENTAL CALCIUM) 500 MG chewable tablet, Chew 1 tablet by mouth daily., Disp: , Rfl:  .  calcium-vitamin D (OSCAL WITH D) 500-200 MG-UNIT tablet, Take 1 tablet by mouth., Disp: , Rfl:  .  cetirizine (ZYRTEC) 10 MG tablet, Take 10 mg by mouth daily., Disp: , Rfl:  .  CHOLINE PO, Take 250 mg by mouth daily as needed., Disp:  , Rfl:  .  estradiol (ESTRACE) 2 MG tablet, estradiol 2 mg tablet, Disp: , Rfl:  .  Ferrous Gluconate-C-Folic Acid (IRON-C PO), Take by mouth., Disp: , Rfl:  .  Lactase (LACTAID PO), Take by mouth., Disp: , Rfl:  .  levothyroxine (SYNTHROID, LEVOTHROID) 125 MCG tablet, Take 1 tablet by mouth daily., Disp: , Rfl: 6 .  losartan-hydrochlorothiazide (HYZAAR) 100-25 MG tablet, , Disp: , Rfl:  .  mometasone (NASONEX) 50 MCG/ACT nasal spray, 2 sprays by Nasal route daily.  , Disp: , Rfl:  .  phentermine (ADIPEX-P) 37.5 MG tablet, TAKE ONE TABLET BY MOUTH ONE TIME DAILY BEFORE BREAKFAST, Disp: 30 tablet, Rfl: 2 .  polyethylene glycol (MIRALAX / GLYCOLAX) packet, Take 17 g by mouth daily., Disp: , Rfl:  .  traZODone (DESYREL) 50 MG tablet, Take 0.5-1 tablets (25-50 mg total) by mouth at bedtime as needed for sleep., Disp: 90 tablet, Rfl: 3 .  venlafaxine XR (EFFEXOR XR) 37.5 MG 24 hr capsule, Take 1 capsule (37.5 mg total) by mouth daily with breakfast., Disp: 90 capsule, Rfl: 3 .  vitamin C (ASCORBIC ACID) 500 MG tablet, Take 500 mg by mouth daily., Disp: , Rfl:   No Known Allergies  Objective:   VITALS: Per patient if applicable, see vitals. GENERAL: Alert, appears well and in no acute distress. HEENT: Atraumatic, conjunctiva clear, no obvious abnormalities on inspection of external nose and ears. NECK: Normal movements of the head and neck. CARDIOPULMONARY: No increased WOB. Speaking in clear sentences. I:E  ratio WNL.  MS: Moves all visible extremities without noticeable abnormality. PSYCH: Pleasant and cooperative, well-groomed. Speech normal rate and rhythm. Affect is appropriate. Insight and judgement are appropriate. Attention is focused, linear, and appropriate.  NEURO: CN grossly intact. Oriented as arrived to appointment on time with no prompting. Moves both UE equally.  SKIN: No obvious lesions, wounds, erythema, or cyanosis noted on face or hands.  Assessment and Plan:   There are no  diagnoses linked to this encounter.  . Reviewed expectations re: course of current medical issues. . Discussed self-management of symptoms. . Outlined signs and symptoms indicating need for more acute intervention. . Patient verbalized understanding and all questions were answered. Marland Kitchen Health Maintenance issues including appropriate healthy diet, exercise, and smoking avoidance were discussed with patient. . See orders for this visit as documented in the electronic medical record.  Briscoe Deutscher, DO 04/04/2018

## 2018-04-04 NOTE — Telephone Encounter (Signed)
See note

## 2018-04-05 ENCOUNTER — Ambulatory Visit: Payer: Self-pay | Admitting: Family Medicine

## 2018-04-08 MED FILL — HYDROCHLOROTHIAZIDE 25 MG T: 25 | 90 days supply | Qty: 90 | Fill #0

## 2018-04-17 MED FILL — LEVOTHYROXINE 125 MCG TAB: 125 | 30 days supply | Qty: 30 | Fill #0

## 2018-04-30 ENCOUNTER — Encounter: Payer: Self-pay | Admitting: Family Medicine

## 2018-04-30 ENCOUNTER — Ambulatory Visit (INDEPENDENT_AMBULATORY_CARE_PROVIDER_SITE_OTHER): Payer: 59 | Admitting: Family Medicine

## 2018-04-30 ENCOUNTER — Other Ambulatory Visit: Payer: Self-pay

## 2018-04-30 DIAGNOSIS — E559 Vitamin D deficiency, unspecified: Secondary | ICD-10-CM | POA: Diagnosis not present

## 2018-04-30 DIAGNOSIS — R0789 Other chest pain: Secondary | ICD-10-CM

## 2018-04-30 DIAGNOSIS — J301 Allergic rhinitis due to pollen: Secondary | ICD-10-CM | POA: Diagnosis not present

## 2018-04-30 DIAGNOSIS — Z7189 Other specified counseling: Secondary | ICD-10-CM | POA: Diagnosis not present

## 2018-04-30 DIAGNOSIS — R202 Paresthesia of skin: Secondary | ICD-10-CM

## 2018-04-30 MED ORDER — CYCLOBENZAPRINE HCL 5 MG PO TABS: 5.0000 mg | ORAL_TABLET | Freq: Three times a day (TID) | ORAL | 1 refills | Status: DC | PRN

## 2018-04-30 MED ORDER — MELOXICAM 15 MG PO TABS: 15.0000 mg | ORAL_TABLET | Freq: Every day | ORAL | 1 refills | Status: DC

## 2018-04-30 MED FILL — MELOXICAM 15 MG TABLET: 15 | 30 days supply | Qty: 30 | Fill #0

## 2018-04-30 MED FILL — CYCLOBENZAPRINE HCL 5 MG TA: 5 | 8 days supply | Qty: 24 | Fill #0

## 2018-04-30 NOTE — Progress Notes (Signed)
Virtual Visit via Video   Due to the COVID-19 pandemic, this visit was completed with telemedicine (audio/video) technology to reduce patient and provider exposure as well as to preserve personal protective equipment.   I connected with Diane Alexander by a video enabled telemedicine application and verified that I am speaking with the correct person using two identifiers. Location patient: Home Location provider: [employer], Office Persons participating in the virtual visit: Nida D Greeson, Briscoe Deutscher, DO [name], CMA acting as scribe for Dr. Briscoe Deutscher.   I discussed the limitations of evaluation and management by telemedicine and the availability of in person appointments. The patient expressed understanding and agreed to proceed.  Care Team   Patient Care Team: Briscoe Deutscher, DO as PCP - General [facility]) [facility], [facility]  Subjective:   HPI: Patient has been working out in the yard more raking over the last few weeks. She states she had pain in center of chest and going into right side of chest x 1 week. She denies any SOB or palpitations. She has not had any fever. She has tried ibuprofen, tylenol, ASA heat and ice with not help.  She has started medications for acid reflux but that did not help.  Review of Systems  Constitutional: Negative for chills and fever.  HENT: Negative for hearing loss.   Eyes: Negative for blurred vision.  Respiratory: Negative for cough.   Cardiovascular: Positive for chest pain. Negative for palpitations.  Gastrointestinal: Negative for heartburn and nausea.  Genitourinary: Negative for dysuria and urgency.  Musculoskeletal: Positive for myalgias.  Skin: Negative for rash.  Neurological: Negative for dizziness.  Endo/Heme/Allergies: Does not bruise/bleed easily.  Psychiatric/Behavioral: Negative for depression.    Patient Active Problem List   Diagnosis Date Noted  . Plantar fascia syndrome 02/09/2018    . OA (osteoarthritis) of knee, bilateral 10/31/2017  . Vasomotor symptoms due to menopause 10/31/2017  . Thyromegaly 02/02/2017  . Dry skin dermatitis 02/02/2017  . Obesity (BMI 30-39.9) 02/02/2017  . Adjustment insomnia 02/02/2017  . Postmenopausal estrogen deficiency 02/02/2017  . Essential hypertension 02/02/2017  . Seasonal allergic rhinitis due to pollen 02/02/2017  . Arthralgia of multiple joints 02/02/2017  . Vitamin D deficiency 02/02/2017  . Lactose intolerance 02/02/2017    Social History   Tobacco Use  . Smoking status: Never Smoker  . Smokeless tobacco: Never Used  Substance Use Topics  . Alcohol use: No    Current Outpatient Medications:  .  amLODipine (NORVASC) 5 MG tablet, Take 1 tablet (5 mg total) by mouth daily., Disp: 90 tablet, Rfl: 1 .  B Complex-Biotin-FA (SUPER B-100 PO), Take by mouth., Disp: , Rfl:  .  calcium carbonate (TUMS - DOSED IN MG ELEMENTAL CALCIUM) 500 MG chewable tablet, Chew 1 tablet by mouth daily., Disp: , Rfl:  .  calcium-vitamin D (OSCAL WITH D) 500-200 MG-UNIT tablet, Take 1 tablet by mouth., Disp: , Rfl:  .  cetirizine (ZYRTEC) 10 MG tablet, Take 10 mg by mouth daily., Disp: , Rfl:  .  CHOLINE PO, Take 250 mg by mouth daily as needed., Disp: , Rfl:  .  estradiol (ESTRACE) 2 MG tablet, estradiol 2 mg tablet, Disp: , Rfl:  .  Ferrous Gluconate-C-Folic Acid (IRON-C PO), Take by mouth., Disp: , Rfl:  .  Lactase (LACTAID PO), Take by mouth., Disp: , Rfl:  .  levothyroxine (SYNTHROID, LEVOTHROID) 125 MCG tablet, Take 1 tablet by mouth daily., Disp: , Rfl: 6 .  losartan-hydrochlorothiazide (HYZAAR) 100-25 MG tablet, , Disp: , Rfl:  .  mometasone (NASONEX) 50 MCG/ACT nasal spray, 2 sprays by Nasal route daily.  , Disp: , Rfl:  .  phentermine (ADIPEX-P) 37.5 MG tablet, TAKE ONE TABLET BY MOUTH ONE TIME DAILY BEFORE BREAKFAST, Disp: 30 tablet, Rfl: 2 .  polyethylene glycol (MIRALAX / GLYCOLAX) packet, Take 17 g by mouth daily., Disp: , Rfl:  .   traZODone (DESYREL) 50 MG tablet, Take 0.5-1 tablets (25-50 mg total) by mouth at bedtime as needed for sleep., Disp: 90 tablet, Rfl: 3 .  venlafaxine XR (EFFEXOR XR) 37.5 MG 24 hr capsule, Take 1 capsule (37.5 mg total) by mouth daily with breakfast., Disp: 90 capsule, Rfl: 3 .  vitamin C (ASCORBIC ACID) 500 MG tablet, Take 500 mg by mouth daily., Disp: , Rfl:   No Known Allergies  Objective:   VITALS: Per patient if applicable, see vitals. GENERAL: Alert, appears well and in no acute distress. HEENT: Atraumatic, conjunctiva clear, no obvious abnormalities on inspection of external nose and ears. NECK: Normal movements of the head and neck. CARDIOPULMONARY: No increased WOB. Speaking in clear sentences. I:E ratio WNL.  MS: Moves all visible extremities. TTP left costochondral junction. PSYCH: Pleasant and cooperative, well-groomed. Speech normal rate and rhythm. Affect is appropriate. Insight and judgement are appropriate. Attention is focused, linear, and appropriate.  NEURO: CN grossly intact. Oriented as arrived to appointment on time with no prompting. Moves both UE equally.  SKIN: No obvious lesions, wounds, erythema, or cyanosis noted on face or hands.  Depression screen [facility] 2/9 02/06/2018 01/29/2017  Decreased Interest 0 0  Down, Depressed, Hopeless 0 0  PHQ - 2 Score 0 0  Altered sleeping 3 -  Tired, decreased energy 1 -  Change in appetite 1 -  Feeling bad or failure about yourself  0 -  Trouble concentrating 0 -  Moving slowly or fidgety/restless 0 -  Suicidal thoughts 0 -  PHQ-9 Score 5 -  Difficult doing work/chores Not difficult at all -    Assessment and Plan:   Diane Alexander was seen today for spasms.  Diagnoses and all orders for this visit:  Chest wall pain Comments: Clinically most consistent with chest wall pain.  She was previously prescribed phentermine but has not been taking it.  We discussed red flags.  Treatment as below.  Future labs.  She will let us know if  she is not improving within a week. Orders: -     cyclobenzaprine (FLEXERIL) 5 MG tablet; Take 1 tablet (5 mg total) by mouth 3 (three) times daily as needed for muscle spasms. -     meloxicam (MOBIC) 15 MG tablet; Take 1 tablet (15 mg total) by mouth daily.  Seasonal allergic rhinitis due to pollen  Paresthesia -     CBC with Differential/Platelet; Future -     Comprehensive metabolic panel; Future -     Hemoglobin A1c; Future -     TSH; Future -     Vitamin B12; Future -     Magnesium; Future -     Iron, TIBC and Ferritin Panel; Future  Educated About Covid-19 Virus Infection  Vitamin D deficiency -     VITAMIN D 25 Hydroxy (Vit-D Deficiency, Fractures); Future   . COVID-19 Education: The signs and symptoms of COVID-19 were discussed with the patient and how to seek care for testing if needed. The importance of social distancing was discussed today. . Reviewed expectations re: course of  current medical issues. . Discussed self-management of symptoms. . Outlined signs and symptoms indicating need for more acute intervention. . Patient verbalized understanding and all questions were answered. Marland Kitchen Health Maintenance issues including appropriate healthy diet, exercise, and smoking avoidance were discussed with patient. . See orders for this visit as documented in the electronic medical record.  Briscoe Deutscher, DO 05/04/2018  Records requested if needed. Time spent: 25 minutes, of which >50% was spent in obtaining information about her symptoms, reviewing her previous labs, evaluations, and treatments, counseling her about her condition (please see the discussed topics above), and developing a plan to further investigate it; she had a number of questions which I addressed.

## 2018-05-02 ENCOUNTER — Telehealth: Payer: Self-pay | Admitting: Family Medicine

## 2018-05-02 NOTE — Telephone Encounter (Signed)
See note

## 2018-05-02 NOTE — Telephone Encounter (Signed)
Ok to let her know she can take together?

## 2018-05-02 NOTE — Telephone Encounter (Signed)
Copied from [place] [id number]. Topic: Quick Communication - See Telephone Encounter >> May 02, 2018 12:28 PM Vernona Rieger wrote: CRM for notification. See Telephone encounter for: 05/02/18.  Patient would like to know can she take cyclobenzaprine (FLEXERIL) 5 MG tablet along with venlafaxine XR (EFFEXOR XR) 37.5 MG 24 hr capsule?   Does it take a couple days for it to really work? She said it helped her for two days and now she is back to having issues.

## 2018-05-03 NOTE — Telephone Encounter (Signed)
Patient wanted to let Dr. Juleen China know that she still was not feeling the best even with the 3rd day of the medication prescribed for her enflamed chest. She stated that she would monitor it over the weekend and call Monday if it gets worse.   I advised that if it gets worse before then that we still have patient engagement staff accepting calls on the weekends and she can speak to a nurse if it gets worse prior to Monday. Patient stated she understood and would keep Dr. Alcario Drought team updated.

## 2018-05-03 NOTE — Telephone Encounter (Signed)
Called and informed patient ok to take no questions at this time.

## 2018-05-03 NOTE — Telephone Encounter (Signed)
Okay to take together 

## 2018-05-03 NOTE — Telephone Encounter (Signed)
FYI, please see message. 

## 2018-05-06 NOTE — Progress Notes (Addendum)
Virtual Visit via Video   Due to the COVID-19 pandemic, this visit was completed with telemedicine (audio/video) technology to reduce patient and provider exposure as well as to preserve personal protective equipment.   I connected with Shiann D Harlacher by a video enabled telemedicine application and verified that I am speaking with the correct person using two identifiers. Location patient: Home Location provider: [employer], Office Persons participating in the virtual visit: Nataya D Kruszka, Briscoe Deutscher, DO [name], CMA acting as scribe for Dr. Briscoe Deutscher.   I discussed the limitations of evaluation and management by telemedicine and the availability of in person appointments. The patient expressed understanding and agreed to proceed.  Care Team   Patient Care Team: Briscoe Deutscher, DO as PCP - General (Family Medicine) [facility]  Subjective:   HPI:   Chest wall pain has resolved. NSAIDs and muscle relaxer helped. No new symptoms.   Hx of chronic constipation. Had large BM and felt that it also helped the chest pain. Can now take a deeper breath.  Review of Systems  Constitutional: Negative for chills and fever.  HENT: Negative for hearing loss and tinnitus.   Eyes: Negative for blurred vision and double vision.  Respiratory: Negative for cough and hemoptysis.   Cardiovascular: Negative for chest pain and palpitations.  Gastrointestinal: Negative for nausea and vomiting.  Genitourinary: Negative for dysuria and urgency.  Musculoskeletal: Negative for myalgias.  Skin: Negative for rash.  Neurological: Negative for dizziness.  Endo/Heme/Allergies: Does not bruise/bleed easily.  Psychiatric/Behavioral: Negative for depression.    Patient Active Problem List   Diagnosis Date Noted  . Plantar fascia syndrome 02/09/2018  . OA (osteoarthritis) of knee, bilateral 10/31/2017  . Vasomotor symptoms due to menopause 10/31/2017  . Thyromegaly  02/02/2017  . Dry skin dermatitis 02/02/2017  . Obesity (BMI 30-39.9) 02/02/2017  . Adjustment insomnia 02/02/2017  . Postmenopausal estrogen deficiency 02/02/2017  . Essential hypertension 02/02/2017  . Seasonal allergic rhinitis due to pollen 02/02/2017  . Arthralgia of multiple joints 02/02/2017  . Vitamin D deficiency 02/02/2017  . Lactose intolerance 02/02/2017    Social History   Tobacco Use  . Smoking status: Never Smoker  . Smokeless tobacco: Never Used  Substance Use Topics  . Alcohol use: No    Current Outpatient Medications:  .  amLODipine (NORVASC) 5 MG tablet, Take 1 tablet (5 mg total) by mouth daily., Disp: 90 tablet, Rfl: 1 .  B Complex-Biotin-FA (SUPER B-100 PO), Take by mouth., Disp: , Rfl:  .  calcium carbonate (TUMS - DOSED IN MG ELEMENTAL CALCIUM) 500 MG chewable tablet, Chew 1 tablet by mouth daily., Disp: , Rfl:  .  calcium-vitamin D (OSCAL WITH D) 500-200 MG-UNIT tablet, Take 1 tablet by mouth., Disp: , Rfl:  .  cetirizine (ZYRTEC) 10 MG tablet, Take 10 mg by mouth daily., Disp: , Rfl:  .  CHOLINE PO, Take 250 mg by mouth daily as needed., Disp: , Rfl:  .  cyclobenzaprine (FLEXERIL) 5 MG tablet, Take 1 tablet (5 mg total) by mouth 3 (three) times daily as needed for muscle spasms., Disp: 24 tablet, Rfl: 1 .  estradiol (ESTRACE) 2 MG tablet, estradiol 2 mg tablet, Disp: , Rfl:  .  Ferrous Gluconate-C-Folic Acid (IRON-C PO), Take by mouth., Disp: , Rfl:  .  Lactase (LACTAID PO), Take by mouth., Disp: , Rfl:  .  levothyroxine (SYNTHROID, LEVOTHROID) 125 MCG tablet, Take 1 tablet by mouth daily., Disp: , Rfl: 6 .  losartan-hydrochlorothiazide (HYZAAR) 100-25 MG tablet, , Disp: , Rfl:  .  meloxicam (MOBIC) 15 MG tablet, Take 1 tablet (15 mg total) by mouth daily., Disp: 30 tablet, Rfl: 1 .  mometasone (NASONEX) 50 MCG/ACT nasal spray, 2 sprays by Nasal route daily.  , Disp: , Rfl:  .  phentermine (ADIPEX-P) 37.5 MG tablet, TAKE ONE TABLET BY MOUTH ONE TIME DAILY  BEFORE BREAKFAST, Disp: 30 tablet, Rfl: 2 .  polyethylene glycol (MIRALAX / GLYCOLAX) packet, Take 17 g by mouth daily., Disp: , Rfl:  .  traZODone (DESYREL) 50 MG tablet, Take 0.5-1 tablets (25-50 mg total) by mouth at bedtime as needed for sleep., Disp: 90 tablet, Rfl: 3 .  venlafaxine XR (EFFEXOR XR) 37.5 MG 24 hr capsule, Take 1 capsule (37.5 mg total) by mouth daily with breakfast., Disp: 90 capsule, Rfl: 3 .  vitamin C (ASCORBIC ACID) 500 MG tablet, Take 500 mg by mouth daily., Disp: , Rfl:  .  lubiprostone (AMITIZA) 8 MCG capsule, Take 1 capsule (8 mcg total) by mouth 2 (two) times daily with a meal., Disp: 30 capsule, Rfl: 1  No Known Allergies  Objective:   VITALS: Per patient if applicable, see vitals. GENERAL: Alert, appears well and in no acute distress. HEENT: Atraumatic, conjunctiva clear, no obvious abnormalities on inspection of external nose and ears. NECK: Normal movements of the head and neck. CARDIOPULMONARY: No increased WOB. Speaking in clear sentences. I:E ratio WNL.  MS: Moves all visible extremities without noticeable abnormality. PSYCH: Pleasant and cooperative, well-groomed. Speech normal rate and rhythm. Affect is appropriate. Insight and judgement are appropriate. Attention is focused, linear, and appropriate.  NEURO: CN grossly intact. Oriented as arrived to appointment on time with no prompting. Moves both UE equally.  SKIN: No obvious lesions, wounds, erythema, or cyanosis noted on face or hands.  Depression screen [facility] 2/9 02/06/2018 01/29/2017  Decreased Interest 0 0  Down, Depressed, Hopeless 0 0  PHQ - 2 Score 0 0  Altered sleeping 3 -  Tired, decreased energy 1 -  Change in appetite 1 -  Feeling bad or failure about yourself  0 -  Trouble concentrating 0 -  Moving slowly or fidgety/restless 0 -  Suicidal thoughts 0 -  PHQ-9 Score 5 -  Difficult doing work/chores Not difficult at all -    Assessment and Plan:   Daila was seen today for  follow-up.  Diagnoses and all orders for this visit:  Chronic constipation Comments: [place]-term. Taking grapejuice and Miralax daily. Previously took Amitiza with good results and no side-effects. Will restart.  Orders: -     lubiprostone (AMITIZA) 8 MCG capsule; Take 1 capsule (8 mcg total) by mouth 2 (two) times daily with a meal.  Obesity (BMI 30-39.9) Comments: Gave information to read re: weight loss options. Phentermine with too many side effects. Told to stop.  Essential hypertension Comments: Controlled. Continue current regimen.   Vitamin D deficiency  Chest wall pain Comments: Resolved.    Marland Kitchen COVID-19 Education: The signs and symptoms of COVID-19 were discussed with the patient and how to seek care for testing if needed. The importance of social distancing was discussed today. . Reviewed expectations re: course of current medical issues. . Discussed self-management of symptoms. . Outlined signs and symptoms indicating need for more acute intervention. . Patient verbalized understanding and all questions were answered. Marland Kitchen Health Maintenance issues including appropriate healthy diet, exercise, and smoking avoidance were discussed with patient. . See orders for this visit as  documented in the electronic medical record.  Briscoe Deutscher, DO  Records requested if needed. Time spent: 25 minutes, of which >50% was spent in obtaining information about her symptoms, reviewing her previous labs, evaluations, and treatments, counseling her about her condition (please see the discussed topics above), and developing a plan to further investigate it; she had a number of questions which I addressed.

## 2018-05-07 ENCOUNTER — Other Ambulatory Visit: Payer: Self-pay

## 2018-05-07 ENCOUNTER — Encounter: Payer: Self-pay | Admitting: Family Medicine

## 2018-05-07 ENCOUNTER — Ambulatory Visit (INDEPENDENT_AMBULATORY_CARE_PROVIDER_SITE_OTHER): Payer: 59 | Admitting: Family Medicine

## 2018-05-07 DIAGNOSIS — R0789 Other chest pain: Secondary | ICD-10-CM

## 2018-05-07 DIAGNOSIS — Z114 Encounter for screening for human immunodeficiency virus [HIV]: Secondary | ICD-10-CM

## 2018-05-07 DIAGNOSIS — R202 Paresthesia of skin: Secondary | ICD-10-CM

## 2018-05-07 DIAGNOSIS — E559 Vitamin D deficiency, unspecified: Secondary | ICD-10-CM | POA: Diagnosis not present

## 2018-05-07 DIAGNOSIS — I10 Essential (primary) hypertension: Secondary | ICD-10-CM

## 2018-05-07 DIAGNOSIS — K5909 Other constipation: Secondary | ICD-10-CM

## 2018-05-07 DIAGNOSIS — Z1159 Encounter for screening for other viral diseases: Secondary | ICD-10-CM | POA: Diagnosis not present

## 2018-05-07 DIAGNOSIS — Z9189 Other specified personal risk factors, not elsewhere classified: Secondary | ICD-10-CM

## 2018-05-07 DIAGNOSIS — E669 Obesity, unspecified: Secondary | ICD-10-CM

## 2018-05-07 MED ORDER — LUBIPROSTONE 8 MCG PO CAPS: 8.0000 ug | ORAL_CAPSULE | Freq: Two times a day (BID) | ORAL | 1 refills | Status: DC

## 2018-05-07 NOTE — Patient Instructions (Signed)
GETTING TO GOOD BOWEL HEALTH  Irregular bowel habits such as constipation can lead to many problems over time.  Having one soft bowel movement a day is the most important way to prevent further problems.    The goal: ONE SOFT BOWEL MOVEMENT A DAY!  To have soft, regular bowel movements:  . Drink at least 8 tall glasses of water a day.   . Take plenty of fiber.  Fiber is the undigested part of plant food that passes into the colon, acting s "natures broom" to encourage bowel motility and movement.  Fiber can absorb and hold large amounts of water. This results in a larger, bulkier stool, which is soft and easier to pass. Work gradually over several weeks up to 6 servings a day of fiber (25g a day even more if needed) in the form of: o Vegetables -- Root (potatoes, carrots, turnips), leafy green (lettuce, salad greens, celery, spinach), or cooked high residue (cabbage, broccoli, etc) o Fruit -- Fresh (unpeeled skin & pulp), Dried (prunes, apricots, cherries, etc ),  or stewed ( applesauce)  o Whole grain breads, pasta, etc (whole wheat)  o Bran cereals  . Bulking Agents -- This type of water-retaining fiber generally is easily obtained each day by one of the following:  o Psyllium bran -- The psyllium plant is remarkable because its ground seeds can retain so much water. This product is available as Metamucil, Konsyl, Effersyllium, Per Diem Fiber, or the less expensive generic preparation in drug and health food stores. Although labeled a laxative, it really is not a laxative.  o Methylcellulose -- This is another fiber derived from wood which also retains water. It is available as Citrucel. o Polyethylene Glycol - and "artificial" fiber commonly called Miralax or Glycolax.  It is helpful for people with gassy or bloated feelings with regular fiber o Flax Seed - a less gassy fiber than psyllium . No reading or other relaxing activity while on the toilet. If bowel movements take longer than 5 minutes,  you are too constipated. . AVOID CONSTIPATION.  High fiber and water intake usually takes care of this.  Sometimes a laxative is needed to stimulate more frequent bowel movements, but  . Laxatives are not a good Detjen-term solution as it can wear the colon out. o Osmotics (Milk of Magnesia, Fleets phosphosoda, Magnesium citrate, MiraLax, GoLytely) are safer than  o Stimulants (Senokot, Castor Oil, Dulcolax, Ex Lax)    o Do not take laxatives for more than 7days in a row. .  IF SEVERELY CONSTIPATED, try a Bowel Retraining Program: o Do not use laxatives.  o Eat a diet high in roughage, such as bran cereals and leafy vegetables.  o Drink six (6) ounces of prune or apricot juice each morning.  o Eat two (2) large servings of stewed fruit each day.  o Take one (1) heaping tablespoon of a psyllium-based bulking agent twice a day. Use sugar-free sweetener when possible to avoid excessive calories.  o Eat a normal breakfast.  o Set aside 15 minutes after breakfast to sit on the toilet, but do not strain to have a bowel movement.  o If you do not have a bowel movement by the third day, use an enema and repeat the above steps.   

## 2018-05-08 ENCOUNTER — Other Ambulatory Visit: Payer: 59

## 2018-05-08 ENCOUNTER — Ambulatory Visit: Payer: 59 | Admitting: Family Medicine

## 2018-05-08 ENCOUNTER — Encounter: Payer: Self-pay | Admitting: Family Medicine

## 2018-05-08 ENCOUNTER — Telehealth: Payer: Self-pay | Admitting: Family Medicine

## 2018-05-08 VITALS — BP 125/85 | HR 85 | Temp 97.6°F | Ht 64.0 in | Wt 199.2 lb

## 2018-05-08 DIAGNOSIS — I10 Essential (primary) hypertension: Secondary | ICD-10-CM

## 2018-05-08 DIAGNOSIS — R600 Localized edema: Secondary | ICD-10-CM | POA: Diagnosis not present

## 2018-05-08 DIAGNOSIS — Z114 Encounter for screening for human immunodeficiency virus [HIV]: Secondary | ICD-10-CM | POA: Diagnosis not present

## 2018-05-08 DIAGNOSIS — Z9189 Other specified personal risk factors, not elsewhere classified: Secondary | ICD-10-CM | POA: Diagnosis not present

## 2018-05-08 DIAGNOSIS — R202 Paresthesia of skin: Secondary | ICD-10-CM | POA: Diagnosis not present

## 2018-05-08 DIAGNOSIS — Z1159 Encounter for screening for other viral diseases: Secondary | ICD-10-CM | POA: Diagnosis not present

## 2018-05-08 LAB — COMPREHENSIVE METABOLIC PANEL
ALT: 44 U/L — ABNORMAL HIGH (ref 0–35)
AST: 23 U/L (ref 0–37)
Albumin: 4 g/dL (ref 3.5–5.2)
Alkaline Phosphatase: 73 U/L (ref 39–117)
BUN: 11 mg/dL (ref 6–23)
CO2: 32 mEq/L (ref 19–32)
Calcium: 9.1 mg/dL (ref 8.4–10.5)
Chloride: 101 mEq/L (ref 96–112)
Creatinine, Ser: 0.61 mg/dL (ref 0.40–1.20)
GFR: 121.5 mL/min (ref >60.00)
Glucose, Bld: 99 mg/dL (ref 70–99)
Potassium: 3.8 mEq/L (ref 3.5–5.1)
Sodium: 139 mEq/L (ref 135–145)
Total Bilirubin: 0.4 mg/dL (ref 0.2–1.2)
Total Protein: 7.1 g/dL (ref 6.0–8.3)

## 2018-05-08 LAB — MAGNESIUM: Magnesium: 1.9 mg/dL (ref 1.5–2.5)

## 2018-05-08 LAB — CBC WITH DIFFERENTIAL/PLATELET
Basophils Absolute: 0 10*3/uL (ref 0.0–0.1)
Basophils Relative: 0.4 % (ref 0.0–3.0)
Eosinophils Absolute: 0.1 10*3/uL (ref 0.0–0.7)
Eosinophils Relative: 2.9 % (ref 0.0–5.0)
HCT: 37.7 % (ref 36.0–46.0)
Hemoglobin: 12.3 g/dL (ref 12.0–15.0)
Lymphocytes Relative: 31.4 % (ref 12.0–46.0)
Lymphs Abs: 1.6 10*3/uL (ref 0.7–4.0)
MCHC: 32.8 g/dL (ref 30.0–36.0)
MCV: 85.2 fl (ref 78.0–100.0)
Monocytes Absolute: 0.4 10*3/uL (ref 0.1–1.0)
Monocytes Relative: 8.4 % (ref 3.0–12.0)
Neutro Abs: 2.8 10*3/uL (ref 1.4–7.7)
Neutrophils Relative %: 56.9 % (ref 43.0–77.0)
Platelets: 259 10*3/uL (ref 150.0–400.0)
RBC: 4.42 Mil/uL (ref 3.87–5.11)
RDW: 14.4 % (ref 11.5–15.5)
WBC: 5 10*3/uL (ref 4.0–10.5)

## 2018-05-08 LAB — VITAMIN D 25 HYDROXY (VIT D DEFICIENCY, FRACTURES): VITD: 30.8 ng/mL (ref 30.00–100.00)

## 2018-05-08 LAB — VITAMIN B12: Vitamin B-12: 413 pg/mL (ref 211–911)

## 2018-05-08 LAB — HEMOGLOBIN A1C: Hgb A1c MFr Bld: 5.5 % (ref 4.6–6.5)

## 2018-05-08 LAB — TSH: TSH: <0.01 u[IU]/mL — ABNORMAL LOW (ref 0.35–4.50)

## 2018-05-08 NOTE — Telephone Encounter (Signed)
PA initiated via [email or web address]

## 2018-05-08 NOTE — Progress Notes (Addendum)
   Chief Complaint:  Diane Alexander is a 58 y.o. female who presents for same day appointment with a chief complaint of leg swelling.   Assessment/Plan:  Leg Edema  No red flags.  Possibly side effect of amlodipine.  Will stop today.  Blood pressure seems to be reasonably controlled - think that she will be fine going forward.  Discussed home blood pressure monitoring with goal 140/90 or lower.  Also discussed importance of sodium restriction, leg elevation, and frequent exercise.  Will be getting blood work today including CBC, CMET and TSH. Discussed reasons to return to care and seek emergent care. Follow-up with PCP if not improving.  Essential hypertension Stop amlodipine as noted above.  Continue losartan-hctz.  Continue home blood pressure monitoring with goal 140/90 or lower.  Advised her to follow-up with PCP in 1 to 2 weeks for blood pressure recheck.     Subjective:  HPI:  Leg Swelling, acute problem Patient noticed swelling to her bilateral legs yesterday evening.  She is concerned that it could be due to underlying heart issue.  She also had elevated heart rate to 106 at that time.  Blood pressure is been well controlled into the 120s 130s over 80s.  She has been eating more popcorn lately but does not think that this has been a significant cause of her leg swelling.  No pain to the area.  No trauma or obvious precipitating events. Swelling located to bilateral shins up to her knees.  No reported orthopnea or PND. No chest pain or shortness of breath. No other obvious alleviating or aggravating factors.    ROS: Per HPI  PMH: She reports that she has never smoked. She has never used smokeless tobacco. She reports that she does not drink alcohol or use drugs.      Objective:  Physical Exam: BP 125/85 (BP Location: Left Arm, Patient Position: Sitting, Cuff Size: Normal)   Pulse 85   Temp 97.6 F (36.4 C) (Oral)   Ht 5\' 4"  (1.626 m)   Wt 199 lb 3.2 oz (90.4 kg)   SpO2 100%    BMI 34.19 kg/m   Gen: NAD, resting comfortably CV: Regular rate and rhythm with no murmurs appreciated Pulm: Normal work of breathing, clear to auscultation bilaterally with no crackles, wheezes, or rhonchi MSK: 1+ pitting edema to knees bilaterally     Caleb M. [name], MD 05/08/2018 10:46 AM

## 2018-05-08 NOTE — Addendum Note (Signed)
Addended by: Francis Dowse T on: 05/08/2018 11:03 AM   Modules accepted: Orders

## 2018-05-08 NOTE — Telephone Encounter (Signed)
Patient is scheduled for today at 11am.   Patient Name: Diane Alexander Gender: Female DOB: 1960-10-12  Age: 58 Y 11 M 9 D Return Phone Number: [phone] (Primary) Address:  City/State/Zip: [place]  [address] Client [place] [facility] [employer] Physician Briscoe Deutscher- DO Contact Type Call Who Is Calling Patient / Member / Family / Caregiver Call Type Triage / Clinical Relationship To Patient Self Return Phone Number [phone] (Primary) Chief Complaint Leg Swelling And Edema Reason for Call Symptomatic / Request for [place] states she has fluid in her legs, pulse 106, BP 131/81. Translation No Nurse Assessment Nurse: Garnetta Buddy, RN, Cyndi Date/Time (Eastern Time): 05/07/2018 8:50:08 PM Confirm and document reason for call. If symptomatic, describe symptoms. ---Caller states she has fluid in her legs on the side of her knees. Reports she takes amlodipine, Losartan/potassium, HCTZ, cyclobenzaprine and meloxicam. States she was being treated for muscle spasms as well. Caller reports no sob or any other symptoms. States she is just anxious because of the swelling in her knees. Caller states she is going to PCP office tomorrow for blood work and requests to see the doctor to assess her knees. Has the patient had close contact with a person known or suspected to have the novel coronavirus illness OR traveled / lives in area with major community spread (including international travel) in the last 14 days from the onset of symptoms? * If Asymptomatic, screen for exposure and travel within the last 14 days. ---No Does the patient have any new or worsening symptoms? ---Yes Will a triage be completed? ---Yes Related visit to physician within the last 2 weeks? ---No Does the PT have any chronic conditions? (i.e. diabetes, asthma, this includes High risk factors for pregnancy,  etc.) ---Yes List chronic conditions. ---HTN, Hyperthyroidism Is this a behavioral health or substance abuse call? ---No Guidelines Guideline Title Affirmed Question Affirmed Notes Nurse Date/Time Eilene Ghazi Time) Knee Swelling [1] Very swollen joint AND [2] no fever  Garnetta Buddy, RN, Cyndi 05/07/2018 8:58:07 PM PLEASE NOTE:  All timestamps contained within this report are represented as [place] Standard Time. CONFIDENTIALTY NOTICE: This fax transmission is intended only for the addressee.  It contains information that is legally privileged, confidential or otherwise protected from use or disclosure.  If you are not the intended recipient, you are strictly prohibited from reviewing, disclosing, copying using or disseminating any of this information or taking any action in reliance on or regarding this information.  If you have received this fax in error, please notify us immediately by telephone so that we can arrange for its return to [place]. Phone:  [phone], Toll-Free:  [phone], Fax:  [phone] Page: 2 of 2 Call Id: [id number] [place]. Time Eilene Ghazi Time) Disposition Final User 05/07/2018 9:00:55 PM See PCP within 24 Hours Yes Garnetta Buddy, RN, [employer]     Caller Disagree/Comply Comply Caller Understands Yes PreDisposition Did not know what to do Care Advice Given Per Guideline SEE PCP WITHIN 24 HOURS: ACE WRAP: * Apply an ace wrap loosely around the knee. Wrap from bottom to top. REST YOUR KNEE for the next couple days: * Avoid activities that cause any pain. * Reduce activities that put a lot of strain on the knee joint (e.g., deep knee bends, stair climbing, running). CALL BACK IF: * You develop a fever * Knee becomes red or very painful * You become worse. CARE ADVICE given per Knee Joint Swelling (Adult)  guideline. Referrals REFERRED TO PCP OFFICE

## 2018-05-08 NOTE — Telephone Encounter (Signed)
Patient was scheduled and seen today. 

## 2018-05-08 NOTE — Telephone Encounter (Signed)
Copied from [place] [id number]. Topic: Quick Communication - Rx Refill/Question >> May 08, 2018  3:45 PM Margot Ables wrote: Medication: lubiprostone (AMITIZA) 8 MCG capsule - pt states PA is required or needs to trial Linzess. Pt is ok to try Linzess first.   Has the patient contacted their pharmacy? Yes - advised of above Preferred Pharmacy (with phone number or street name): [place]

## 2018-05-08 NOTE — Telephone Encounter (Signed)
See note

## 2018-05-08 NOTE — Patient Instructions (Addendum)
It was very nice to see you today!  Please stop the amlodipine. Please watch your sodium intake.  Try to limit to no more than 1500 mg/day.  Please keep an eye on your blood pressure and let me or Dr. Juleen China know if persistently 140/90 or higher.  Take care, Dr [name]

## 2018-05-09 LAB — IRON,TIBC AND FERRITIN PANEL
%SAT: 21 % (calc) (ref 16–45)
Ferritin: 351 ng/mL — ABNORMAL HIGH (ref 16–232)
Iron: 53 ug/dL (ref 45–160)
TIBC: 256 mcg/dL (calc) (ref 250–450)

## 2018-05-09 LAB — HEPATITIS C ANTIBODY
Hepatitis C Ab: NONREACTIVE
SIGNAL TO CUT-OFF: 0.15 (ref <1.00)

## 2018-05-09 LAB — HIV ANTIBODY (ROUTINE TESTING W REFLEX): HIV 1&2 Ab, 4th Generation: NONREACTIVE

## 2018-05-10 ENCOUNTER — Telehealth: Payer: Self-pay | Admitting: Family Medicine

## 2018-05-10 MED ORDER — LINACLOTIDE 72 MCG PO CAPS: 72.0000 ug | ORAL_CAPSULE | Freq: Every day | ORAL | 1 refills | Status: DC

## 2018-05-10 MED FILL — LINZESS 72 MCG CAPSULE: 72 | 30 days supply | Qty: 30 | Fill #0

## 2018-05-10 NOTE — Telephone Encounter (Signed)
Rx for Linzess 72 mcg sent to [facility].

## 2018-05-10 NOTE — Addendum Note (Signed)
Addended by: Jasper Loser on: 05/10/2018 12:04 PM   Modules accepted: Orders

## 2018-05-10 NOTE — Telephone Encounter (Signed)
Yes, off of Phentermine. Am I the one adjusting her thyroid medication now? If yes, will need to decrease dose. Let me know and I will send in new dose.

## 2018-05-10 NOTE — Telephone Encounter (Signed)
Yes, lowest dose.

## 2018-05-10 NOTE — Telephone Encounter (Signed)
Copied from [place] [id number]. Topic: General - Other >> May 10, 2018 11:40 AM Leward Quan A wrote: Reason for CRM: Patient called to say that due to the results of her blood work does she need to come off the  phentermine (ADIPEX-P) 37.5 MG tablet or any other medications that she is on. Please call patient with the answer Ph# [phone]

## 2018-05-10 NOTE — Telephone Encounter (Signed)
Forwarding to Dr. Juleen China to advise.   This is in regards to her thyroid function.

## 2018-05-10 NOTE — Telephone Encounter (Signed)
Called pt and advised to hold off on taking Phentermine for now. Pt verbalized understanding. She also says that she was taking a herb for weight loss and she has d/c that too. Lab results were faxed to Dr. Chalmers Cater and pt is waiting on a return call from Dr. Chalmers Cater to advise of new dose for thyroid medication. She will let Dr. Juleen China know if she still has not heard from Dr. Chalmers Cater by the time up her upcoming appointment with Dr. Juleen China on 05/15/18.   Forwarding to Dr. Juleen China as Juluis Rainier.

## 2018-05-10 NOTE — Telephone Encounter (Signed)
Dr. Juleen China, [place] to change to Linzess per pt request?

## 2018-05-10 NOTE — Telephone Encounter (Signed)
Diane Alexander (Key: [id number])   This request has received a Unfavorable outcome. Please note any additional information provided by [employer] at the bottom of this request.

## 2018-05-15 ENCOUNTER — Ambulatory Visit (INDEPENDENT_AMBULATORY_CARE_PROVIDER_SITE_OTHER): Payer: 59 | Admitting: Family Medicine

## 2018-05-15 ENCOUNTER — Other Ambulatory Visit: Payer: Self-pay

## 2018-05-15 DIAGNOSIS — E890 Postprocedural hypothyroidism: Secondary | ICD-10-CM

## 2018-05-15 DIAGNOSIS — E669 Obesity, unspecified: Secondary | ICD-10-CM

## 2018-05-15 DIAGNOSIS — N951 Menopausal and female climacteric states: Secondary | ICD-10-CM | POA: Diagnosis not present

## 2018-05-15 DIAGNOSIS — I10 Essential (primary) hypertension: Secondary | ICD-10-CM

## 2018-05-15 NOTE — Progress Notes (Signed)
Virtual Visit via Video   Due to the COVID-19 pandemic, this visit was completed with telemedicine (audio/video) technology to reduce patient and provider exposure as well as to preserve personal protective equipment.   I connected with Diane Alexander by a video enabled telemedicine application and verified that I am speaking with the correct person using two identifiers. Location patient: Home Location provider: [facility], Office Persons participating in the virtual visit: Annjanette Wertenberger Tatum, Briscoe Deutscher, DO   I discussed the limitations of evaluation and management by telemedicine and the availability of in person appointments. The patient expressed understanding and agreed to proceed.  Care Team   Patient Care Team: Briscoe Deutscher, DO as PCP - General (Family Medicine) [facility]  Subjective:   HPI: Note: All symptoms improving since labs revealed hyperthyroid. Patient held medication for 4 days until hearing from endocrinology. Dose decreased to Levothyroxine 112 mcg. Patient admits that she had also been using a diet pill that contained green tea extract and phentermine prn even after told to stop. Feels much better with lower thyroid function and DC of stimulants.  Leg Edema  Amlodipine d/c 05/08/2018 by Dr. [name]. Dr. [name] also discussed importance of sodium restriction, leg elevation, and frequent exercise.  Had CBC, CMET and TSH done 05/08/2018.   Essential hypertension Taking losartan-hctz.   BP Readings from Last 3 Encounters:  05/08/18 125/85  02/06/18 120/80  12/21/17 110/70   Lab Results  Component Value Date   CREATININE 0.61 05/08/2018   CREATININE 0.74 10/02/2017     Review of Systems  Constitutional: Negative for chills, fever, malaise/fatigue and weight loss.  Respiratory: Negative for cough, shortness of breath and wheezing.   Cardiovascular: Negative for chest pain, palpitations and leg swelling.  Gastrointestinal: Negative for  abdominal pain, constipation, diarrhea, nausea and vomiting.  Genitourinary: Negative for dysuria and urgency.  Musculoskeletal: Negative for joint pain and myalgias.  Skin: Negative for rash.  Neurological: Negative for dizziness and headaches.  Psychiatric/Behavioral: Negative for depression, substance abuse and suicidal ideas. The patient is not nervous/anxious.      Patient Active Problem List   Diagnosis Date Noted  . Postablative hypothyroidism 05/20/2018  . Plantar fascia syndrome 02/09/2018  . OA (osteoarthritis) of knee, bilateral 10/31/2017  . Vasomotor symptoms due to menopause 10/31/2017  . Thyromegaly 02/02/2017  . Dry skin dermatitis 02/02/2017  . Obesity (BMI 30-39.9) 02/02/2017  . Adjustment insomnia 02/02/2017  . Postmenopausal estrogen deficiency 02/02/2017  . Essential hypertension 02/02/2017  . Seasonal allergic rhinitis due to pollen 02/02/2017  . Arthralgia of multiple joints 02/02/2017  . Vitamin D deficiency 02/02/2017  . Lactose intolerance 02/02/2017    Social History   Tobacco Use  . Smoking status: Never Smoker  . Smokeless tobacco: Never Used  Substance Use Topics  . Alcohol use: No    Current Outpatient Medications:  .  B Complex-Biotin-FA (SUPER B-100 PO), Take by mouth., Disp: , Rfl:  .  calcium carbonate (TUMS - DOSED IN MG ELEMENTAL CALCIUM) 500 MG chewable tablet, Chew 1 tablet by mouth daily., Disp: , Rfl:  .  calcium-vitamin D (OSCAL WITH D) 500-200 MG-UNIT tablet, Take 1 tablet by mouth., Disp: , Rfl:  .  cetirizine (ZYRTEC) 10 MG tablet, Take 10 mg by mouth daily., Disp: , Rfl:  .  CHOLINE PO, Take 250 mg by mouth daily as needed., Disp: , Rfl:  .  cyclobenzaprine (FLEXERIL) 5 MG tablet, Take 1 tablet (5 mg total) by  mouth 3 (three) times daily as needed for muscle spasms., Disp: 24 tablet, Rfl: 1 .  estradiol (ESTRACE) 2 MG tablet, estradiol 2 mg tablet, Disp: , Rfl:  .  Ferrous Gluconate-C-Folic Acid (IRON-C PO), Take by mouth., Disp:  , Rfl:  .  Lactase (LACTAID PO), Take by mouth., Disp: , Rfl:  .  levothyroxine (SYNTHROID, LEVOTHROID) 125 MCG tablet, Take 1 tablet by mouth daily., Disp: , Rfl: 6 .  linaclotide (LINZESS) 72 MCG capsule, Take 1 capsule (72 mcg total) by mouth daily before breakfast., Disp: 30 capsule, Rfl: 1 .  losartan-hydrochlorothiazide (HYZAAR) 100-25 MG tablet, , Disp: , Rfl:  .  meloxicam (MOBIC) 15 MG tablet, Take 1 tablet (15 mg total) by mouth daily., Disp: 30 tablet, Rfl: 1 .  mometasone (NASONEX) 50 MCG/ACT nasal spray, 2 sprays by Nasal route daily.  , Disp: , Rfl:  .  phentermine (ADIPEX-P) 37.5 MG tablet, TAKE ONE TABLET BY MOUTH ONE TIME DAILY BEFORE BREAKFAST, Disp: 30 tablet, Rfl: 2 .  polyethylene glycol (MIRALAX / GLYCOLAX) packet, Take 17 g by mouth daily., Disp: , Rfl:  .  traZODone (DESYREL) 50 MG tablet, Take 0.5-1 tablets (25-50 mg total) by mouth at bedtime as needed for sleep., Disp: 90 tablet, Rfl: 3 .  venlafaxine XR (EFFEXOR XR) 37.5 MG 24 hr capsule, Take 1 capsule (37.5 mg total) by mouth daily with breakfast., Disp: 90 capsule, Rfl: 3 .  vitamin C (ASCORBIC ACID) 500 MG tablet, Take 500 mg by mouth daily., Disp: , Rfl:   No Known Allergies  Objective:   VITALS: Per patient if applicable, see vitals. GENERAL: Alert, appears well and in no acute distress. HEENT: Atraumatic, conjunctiva clear, no obvious abnormalities on inspection of external nose and ears. NECK: Normal movements of the head and neck. CARDIOPULMONARY: No increased WOB. Speaking in clear sentences. I:E ratio WNL.  MS: Moves all visible extremities without noticeable abnormality. PSYCH: Pleasant and cooperative, well-groomed. Speech normal rate and rhythm. Affect is appropriate. Insight and judgement are appropriate. Attention is focused, linear, and appropriate.  NEURO: CN grossly intact. Oriented as arrived to appointment on time with no prompting. Moves both UE equally.  SKIN: No obvious lesions, wounds,  erythema, or cyanosis noted on face or hands.  Depression screen [facility] 2/9 02/06/2018 01/29/2017  Decreased Interest 0 0  Down, Depressed, Hopeless 0 0  PHQ - 2 Score 0 0  Altered sleeping 3 -  Tired, decreased energy 1 -  Change in appetite 1 -  Feeling bad or failure about yourself  0 -  Trouble concentrating 0 -  Moving slowly or fidgety/restless 0 -  Suicidal thoughts 0 -  PHQ-9 Score 5 -  Difficult doing work/chores Not difficult at all -    Assessment and Plan:   Diagnoses and all orders for this visit:  Postablative hypothyroidism Comments: Patient would likve new Endocrinologist citing poor communication with Dr. Chalmers Cater. Orders: -     Ambulatory referral to Endocrinology  Essential hypertension Comments: Controlled on current regimen.   Vasomotor symptoms due to menopause Comments: Controlled on current regimen.   Obesity (BMI 30-39.9) Comments: Patient wants to work on weight loss after stabilizing.    Marland Kitchen COVID-19 Education: The signs and symptoms of COVID-19 were discussed with the patient and how to seek care for testing if needed. The importance of social distancing was discussed today. . Reviewed expectations re: course of current medical issues. . Discussed self-management of symptoms. . Outlined signs and symptoms indicating need  for more acute intervention. . Patient verbalized understanding and all questions were answered. Marland Kitchen Health Maintenance issues including appropriate healthy diet, exercise, and smoking avoidance were discussed with patient. . See orders for this visit as documented in the electronic medical record.  Briscoe Deutscher, DO  Records requested if needed. Time spent: 25 minutes, of which >50% was spent in obtaining information about her symptoms, reviewing her previous labs, evaluations, and treatments, counseling her about her condition (please see the discussed topics above), and developing a plan to further investigate it; she had a number  of questions which I addressed.

## 2018-05-20 ENCOUNTER — Telehealth: Payer: Self-pay | Admitting: Family Medicine

## 2018-05-20 ENCOUNTER — Encounter: Payer: Self-pay | Admitting: Family Medicine

## 2018-05-20 DIAGNOSIS — E890 Postprocedural hypothyroidism: Secondary | ICD-10-CM | POA: Insufficient documentation

## 2018-05-20 NOTE — Telephone Encounter (Signed)
pls see message and advise 

## 2018-05-20 NOTE — Telephone Encounter (Signed)
See note  Copied from [place] [id number]. Topic: General - Other >> May 20, 2018  3:02 PM Leward Quan A wrote: Reason for CRM: Patient called to ask Dr Juleen China if it is ok to take the medication recommended for her hot flashes asking. Also asking about the referral to a new Endocrinologist. Patient said that she had hot flashes all weekend but did not take anything in fear that it may collide with the levothyroxine (SYNTHROID, LEVOTHROID) 125 MCG tablet. She is asking for a call back when ever possible please to discuss these issues. Ph# [phone]

## 2018-05-20 NOTE — Telephone Encounter (Signed)
She is talking about her effexor, correct? Okay to take.

## 2018-05-21 ENCOUNTER — Encounter: Payer: Self-pay | Admitting: Family Medicine

## 2018-05-21 ENCOUNTER — Telehealth: Payer: Self-pay

## 2018-05-21 NOTE — Telephone Encounter (Addendum)
[facility] and left message to the me back regarding status of referral.   Called pt and advised that I am waiting to hear back from Endocrinology regarding referral.

## 2018-05-21 NOTE — Telephone Encounter (Signed)
Called patient [email or web address]

## 2018-05-21 NOTE — Telephone Encounter (Signed)
I am concerned about my overactive thyroid. I have been retaining fluid in my legs. I spoke with the physician at your site on my last visit and I told her I was retaining fluid. Even after taking my blood pressure medicine I am not releasing water like I use to. I feel terrible and was wondering if you had an opportunity to request an appointment with the endocrinologist?  I am having severe hot flashes, my throat feels funny, my joints aches, I am extremely tired and it feels like my heart is racing all the time.  I do not know how to function with this issue, it is very difficult and I would at least like to get some type of coaching to help me.  I can do beside take the levothyroxine?  Since May 9th I am taking the following medicine: levothyroxin, amlodipine, losartan, hydrochlorothiazide and trazodone (not every night).   Please share if it is okay to take any of the other medicines that have been prescribed for me.   I have had issues with my vitamin D level being low and the test result shows that my vitamin D level is low, should I be consuming a higher dose of vitamin D?   TIBC 256 mcg/dL (calc) 250 - 450 mcg/dL (calc)  What is a TIBC, is it related to calcium? This value is low, should I be increasing my calcium?   Ferritin 351 ng/mL 16 - 232 ng/mL  This value is high, what should I be doing to lower or get within the standard range?   Please discuss the following with me and what should I be doing to bring these numbers within the range:  I talked with you about numbness in toes, I have suffered with low potassium in the past.   Potassium    3.8 mEq/L      3.5 - 5.1 mEq/L  CO2        32 mEq/L      19 - 32 mEq/L  Glucose, Bld 99 mg/dL      70 - 99 mg/d  ALT            44 U/L          0 - 35 U/L  GFR     121.50 mL/min >60.00 mL/min

## 2018-05-21 NOTE — Progress Notes (Signed)
Diane Alexander is a 58 y.o. female is here for follow up.  Assessment and Plan:   Hypothyroidism following RAI 12/2016, on Levothyroxine Lab Results  Component Value Date   TSH <0.01 (L) 05/08/2018   Hx of hyperthyroidism, Dx in 2018, RAI with Dr. Chalmers Cater. Controlled on Levothyroxine since that time.  Patient developed palpitations, hair loss, insomnia, anxiety, and lower extremity edema during Spring 2020. Had been working on weight loss and Rx phentermine with good results prior to that. Phentermine and patient's diet supplement containing green tea extract were discontinued but no improvement. TSH checked on 05/08/18 was <0.01. Patient had been managed by Dr. Chalmers Cater so labs faxed to her office and patient called for dosing instructions. Unfortunately, she did not hear anything for several days, so patient self-discontinued her Levothyroxine. Started to feel a little better. Dr. Chalmers Cater contacted her and Rx Levothyroxine 112 mcg daily. She has remained on that dose since that time.   Due to fear of medication interactions, patient discontinued all medications (with the exception of her HTN medications). This meant that she stopped her Effexor, Linzess, Trazodone, and Mobic. She developed worsening symptoms of hot flashes, anxiety, chest wall/left shoulder pain, lower extremity edema, irritability, and exercise intolerance. She became very frustrated that she was not getting helpful answers. A referral to a new Endocrinologist was in process but no call yet.   Patient works very hard to maintain a healthy diet (plant-based, low salt). She walks for exercise and will jog when she feels well. She does not like to take medications, preferring to maintain a healthy lifestyle.   Plan: Patient is worried about rapid weight gain if Levothyroxine decreased too quickly. Will continue current dose and recheck labs (TSH, free T4) 4-6 weeks after dose change. Future labs have already been ordered. Will discuss  together after. We also discussed her request to see a new Endocrinologist. She is interested in someone local but prefers [facility].   Essential hypertension, Rx Amlodipine, Losartan-HCTZ Review: taking medications as instructed, no medication side effects noted, noted swelling of ankles. Smoker: No.  Walks for exercise. Plant based, low-salt diet.   BP Readings from Last 3 Encounters:  05/22/18 110/62  05/08/18 125/85  02/06/18 120/80   Lab Results  Component Value Date   CREATININE 0.61 05/08/2018   CREATININE 0.74 10/02/2017     Plan: Well controlled.  No signs of complications, medication side effects, or red flags.  Continue current regimen.    Vasomotor symptoms due to menopause, Rx Effexor On Estrogen prior to Effexor, Rx by GYN, Dr. Corinna Capra. Stopped 2/2 Dr. Almetta Lovely concern re: increased risk of cardiovascular complications. Effexor helps to control vasomotor symptoms. She stopped Effexor last week due to fear of interaction with Levothyroxine and overactive thyroid. We discussed the importance of maintaining this medication as she is likely experiencing discontinuation syndrome.   Arthralgia of multiple joints, with negative Rheum panel, controlled with Mobic Negative Rheum panel. Working on weight loss to help with this (especially knees). Mobic has been very helpful in the past. Will restart. Can consider joint injections as needed.   Obesity (BMI 30-39.9) Body mass index is 33.81 kg/m.  Wt Readings from Last 3 Encounters:  05/22/18 197 lb (89.4 kg)  05/08/18 199 lb 3.2 oz (90.4 kg)  02/06/18 208 lb 6.4 oz (94.5 kg)   Patient has been working hard to lose weight and is worried about gaining. We will be mindful of this while adjust Levothyroxine.   Constipation, chronic  idiopathic, Rx Linzess as needed Bowel regimen includes increased fiber, Miralax, apple juice in each morning, and Linzess as needed. When she has an increased stool burden, she has increased LUQ discomfort and  some increased shortness of breath.   Elevated LFTs Lab Results  Component Value Date   ALT 44 (H) 05/08/2018   AST 23 05/08/2018   ALKPHOS 73 05/08/2018   BILITOT 0.4 05/08/2018   CT abdomen 2016: Hepatobiliary: No focal hepatic lesions or intrahepatic biliary dilatation. The gallbladder is surgically absent. Mild associated common bile duct dilatation, within normal limits.  Plan: RUQ [place] to evaluate liver and common bile duct.   Vitamin D deficiency, taking 2000 IU daily Hx of severely low vitamin D, s/p high dose supplements. Now taking 2000 IU daily.   Atypical chest pain Patient with consistent intermittent left-sided chest wall pain with radiating to her left shoulder. Previously improved with Mobic. Today, EKG and CXR completed. EKG: normal EKG, normal sinus rhythm. CXR with no acute process, cardiomegaly, or fluid. Vitals WNL.   Need updated lipid panel. Will include with upcoming thyroid studies.  Orders Placed This Encounter  Procedures  . DG Chest 2 View  . US ABDOMEN LIMITED RUQ  . Lipid panel  . Ambulatory referral to Endocrinology  . EKG 12-Lead   Health Maintenance:   Health Maintenance  Topic Date Due  . MAMMOGRAM  10/09/2019  . COLONOSCOPY  07/11/2021  . TETANUS/TDAP  06/02/2026  . Hepatitis C Screening  Completed  . HIV Screening  Completed   PMHx, SurgHx, SocialHx, FamHx, Medications, and Allergies were reviewed in the Visit Navigator and updated as appropriate.   Patient Active Problem List   Diagnosis Date Noted  . Constipation, chronic idiopathic, Rx Linzess as needed 05/23/2018  . Elevated LFTs 05/23/2018  . History of cholecystectomy 05/23/2018  . Diverticulosis 05/23/2018  . Atypical chest pain 05/23/2018  . Hypothyroidism following RAI 12/2016, on Levothyroxine 05/20/2018  . Plantar fascia syndrome 02/09/2018  . OA (osteoarthritis) of knee, bilateral 10/31/2017  . Vasomotor symptoms due to menopause, Rx Effexor 10/31/2017  . Dry skin  dermatitis 02/02/2017  . Obesity (BMI 30-39.9) 02/02/2017  . Adjustment insomnia 02/02/2017  . Postmenopausal estrogen deficiency 02/02/2017  . Essential hypertension, Rx Amlodipine, Losartan-HCTZ 02/02/2017  . Seasonal allergic rhinitis due to pollen 02/02/2017  . Arthralgia of multiple joints, with negative Rheum panel, controlled with Mobic 02/02/2017  . Vitamin D deficiency, taking 2000 IU daily 02/02/2017  . Lactose intolerance 02/02/2017   Social History   Tobacco Use  . Smoking status: Never Smoker  . Smokeless tobacco: Never Used  Substance Use Topics  . Alcohol use: No  . Drug use: No   Current Medications and Allergies:   Current Outpatient Medications:  .  amLODipine (NORVASC) 5 MG tablet, Take 1 tablet (5 mg total) by mouth daily., Disp: 90 tablet, Rfl: 2 .  B Complex-Biotin-FA (SUPER B-100 PO), Take by mouth., Disp: , Rfl:  .  calcium carbonate (TUMS - DOSED IN MG ELEMENTAL CALCIUM) 500 MG chewable tablet, Chew 1 tablet by mouth daily., Disp: , Rfl:  .  calcium-vitamin D (OSCAL WITH D) 500-200 MG-UNIT tablet, Take 1 tablet by mouth., Disp: , Rfl:  .  cetirizine (ZYRTEC) 10 MG tablet, Take 10 mg by mouth daily., Disp: , Rfl:  .  CHOLINE PO, Take 250 mg by mouth daily as needed., Disp: , Rfl:  .  estradiol (ESTRACE) 2 MG tablet, estradiol 2 mg tablet, Disp: , Rfl:  .  Ferrous Gluconate-C-Folic Acid (IRON-C PO), Take by mouth., Disp: , Rfl:  .  Lactase (LACTAID PO), Take by mouth., Disp: , Rfl:  .  levothyroxine (SYNTHROID) 112 MCG tablet, Take 1 tablet (112 mcg total) by mouth daily., Disp: 90 tablet, Rfl: 3 .  linaclotide (LINZESS) 72 MCG capsule, Take 1 capsule (72 mcg total) by mouth daily before breakfast., Disp: 30 capsule, Rfl: 1 .  losartan-hydrochlorothiazide (HYZAAR) 100-25 MG tablet, Take 1 tablet by mouth daily., Disp: 90 tablet, Rfl: 3 .  meloxicam (MOBIC) 15 MG tablet, Take 1 tablet (15 mg total) by mouth daily., Disp: 30 tablet, Rfl: 1 .  mometasone  (NASONEX) 50 MCG/ACT nasal spray, 2 sprays by Nasal route daily.  , Disp: , Rfl:  .  polyethylene glycol (MIRALAX / GLYCOLAX) packet, Take 17 g by mouth daily., Disp: , Rfl:  .  traZODone (DESYREL) 50 MG tablet, Take 0.5-1 tablets (25-50 mg total) by mouth at bedtime as needed for sleep., Disp: 90 tablet, Rfl: 3 .  venlafaxine XR (EFFEXOR XR) 37.5 MG 24 hr capsule, Take 1 capsule (37.5 mg total) by mouth daily with breakfast., Disp: 90 capsule, Rfl: 3 .  vitamin C (ASCORBIC ACID) 500 MG tablet, Take 500 mg by mouth daily., Disp: , Rfl:   No Known Allergies   Review of Systems   Pertinent items are noted in the HPI. Otherwise, ROS is negative.  Vitals:   Vitals:   05/22/18 1510  BP: 110/62  Pulse: 87  Temp: 98.7 F (37.1 C)  TempSrc: Oral  SpO2: 98%  Weight: 197 lb (89.4 kg)  Height: 5\' 4"  (1.626 m)     Body mass index is 33.81 kg/m.   Physical Exam:   General: Cooperative, alert and oriented, well developed, well nourished, in no acute distress. HEENT: Conjunctivae and lids unremarkable. No pallor or cyanosis. Neck: No thyromegaly.  Cardiovascular: Regular rhythm. No murmurs appreciated.  Lungs: Normal work of breathing. Clear bilaterally without rales, rhonchi, or wheezing.  Abdomen: Soft, nontender, no masses. Normal bowel sounds. Extremities: No clubbing, cyanosis, erythema. Minimal edema. Skin: Warm and dry. Thinning of eyebrows and eyelashes.  Neurologic: No focal deficits.  Psychiatric: Normal affect and thought content. Frustrated.   . Reviewed expectations re: course of current medical issues. . Discussed self-management of symptoms. . Outlined signs and symptoms indicating need for more acute intervention. . Patient verbalized understanding and all questions were answered. . See orders for this visit as documented in the electronic medical record. . Patient received an After Visit Summary.  Briscoe Deutscher, DO [place] 05/23/2018

## 2018-05-21 NOTE — Telephone Encounter (Signed)
Spoke with patient at length and let her express all of her concerns and questions regarding her lab results and why she has not been contacted by endocrinology yet. After listening to patients concerns for about 20 minutes, we decided that the best course of action would be for pt to have virtual visit scheduled with Dr. Juleen China to discuss her concerns about her medications, lab results, and referral. I have scheduled pt for a 40 minute visit with Dr. Juleen China tomorrow to discuss these concerns as pt voiced that she does not want to be rushed off of the phone/visit. I also advised pt that I would contact Dr. Cordelia Pen office to see where they are in the process of getting her scheduled. I explained the referral process to pt as she has concerns about it taking so Gertz to be contacted about scheduling.   Forwarding to Dr. Juleen China as Juluis Rainier.

## 2018-05-22 ENCOUNTER — Ambulatory Visit (INDEPENDENT_AMBULATORY_CARE_PROVIDER_SITE_OTHER): Payer: 59

## 2018-05-22 ENCOUNTER — Ambulatory Visit: Payer: 59 | Admitting: Family Medicine

## 2018-05-22 ENCOUNTER — Other Ambulatory Visit: Payer: Self-pay

## 2018-05-22 ENCOUNTER — Encounter: Payer: Self-pay | Admitting: Family Medicine

## 2018-05-22 VITALS — BP 110/62 | HR 87 | Temp 98.7°F | Ht 64.0 in | Wt 197.0 lb

## 2018-05-22 DIAGNOSIS — Z9049 Acquired absence of other specified parts of digestive tract: Secondary | ICD-10-CM | POA: Diagnosis not present

## 2018-05-22 DIAGNOSIS — E669 Obesity, unspecified: Secondary | ICD-10-CM

## 2018-05-22 DIAGNOSIS — E890 Postprocedural hypothyroidism: Secondary | ICD-10-CM

## 2018-05-22 DIAGNOSIS — R7989 Other specified abnormal findings of blood chemistry: Secondary | ICD-10-CM

## 2018-05-22 DIAGNOSIS — R079 Chest pain, unspecified: Secondary | ICD-10-CM

## 2018-05-22 DIAGNOSIS — N951 Menopausal and female climacteric states: Secondary | ICD-10-CM

## 2018-05-22 DIAGNOSIS — R0789 Other chest pain: Secondary | ICD-10-CM

## 2018-05-22 DIAGNOSIS — E559 Vitamin D deficiency, unspecified: Secondary | ICD-10-CM

## 2018-05-22 DIAGNOSIS — K5904 Chronic idiopathic constipation: Secondary | ICD-10-CM

## 2018-05-22 DIAGNOSIS — R945 Abnormal results of liver function studies: Secondary | ICD-10-CM | POA: Diagnosis not present

## 2018-05-22 DIAGNOSIS — Z13220 Encounter for screening for lipoid disorders: Secondary | ICD-10-CM

## 2018-05-22 DIAGNOSIS — M2550 Pain in unspecified joint: Secondary | ICD-10-CM

## 2018-05-22 DIAGNOSIS — I10 Essential (primary) hypertension: Secondary | ICD-10-CM | POA: Diagnosis not present

## 2018-05-22 DIAGNOSIS — K5790 Diverticulosis of intestine, part unspecified, without perforation or abscess without bleeding: Secondary | ICD-10-CM

## 2018-05-22 IMAGING — DX CHEST - 2 VIEW
2 series · 2 of 2 positions shown · non-contrast
Comparison: [DATE]

CLINICAL DATA: Chest pain

EXAM:
CHEST - 2 VIEW

[chest pa]
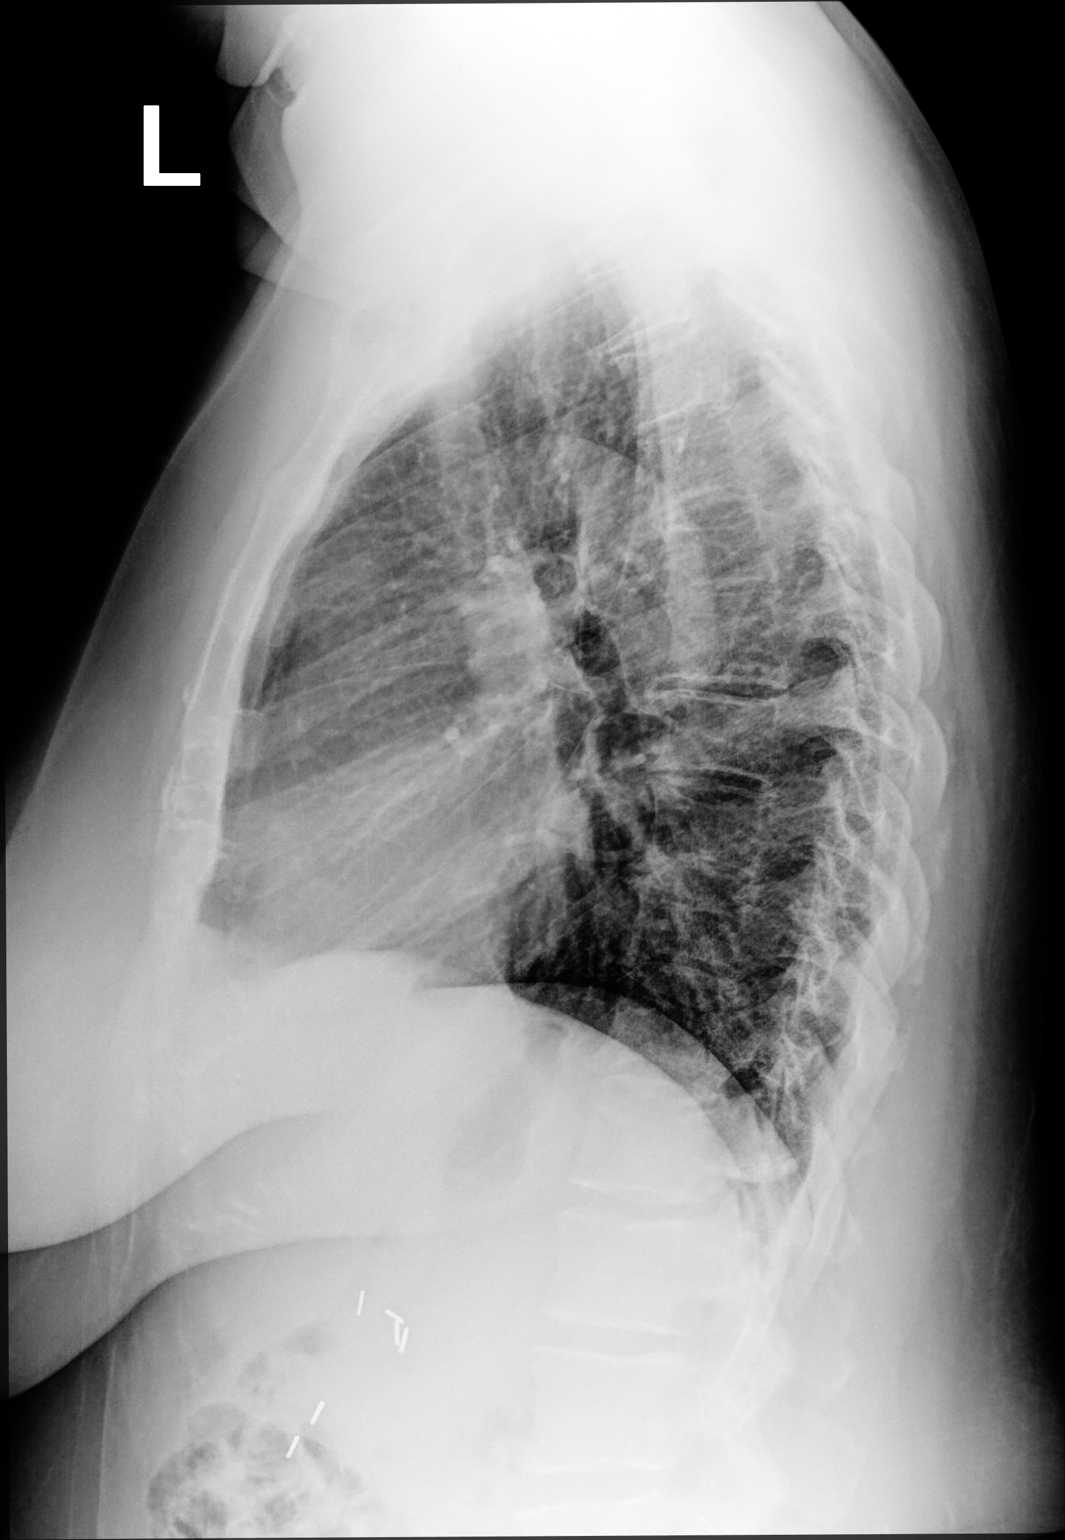

[chest lat]
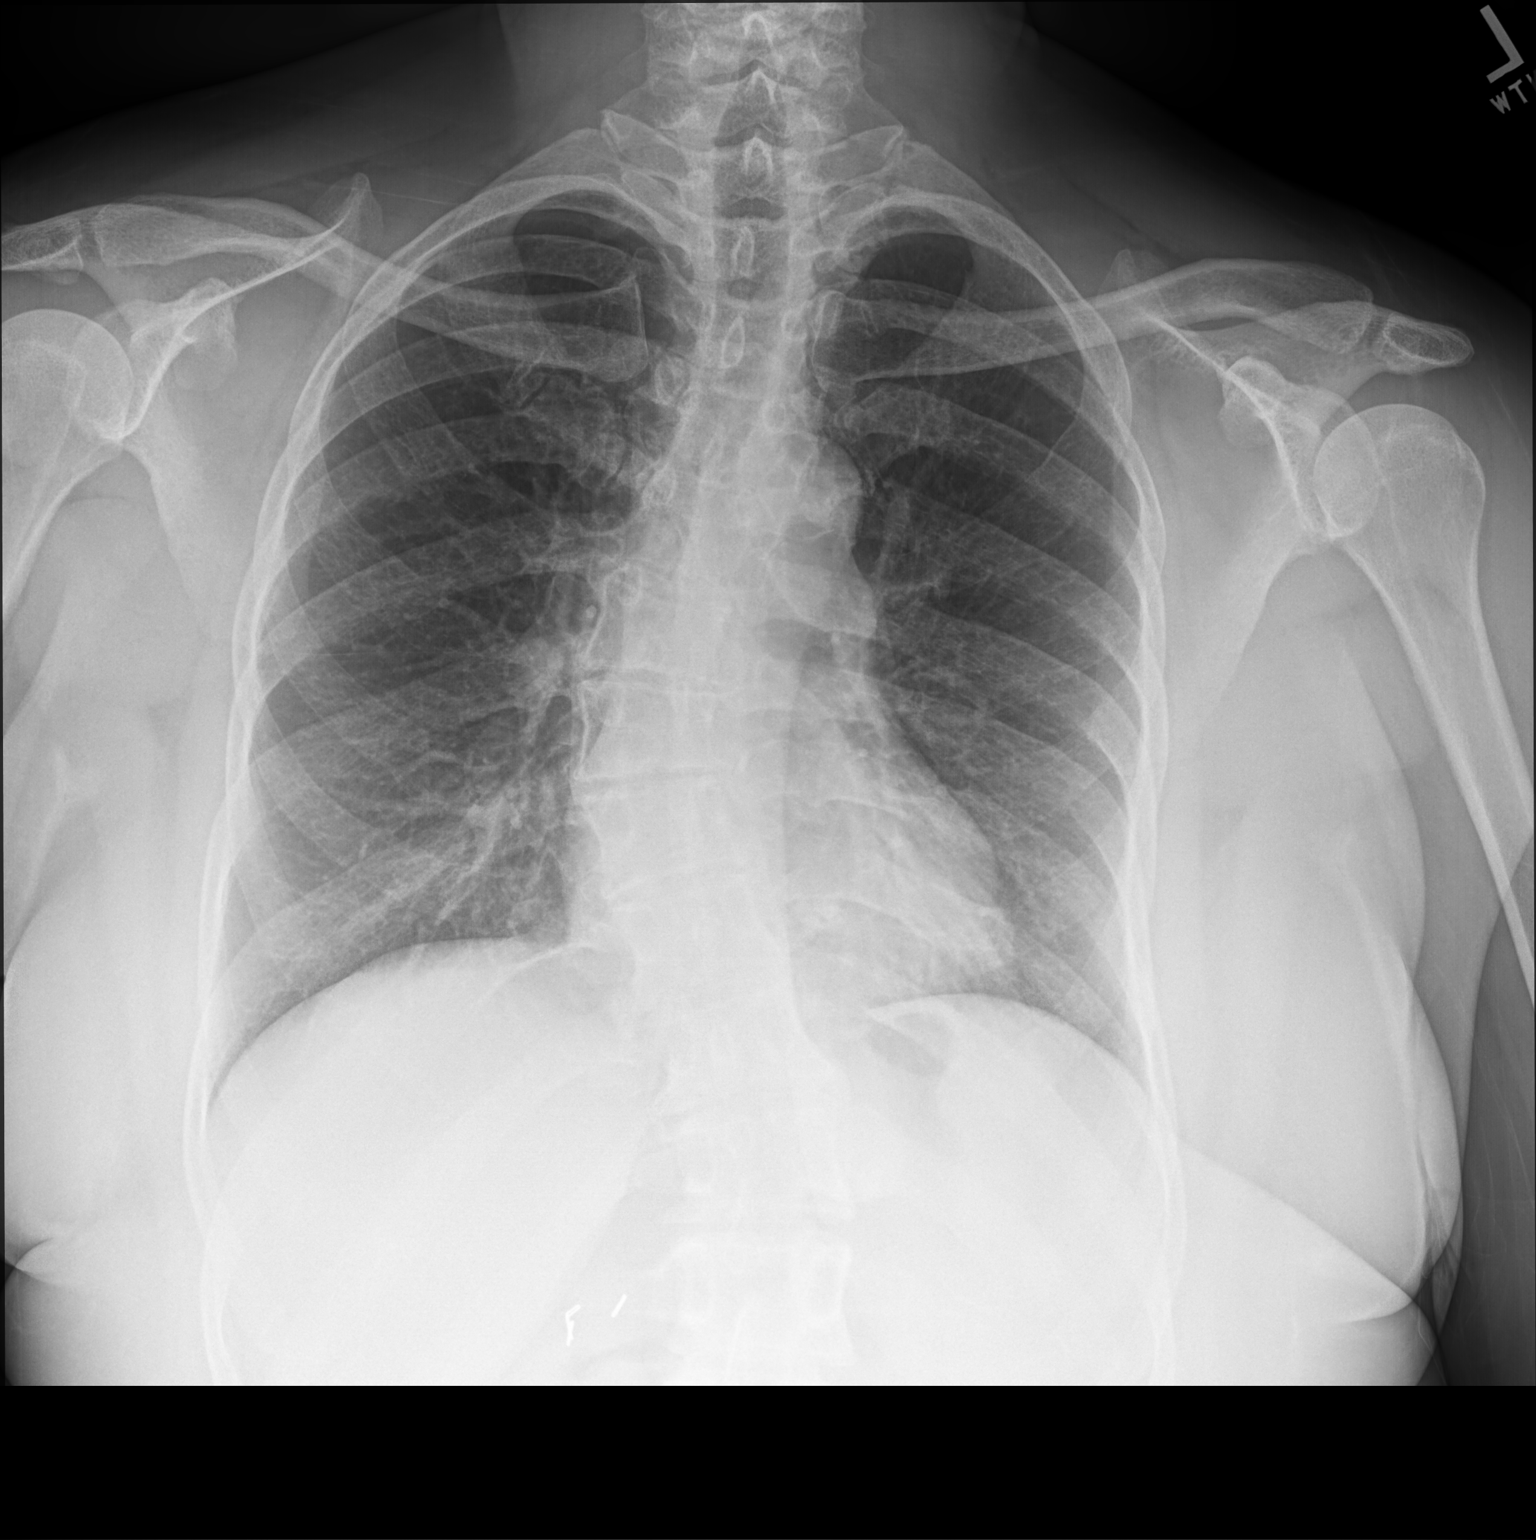

[2 of 2 positions shown; findings below may reference images not displayed]

FINDINGS: The heart size and mediastinal contours are within normal limits.
Both lungs are clear. Thoracic dextroscoliosis.
IMPRESSION: No active cardiopulmonary disease.

## 2018-05-22 MED ORDER — LEVOTHYROXINE SODIUM 88 MCG PO TABS: 88.0000 ug | ORAL_TABLET | Freq: Every day | ORAL | 3 refills | Status: DC

## 2018-05-22 MED ORDER — LOSARTAN POTASSIUM-HCTZ 100-25 MG PO TABS: 1.0000 | ORAL_TABLET | Freq: Every day | ORAL | 3 refills | Status: DC

## 2018-05-23 ENCOUNTER — Encounter: Payer: Self-pay | Admitting: Family Medicine

## 2018-05-23 DIAGNOSIS — K5904 Chronic idiopathic constipation: Secondary | ICD-10-CM | POA: Insufficient documentation

## 2018-05-23 DIAGNOSIS — R0789 Other chest pain: Secondary | ICD-10-CM | POA: Insufficient documentation

## 2018-05-23 DIAGNOSIS — K5900 Constipation, unspecified: Secondary | ICD-10-CM | POA: Insufficient documentation

## 2018-05-23 DIAGNOSIS — Z9049 Acquired absence of other specified parts of digestive tract: Secondary | ICD-10-CM | POA: Insufficient documentation

## 2018-05-23 DIAGNOSIS — K5790 Diverticulosis of intestine, part unspecified, without perforation or abscess without bleeding: Secondary | ICD-10-CM | POA: Insufficient documentation

## 2018-05-23 DIAGNOSIS — R7989 Other specified abnormal findings of blood chemistry: Secondary | ICD-10-CM | POA: Insufficient documentation

## 2018-05-23 HISTORY — DX: Other chest pain: R07.89

## 2018-05-23 HISTORY — DX: Acquired absence of other specified parts of digestive tract: Z90.49

## 2018-05-23 NOTE — Assessment & Plan Note (Signed)
Body mass index is 33.81 kg/m.  Wt Readings from Last 3 Encounters:  05/22/18 197 lb (89.4 kg)  05/08/18 199 lb 3.2 oz (90.4 kg)  02/06/18 208 lb 6.4 oz (94.5 kg)   Patient has been working hard to lose weight and is worried about gaining. We will be mindful of this while adjust Levothyroxine.

## 2018-05-23 NOTE — Assessment & Plan Note (Signed)
Review: taking medications as instructed, no medication side effects noted, noted swelling of ankles. Smoker: No.  Walks for exercise. Plant based, low-salt diet.   BP Readings from Last 3 Encounters:  05/22/18 110/62  05/08/18 125/85  02/06/18 120/80   Lab Results  Component Value Date   CREATININE 0.61 05/08/2018   CREATININE 0.74 10/02/2017     Plan: Well controlled.  No signs of complications, medication side effects, or red flags.  Continue current regimen.

## 2018-05-23 NOTE — Assessment & Plan Note (Signed)
Lab Results  Component Value Date   ALT 44 (H) 05/08/2018   AST 23 05/08/2018   ALKPHOS 73 05/08/2018   BILITOT 0.4 05/08/2018   CT abdomen 2016: Hepatobiliary: No focal hepatic lesions or intrahepatic biliary dilatation. The gallbladder is surgically absent. Mild associated common bile duct dilatation, within normal limits.  Plan: RUQ [place] to evaluate liver and common bile duct.

## 2018-05-23 NOTE — Assessment & Plan Note (Signed)
Negative Rheum panel. Working on weight loss to help with this (especially knees). Mobic has been very helpful in the past. Will restart. Can consider joint injections as needed.

## 2018-05-23 NOTE — Assessment & Plan Note (Signed)
Hx of severely low vitamin D, s/p high dose supplements. Now taking 2000 IU daily.

## 2018-05-23 NOTE — Assessment & Plan Note (Signed)
Bowel regimen includes increased fiber, Miralax, apple juice in each morning, and Linzess as needed. When she has an increased stool burden, she has increased LUQ discomfort and some increased shortness of breath.

## 2018-05-23 NOTE — Assessment & Plan Note (Signed)
Patient with consistent intermittent left-sided chest wall pain with radiating to her left shoulder. Previously improved with Mobic. Today, EKG and CXR completed. EKG: normal EKG, normal sinus rhythm. CXR with no acute process, cardiomegaly, or fluid. Vitals WNL.   Need updated lipid panel. Will include with upcoming thyroid studies.

## 2018-05-23 NOTE — Assessment & Plan Note (Addendum)
Lab Results  Component Value Date   TSH <0.01 (L) 05/08/2018   Hx of hyperthyroidism, Dx in 2018, RAI with Dr. Chalmers Cater. Controlled on Levothyroxine since that time.  Patient developed palpitations, hair loss, insomnia, anxiety, and lower extremity edema during Spring 2020. Had been working on weight loss and Rx phentermine with good results prior to that. Phentermine and patient's diet supplement containing green tea extract were discontinued but no improvement. TSH checked on 05/08/18 was <0.01. Patient had been managed by Dr. Chalmers Cater so labs faxed to her office and patient called for dosing instructions. Unfortunately, she did not hear anything for several days, so patient self-discontinued her Levothyroxine. Started to feel a little better. Dr. Chalmers Cater contacted her and Rx Levothyroxine 112 mcg daily. She has remained on that dose since that time.   Due to fear of medication interactions, patient discontinued all medications (with the exception of her HTN medications). This meant that she stopped her Effexor, Linzess, Trazodone, and Mobic. She developed worsening symptoms of hot flashes, anxiety, chest wall/left shoulder pain, lower extremity edema, irritability, and exercise intolerance. She became very frustrated that she was not getting helpful answers. A referral to a new Endocrinologist was in process but no call yet.   Patient works very hard to maintain a healthy diet (plant-based, low salt). She walks for exercise and will jog when she feels well. She does not like to take medications, preferring to maintain a healthy lifestyle.   Plan: Patient is worried about rapid weight gain if Levothyroxine decreased too quickly. Will continue current dose and recheck labs (TSH, free T4) 4-6 weeks after dose change. Future labs have already been ordered. Will discuss together after. We also discussed her request to see a new Endocrinologist. She is interested in someone local but prefers [facility].

## 2018-05-23 NOTE — Assessment & Plan Note (Signed)
On Estrogen prior to Effexor, Rx by GYN, Dr. Corinna Capra. Stopped 2/2 Dr. Almetta Lovely concern re: increased risk of cardiovascular complications. Effexor helps to control vasomotor symptoms. She stopped Effexor last week due to fear of interaction with Levothyroxine and overactive thyroid. We discussed the importance of maintaining this medication as she is likely experiencing discontinuation syndrome.

## 2018-05-24 ENCOUNTER — Encounter: Payer: Self-pay | Admitting: Family Medicine

## 2018-05-25 ENCOUNTER — Other Ambulatory Visit: Payer: Self-pay

## 2018-05-25 DIAGNOSIS — I10 Essential (primary) hypertension: Secondary | ICD-10-CM

## 2018-05-25 MED ORDER — FUROSEMIDE 20 MG PO TABS: 20.0000 mg | ORAL_TABLET | Freq: Every day | ORAL | 3 refills | Status: DC

## 2018-05-25 MED ORDER — LOSARTAN POTASSIUM-HCTZ 100-25 MG PO TABS: 1.0000 | ORAL_TABLET | Freq: Every day | ORAL | 3 refills | Status: DC

## 2018-05-25 MED ORDER — POTASSIUM CHLORIDE ER 10 MEQ PO TBCR: 10.0000 meq | EXTENDED_RELEASE_TABLET | Freq: Every day | ORAL | 0 refills | Status: DC

## 2018-05-25 MED FILL — POTASSIUM CL ER 10 MEQ TAB: 10 | 30 days supply | Qty: 30 | Fill #0

## 2018-05-25 MED FILL — LOSARTAN-HCTZ 100-25 MG TAB: 100-25 | 30 days supply | Qty: 30 | Fill #0

## 2018-05-25 MED FILL — FUROSEMIDE 20 MG TABS: 20 | 30 days supply | Qty: 30 | Fill #0

## 2018-05-29 NOTE — Telephone Encounter (Signed)
Can you please follow up on this

## 2018-06-04 ENCOUNTER — Other Ambulatory Visit: Payer: Self-pay

## 2018-06-07 ENCOUNTER — Encounter: Payer: Self-pay | Admitting: Endocrinology

## 2018-06-07 ENCOUNTER — Ambulatory Visit: Payer: 59 | Admitting: Endocrinology

## 2018-06-07 ENCOUNTER — Other Ambulatory Visit: Payer: Self-pay

## 2018-06-07 VITALS — BP 118/80 | HR 78 | Temp 97.7°F | Wt 199.8 lb

## 2018-06-07 DIAGNOSIS — E890 Postprocedural hypothyroidism: Secondary | ICD-10-CM | POA: Diagnosis not present

## 2018-06-07 LAB — T4, FREE: Free T4: 1.29 ng/dL (ref 0.60–1.60)

## 2018-06-07 LAB — T3, FREE: T3, Free: 3.1 pg/mL (ref 2.3–4.2)

## 2018-06-07 LAB — TSH: TSH: 0.21 u[IU]/mL — ABNORMAL LOW (ref 0.35–4.50)

## 2018-06-07 MED ORDER — LEVOTHYROXINE SODIUM 100 MCG PO TABS: 100.0000 ug | ORAL_TABLET | Freq: Every day | ORAL | 3 refills | Status: DC

## 2018-06-07 MED FILL — LEVOTHYROXINE 100 MCG TABLE: 100 | 30 days supply | Qty: 30 | Fill #0

## 2018-06-07 MED FILL — MELOXICAM 15 MG TABLET: 15 | 30 days supply | Qty: 30 | Fill #1

## 2018-06-07 NOTE — Progress Notes (Signed)
Subjective:    Patient ID: Diane Alexander, female    DOB: 01/22/1960, 58 y.o.   MRN: [id number]  HPI Pt is referred by Dr Juleen China, for hypothyroidism.  Pt had RAI rx for hyperthyroidism, due to [place], in 2018.  He has been on prescribed thyroid hormone therapy since soon thereafter.  she has never taken kelp or any other type of non-prescribed thyroid product.  she has never had thyroid surgery, or XRT to the neck.  she has never been on amiodarone or lithium.  she no longer takes biotin.  She has moderate palpitations in the chest, and assoc flushing.  She has been on synthroid 112 mcg/d, x 1 month.   Past Medical History:  Diagnosis Date  . Allergy   . Constipation   . Lumbar arthropathy     Past Surgical History:  Procedure Laterality Date  . BREAST CYST EXCISION    . CHOLECYSTECTOMY    . VAGINAL HYSTERECTOMY      Social History   Socioeconomic History  . Marital status: Married    Spouse name: Not on file  . Number of children: Not on file  . Years of education: Not on file  . Highest education level: Not on file  Occupational History    Employer: [employer]  Social Needs  . Financial resource strain: Not on file  . Food insecurity:    Worry: Not on file    Inability: Not on file  . Transportation needs:    Medical: Not on file    Non-medical: Not on file  Tobacco Use  . Smoking status: Never Smoker  . Smokeless tobacco: Never Used  Substance and Sexual Activity  . Alcohol use: No  . Drug use: No  . Sexual activity: Not on file  Lifestyle  . Physical activity:    Days per week: Not on file    Minutes per session: Not on file  . Stress: Not on file  Relationships  . Social connections:    Talks on phone: Not on file    Gets together: Not on file    Attends religious service: Not on file    Active member of club or organization: Not on file    Attends meetings of clubs or organizations: Not on file    Relationship status:  Not on file  . Intimate partner violence:    Fear of current or ex partner: Not on file    Emotionally abused: Not on file    Physically abused: Not on file    Forced sexual activity: Not on file  Other Topics Concern  . Not on file  Social History Narrative  . Not on file    Current Outpatient Medications on File Prior to Visit  Medication Sig Dispense Refill  . amLODipine (NORVASC) 5 MG tablet Take 1 tablet (5 mg total) by mouth daily. 90 tablet 2  . calcium carbonate (TUMS - DOSED IN MG ELEMENTAL CALCIUM) 500 MG chewable tablet Chew 1 tablet by mouth daily.    . calcium-vitamin D (OSCAL WITH D) 500-200 MG-UNIT tablet Take 1 tablet by mouth.    . cetirizine (ZYRTEC) 10 MG tablet Take 10 mg by mouth daily.    . CHOLINE PO Take 250 mg by mouth daily as needed.    Marland Kitchen estradiol (ESTRACE) 2 MG tablet estradiol 2 mg tablet    . furosemide (LASIX) 20 MG tablet Take 1 tablet (20 mg total) by mouth daily.  30 tablet 3  . Lactase (LACTAID PO) Take by mouth.    . linaclotide (LINZESS) 72 MCG capsule Take 1 capsule (72 mcg total) by mouth daily before breakfast. 30 capsule 1  . losartan-hydrochlorothiazide (HYZAAR) 100-25 MG tablet Take 1 tablet by mouth daily. 90 tablet 3  . meloxicam (MOBIC) 15 MG tablet Take 1 tablet (15 mg total) by mouth daily. 30 tablet 1  . mometasone (NASONEX) 50 MCG/ACT nasal spray 2 sprays by Nasal route daily.      . polyethylene glycol (MIRALAX / GLYCOLAX) packet Take 17 g by mouth daily.    . potassium chloride (K-DUR) 10 MEQ tablet Take 1 tablet (10 mEq total) by mouth daily. 30 tablet 0  . traZODone (DESYREL) 50 MG tablet Take 0.5-1 tablets (25-50 mg total) by mouth at bedtime as needed for sleep. 90 tablet 3  . venlafaxine XR (EFFEXOR XR) 37.5 MG 24 hr capsule Take 1 capsule (37.5 mg total) by mouth daily with breakfast. 90 capsule 3  . vitamin C (ASCORBIC ACID) 500 MG tablet Take 500 mg by mouth daily.    . Ferrous Gluconate-C-Folic Acid (IRON-C PO) Take by mouth.      No current facility-administered medications on file prior to visit.     No Known Allergies  Family History  Problem Relation Age of Onset  . Lymphoma Sister 42  . Heart disease Mother   . Heart attack Father   . Cancer Maternal Grandmother   . Thyroid disease Neg Hx     BP 118/80 (BP Location: Right Arm, Patient Position: Sitting, Cuff Size: Normal)   Pulse 78   Temp 97.7 F (36.5 C) (Oral)   Wt 199 lb 12.8 oz (90.6 kg)   SpO2 97%   BMI 34.30 kg/m    Review of Systems denies depression, sob, memory loss, diplopia, rhinorrhea, and syncope.  She has hair loss, numbness of the toes, weight gain, easy bruising, constipation, insomnia, dry skin, heat intolerance, and muscle cramps.       Objective:   Physical Exam VS: see vs page GEN: no distress HEAD: head: no deformity eyes: no periorbital swelling, no proptosis external nose and ears are normal mouth: no lesion seen NECK: supple, thyroid is not enlarged CHEST WALL: no deformity LUNGS: clear to auscultation CV: reg rate and rhythm, no murmur ABD: abdomen is soft, nontender.  no hepatosplenomegaly.  not distended.  no hernia MUSCULOSKELETAL: muscle bulk and strength are grossly normal.  no obvious joint swelling.  gait is normal and steady.   EXTEMITIES: no deformity.  no ulcer on the feet.  feet are of normal color and temp.  no edema.   PULSES: dorsalis pedis intact bilat.  no carotid bruit.   NEURO:  cn 2-12 grossly intact.   readily moves all 4's.  sensation is intact to touch on the feet.  No tremor.  SKIN:  Normal texture and temperature.  No rash or suspicious lesion is visible.   NODES:  None palpable at the neck PSYCH: alert, well-oriented.  Does not appear anxious nor depressed.     Lab Results  Component Value Date   TSH 0.21 (L) 06/07/2018   I have reviewed outside records, and summarized: Pt was noted to have abnormal TFT, and referred here.  Pt reports multiple sxs, and she was off he medication  at one point.      Assessment & Plan:  Post-TAI hypothyroidism, new to me: overreplaced.  We discussed.  She may have some endogenous  thyroid function.  I have sent a prescription to your pharmacy, to reduce the synthroid.

## 2018-06-07 NOTE — Patient Instructions (Addendum)
Blood tests are requested for you today.  We'll let you know about the results.  Based on the results, we may need to reduce the levothyroxine again.   If we have to decrease it more than today, this raises the possibility that the overactive thyroid has come.      Hypothyroidism  Hypothyroidism is when the thyroid gland does not make enough of certain hormones (it is underactive). The thyroid gland is a small gland located in the lower front part of the neck, just in front of the windpipe (trachea). This gland makes hormones that help control how the body uses food for energy (metabolism) as well as how the heart and brain function. These hormones also play a role in keeping your bones strong. When the thyroid is underactive, it produces too little of the hormones thyroxine (T4) and triiodothyronine (T3). What are the causes? This condition may be caused by:  Hashimoto's disease. This is a disease in which the body's disease-fighting system (immune system) attacks the thyroid gland. This is the most common cause.  Viral infections.  Pregnancy.  Certain medicines.  Birth defects.  Past radiation treatments to the head or neck for cancer.  Past treatment with radioactive iodine.  Past exposure to radiation in the environment.  Past surgical removal of part or all of the thyroid.  Problems with a gland in the center of the brain (pituitary gland).  Lack of enough iodine in the diet. What increases the risk? You are more likely to develop this condition if:  You are female.  You have a family history of thyroid conditions.  You use a medicine called lithium.  You take medicines that affect the immune system (immunosuppressants). What are the signs or symptoms? Symptoms of this condition include:  Feeling as though you have no energy (lethargy).  Not being able to tolerate cold.  Weight gain that is not explained by a change in diet or exercise habits.  Lack of  appetite.  Dry skin.  Coarse hair.  Menstrual irregularity.  Slowing of thought processes.  Constipation.  Sadness or depression. How is this diagnosed? This condition may be diagnosed based on:  Your symptoms, your medical history, and a physical exam.  Blood tests. You may also have imaging tests, such as an ultrasound or MRI. How is this treated? This condition is treated with medicine that replaces the thyroid hormones that your body does not make. After you begin treatment, it may take several weeks for symptoms to go away. Follow these instructions at home:  Take over-the-counter and prescription medicines only as told by your health care provider.  If you start taking any new medicines, tell your health care provider.  Keep all follow-up visits as told by your health care provider. This is important. ? As your condition improves, your dosage of thyroid hormone medicine may change. ? You will need to have blood tests regularly so that your health care provider can monitor your condition. Contact a health care provider if:  Your symptoms do not get better with treatment.  You are taking thyroid replacement medicine and you: ? Sweat a lot. ? Have tremors. ? Feel anxious. ? Lose weight rapidly. ? Cannot tolerate heat. ? Have emotional swings. ? Have diarrhea. ? Feel weak. Get help right away if you have:  Chest pain.  An irregular heartbeat.  A rapid heartbeat.  Difficulty breathing. Summary  Hypothyroidism is when the thyroid gland does not make enough of certain hormones (it is  underactive).  When the thyroid is underactive, it produces too little of the hormones thyroxine (T4) and triiodothyronine (T3).  The most common cause is Hashimoto's disease, a disease in which the body's disease-fighting system (immune system) attacks the thyroid gland. The condition can also be caused by viral infections, medicine, pregnancy, or past radiation treatment to the  head or neck.  Symptoms may include weight gain, dry skin, constipation, feeling as though you do not have energy, and not being able to tolerate cold.  This condition is treated with medicine to replace the thyroid hormones that your body does not make. This information is not intended to replace advice given to you by your health care provider. Make sure you discuss any questions you have with your health care provider. Document Released: 12/19/2004 Document Revised: 11/29/2016 Document Reviewed: 11/29/2016 Elsevier Interactive Patient Education  2019 [employer].

## 2018-06-11 NOTE — Telephone Encounter (Signed)
Will send to Dr. Ellison as FYI.  

## 2018-06-19 ENCOUNTER — Ambulatory Visit: Payer: 59 | Admitting: Family Medicine

## 2018-06-20 ENCOUNTER — Ambulatory Visit: Payer: 59 | Admitting: Family Medicine

## 2018-06-20 ENCOUNTER — Other Ambulatory Visit: Payer: Self-pay

## 2018-06-20 ENCOUNTER — Encounter: Payer: Self-pay | Admitting: Family Medicine

## 2018-06-20 VITALS — BP 116/68 | HR 82 | Temp 97.8°F | Ht 64.0 in | Wt 201.4 lb

## 2018-06-20 DIAGNOSIS — E890 Postprocedural hypothyroidism: Secondary | ICD-10-CM | POA: Diagnosis not present

## 2018-06-20 DIAGNOSIS — I10 Essential (primary) hypertension: Secondary | ICD-10-CM | POA: Diagnosis not present

## 2018-06-20 DIAGNOSIS — E669 Obesity, unspecified: Secondary | ICD-10-CM

## 2018-06-20 NOTE — Progress Notes (Signed)
Diane Alexander is a 58 y.o. female is here for follow up.  History of Present Illness:   HPI: Feeling a lot better. Exercising. Eating well. Had follow up with Dr. Loanne Drilling and really liked the visit. Felt that he explained her diagnosis and complications. No CP, palpitations. Still some mild edema of lower legs.   Depression screen [facility] 2/9 06/20/2018 02/06/2018 01/29/2017  Decreased Interest 0 0 0  Down, Depressed, Hopeless 0 0 0  PHQ - 2 Score 0 0 0  Altered sleeping 0 3 -  Tired, decreased energy 1 1 -  Change in appetite 1 1 -  Feeling bad or failure about yourself  0 0 -  Trouble concentrating 0 0 -  Moving slowly or fidgety/restless 0 0 -  Suicidal thoughts 0 0 -  PHQ-9 Score 2 5 -  Difficult doing work/chores Not difficult at all Not difficult at all -   PMHx, SurgHx, SocialHx, FamHx, Medications, and Allergies were reviewed in the Visit Navigator and updated as appropriate.   Patient Active Problem List   Diagnosis Date Noted  . Constipation, chronic idiopathic, Rx Linzess as needed 05/23/2018  . Elevated LFTs 05/23/2018  . History of cholecystectomy 05/23/2018  . Diverticulosis 05/23/2018  . Atypical chest pain 05/23/2018  . Hypothyroidism following RAI 12/2016, on Levothyroxine 05/20/2018  . Plantar fascia syndrome 02/09/2018  . OA (osteoarthritis) of knee, bilateral 10/31/2017  . Vasomotor symptoms due to menopause, Rx Effexor 10/31/2017  . Dry skin dermatitis 02/02/2017  . Obesity (BMI 30-39.9) 02/02/2017  . Adjustment insomnia 02/02/2017  . Postmenopausal estrogen deficiency 02/02/2017  . Essential hypertension, Rx Amlodipine, Losartan-HCTZ 02/02/2017  . Seasonal allergic rhinitis due to pollen 02/02/2017  . Arthralgia of multiple joints, with negative Rheum panel, controlled with Mobic 02/02/2017  . Vitamin D deficiency, taking 2000 IU daily 02/02/2017  . Lactose intolerance 02/02/2017   Social History   Tobacco Use  . Smoking status: Never Smoker  .  Smokeless tobacco: Never Used  Substance Use Topics  . Alcohol use: No  . Drug use: No   Current Medications and Allergies   Current Outpatient Medications:  .  amLODipine (NORVASC) 5 MG tablet, Take 1 tablet (5 mg total) by mouth daily., Disp: 90 tablet, Rfl: 2 .  calcium carbonate (TUMS - DOSED IN MG ELEMENTAL CALCIUM) 500 MG chewable tablet, Chew 1 tablet by mouth daily., Disp: , Rfl:  .  calcium-vitamin D (OSCAL WITH D) 500-200 MG-UNIT tablet, Take 1 tablet by mouth., Disp: , Rfl:  .  cetirizine (ZYRTEC) 10 MG tablet, Take 10 mg by mouth daily., Disp: , Rfl:  .  CHOLINE PO, Take 250 mg by mouth daily as needed., Disp: , Rfl:  .  estradiol (ESTRACE) 2 MG tablet, estradiol 2 mg tablet, Disp: , Rfl:  .  Ferrous Gluconate-C-Folic Acid (IRON-C PO), Take by mouth., Disp: , Rfl:  .  furosemide (LASIX) 20 MG tablet, Take 1 tablet (20 mg total) by mouth daily., Disp: 30 tablet, Rfl: 3 .  Lactase (LACTAID PO), Take by mouth., Disp: , Rfl:  .  levothyroxine (SYNTHROID) 100 MCG tablet, Take 1 tablet (100 mcg total) by mouth daily before breakfast., Disp: 30 tablet, Rfl: 3 .  linaclotide (LINZESS) 72 MCG capsule, Take 1 capsule (72 mcg total) by mouth daily before breakfast., Disp: 30 capsule, Rfl: 1 .  losartan-hydrochlorothiazide (HYZAAR) 100-25 MG tablet, Take 1 tablet by mouth daily., Disp: 90 tablet, Rfl: 3 .  meloxicam (MOBIC) 15 MG tablet, Take  1 tablet (15 mg total) by mouth daily., Disp: 30 tablet, Rfl: 1 .  mometasone (NASONEX) 50 MCG/ACT nasal spray, 2 sprays by Nasal route daily.  , Disp: , Rfl:  .  polyethylene glycol (MIRALAX / GLYCOLAX) packet, Take 17 g by mouth daily., Disp: , Rfl:  .  potassium chloride (K-DUR) 10 MEQ tablet, Take 1 tablet (10 mEq total) by mouth daily., Disp: 30 tablet, Rfl: 0 .  traZODone (DESYREL) 50 MG tablet, Take 0.5-1 tablets (25-50 mg total) by mouth at bedtime as needed for sleep., Disp: 90 tablet, Rfl: 3 .  venlafaxine XR (EFFEXOR XR) 37.5 MG 24 hr  capsule, Take 1 capsule (37.5 mg total) by mouth daily with breakfast., Disp: 90 capsule, Rfl: 3 .  vitamin C (ASCORBIC ACID) 500 MG tablet, Take 500 mg by mouth daily., Disp: , Rfl:   No Known Allergies   Review of Systems   Pertinent items are noted in the HPI. Otherwise, a complete ROS is negative.  Vitals   Vitals:   06/20/18 0828  BP: 116/68  Pulse: 82  Temp: 97.8 F (36.6 C)  TempSrc: Oral  SpO2: 99%  Weight: 201 lb 6.4 oz (91.4 kg)  Height: 5\' 4"  (1.626 m)     Body mass index is 34.57 kg/m.  Physical Exam   Physical Exam Vitals signs and nursing note reviewed.  HENT:     Head: Normocephalic and atraumatic.  Eyes:     Pupils: Pupils are equal, round, and reactive to light.  Neck:     Musculoskeletal: Normal range of motion and neck supple.  Cardiovascular:     Rate and Rhythm: Normal rate and regular rhythm.     Heart sounds: Normal heart sounds.  Pulmonary:     Effort: Pulmonary effort is normal.  Abdominal:     Palpations: Abdomen is soft.  Skin:    General: Skin is warm.     Comments: Mild pretibial edema.  Psychiatric:        Behavior: Behavior normal.    Results for orders placed or performed in visit on 06/07/18  T3, free  Result Value Ref Range   T3, Free 3.1 2.3 - 4.2 pg/mL  TSH  Result Value Ref Range   TSH 0.21 (L) 0.35 - 4.50 uIU/mL  T4, free  Result Value Ref Range   Free T4 1.29 0.60 - 1.60 ng/dL    Assessment and Plan   Depression screen [facility] 2/9 06/20/2018 02/06/2018 01/29/2017  Decreased Interest 0 0 0  Down, Depressed, Hopeless 0 0 0  PHQ - 2 Score 0 0 0  Altered sleeping 0 3 -  Tired, decreased energy 1 1 -  Change in appetite 1 1 -  Feeling bad or failure about yourself  0 0 -  Trouble concentrating 0 0 -  Moving slowly or fidgety/restless 0 0 -  Suicidal thoughts 0 0 -  PHQ-9 Score 2 5 -  Difficult doing work/chores Not difficult at all Not difficult at all -    Karsten was seen today for f/u leg swelling and pain in  left thumb.  Diagnoses and all orders for this visit:  Hypothyroidism following radioiodine therapy Comments: Overall feeling better. LOVES doctor Loanne Drilling. Dose decreased again last week.  Essential hypertension Comments: At goal.  Obesity (BMI 30-39.9) Comments: Will continue to work on healthy food choices and exercise as tolerated.    . Orders and follow up as documented in [place], reviewed diet, exercise and weight control, cardiovascular risk  and specific lipid/LDL goals reviewed, reviewed medications and side effects in detail.  . Reviewed expectations re: course of current medical issues. . Outlined signs and symptoms indicating need for more acute intervention. . Patient verbalized understanding and all questions were answered. . Patient received an After Visit Summary.  Briscoe Deutscher, DO [place], [facility] 06/25/2018

## 2018-06-23 ENCOUNTER — Encounter: Payer: Self-pay | Admitting: Endocrinology

## 2018-06-25 ENCOUNTER — Ambulatory Visit
Admission: RE | Admit: 2018-06-25 | Discharge: 2018-06-25 | Disposition: A | Discharge status: Home / Self Care | Payer: 59 | Source: Ambulatory Visit | Attending: Family Medicine | Admitting: Family Medicine

## 2018-06-25 DIAGNOSIS — R945 Abnormal results of liver function studies: Secondary | ICD-10-CM | POA: Diagnosis not present

## 2018-06-25 DIAGNOSIS — R7989 Other specified abnormal findings of blood chemistry: Secondary | ICD-10-CM

## 2018-06-27 ENCOUNTER — Telehealth: Payer: Self-pay | Admitting: Physical Therapy

## 2018-06-27 ENCOUNTER — Other Ambulatory Visit: Payer: Self-pay

## 2018-06-27 ENCOUNTER — Encounter: Payer: Self-pay | Admitting: Family Medicine

## 2018-06-27 DIAGNOSIS — R7989 Other specified abnormal findings of blood chemistry: Secondary | ICD-10-CM

## 2018-06-27 DIAGNOSIS — K760 Fatty (change of) liver, not elsewhere classified: Secondary | ICD-10-CM

## 2018-06-27 MED FILL — LOSARTAN-HCTZ 100-25 MG TAB: 100-25 | 30 days supply | Qty: 30 | Fill #0

## 2018-06-27 NOTE — Telephone Encounter (Signed)
Copied from [place] [phone]. Topic: Quick Communication - Other Results (Clinic Use ONLY) >> Jun 27, 2018 12:27 PM Scherrie Gerlach wrote: Pt would like a call back today and explain further to her why her [place] results could not be read clearly.  She is sort of upset about this and would like a call back to clarify these results.

## 2018-06-27 NOTE — Telephone Encounter (Signed)
Dr. Wallace, please see message and advise. 

## 2018-06-28 NOTE — Telephone Encounter (Signed)
I LM to call office back.

## 2018-06-28 NOTE — Telephone Encounter (Signed)
Will address in mychart message

## 2018-06-30 ENCOUNTER — Encounter: Payer: Self-pay | Admitting: Family Medicine

## 2018-07-01 ENCOUNTER — Telehealth: Payer: Self-pay | Admitting: Family Medicine

## 2018-07-01 ENCOUNTER — Telehealth: Payer: Self-pay

## 2018-07-01 DIAGNOSIS — K5909 Other constipation: Secondary | ICD-10-CM | POA: Diagnosis not present

## 2018-07-01 DIAGNOSIS — Z6834 Body mass index (BMI) 34.0-34.9, adult: Secondary | ICD-10-CM | POA: Diagnosis not present

## 2018-07-01 DIAGNOSIS — E669 Obesity, unspecified: Secondary | ICD-10-CM | POA: Diagnosis not present

## 2018-07-01 DIAGNOSIS — R748 Abnormal levels of other serum enzymes: Secondary | ICD-10-CM | POA: Diagnosis not present

## 2018-07-01 DIAGNOSIS — R7989 Other specified abnormal findings of blood chemistry: Secondary | ICD-10-CM | POA: Diagnosis not present

## 2018-07-01 NOTE — Telephone Encounter (Signed)
She is returning your call.  She said the message was about a referral - I did go over all the referrals with her.  She is going to call [facility] office to set up an appt.  She was pretty upset that no one called her Friday -  I did explain to her that you guys stay busy between seeing patients, phone calls, messages, etc and that it can be the next day before phone calls are returned.

## 2018-07-01 NOTE — Telephone Encounter (Signed)
Spoke to patient in regards to her concerns. She just wanted communication in her care. I reassured her that we communicate with patients in there care. Her concern was having to be told she may possibly have Cirrhosis of the liver  made her a nervous and anxious she just felt as if she should have had clear communication. I Discussed Dr. Juleen China note with the patient and reassured her that we definitely want to have clear communication with our patients.

## 2018-07-01 NOTE — Telephone Encounter (Signed)
Called to speak with patient in regards to concerns no answer left VM for patient to call office back.

## 2018-07-02 NOTE — Telephone Encounter (Signed)
Sandford Craze, RN to [facility]     07/01/18 1:12 PM Note   Spoke to patient in regards to her concerns. She just wanted communication in her care. I reassured her that we communicate with patients in there care. Her concern was having to be told she may possibly have Cirrhosis of the liver  made her a nervous and anxious she just felt as if she should have had clear communication. I Discussed Dr. Juleen China note with the patient and reassured her that we definitely want to have clear communication with our patients.

## 2018-07-03 DIAGNOSIS — R748 Abnormal levels of other serum enzymes: Secondary | ICD-10-CM | POA: Diagnosis not present

## 2018-07-12 ENCOUNTER — Other Ambulatory Visit: Payer: Self-pay | Admitting: Family Medicine

## 2018-07-12 MED ORDER — POTASSIUM CHLORIDE ER 10 MEQ PO TBCR: 10.0000 meq | EXTENDED_RELEASE_TABLET | Freq: Every day | ORAL | 2 refills | Status: DC

## 2018-07-12 MED FILL — POTASSIUM CHLORIDE CRYS ER: 10 | 30 days supply | Qty: 30 | Fill #0

## 2018-07-12 MED FILL — LEVOTHYROXINE 100 MCG TABLE: 100 | 30 days supply | Qty: 30 | Fill #1

## 2018-07-12 NOTE — Telephone Encounter (Signed)
See note

## 2018-07-12 NOTE — Telephone Encounter (Signed)
Pt called stating she is needing a refill on potassium chloride (K-DUR) 10 MEQ tablet. Please advise.     [place], [place] - [address] [facility]  [address] [address]  Phone: [phone] Fax: [phone]  Not a 24 hour pharmacy; exact hours not known.

## 2018-07-12 NOTE — Telephone Encounter (Signed)
Pt notified Rx sent to pharmacy as requested. 

## 2018-07-18 DIAGNOSIS — R7989 Other specified abnormal findings of blood chemistry: Secondary | ICD-10-CM | POA: Diagnosis not present

## 2018-07-18 DIAGNOSIS — K5900 Constipation, unspecified: Secondary | ICD-10-CM | POA: Diagnosis not present

## 2018-07-26 MED FILL — PHENTERMINE 37.5 MG TABLET: 37.5 | 30 days supply | Qty: 30 | Fill #1

## 2018-07-26 MED FILL — AMLODIPINE BESYLATE 5 MG TA: 5 | 90 days supply | Qty: 90 | Fill #0

## 2018-07-26 MED FILL — LOSARTAN-HCTZ 100-25 MG TAB: 100-25 | 30 days supply | Qty: 30 | Fill #1

## 2018-08-09 ENCOUNTER — Encounter: Payer: Self-pay | Admitting: Family Medicine

## 2018-08-09 ENCOUNTER — Other Ambulatory Visit: Payer: Self-pay

## 2018-08-09 ENCOUNTER — Ambulatory Visit: Payer: 59 | Admitting: Family Medicine

## 2018-08-09 VITALS — BP 120/68 | HR 72 | Temp 97.3°F | Resp 16 | Ht 64.0 in | Wt 200.4 lb

## 2018-08-09 DIAGNOSIS — M65312 Trigger thumb, left thumb: Secondary | ICD-10-CM | POA: Diagnosis not present

## 2018-08-09 DIAGNOSIS — E890 Postprocedural hypothyroidism: Secondary | ICD-10-CM

## 2018-08-09 LAB — TSH: TSH: 0.93 u[IU]/mL (ref 0.35–4.50)

## 2018-08-09 LAB — T3, FREE: T3, Free: 3.3 pg/mL (ref 2.3–4.2)

## 2018-08-09 LAB — T4, FREE: Free T4: 1.14 ng/dL (ref 0.60–1.60)

## 2018-08-09 NOTE — Patient Instructions (Signed)
Please follow up as scheduled for your next visit with Dr. Juleen China  If you have any questions or concerns, please don't hesitate to send me a message via MyChart or call the office at [phone]. Thank you for visiting with [place] today! It's our pleasure caring for you.   Trigger Finger  Trigger finger (stenosing tenosynovitis) is a condition that causes a finger to get stuck in a bent position. Each finger has a tough, cord-like tissue that connects muscle to bone (tendon), and each tendon is surrounded by a tunnel of tissue (tendon sheath). To move your finger, your tendon needs to slide freely through the sheath. Trigger finger happens when the tendon or the sheath thickens, making it difficult to move your finger. Trigger finger can affect any finger or a thumb. It may affect more than one finger. Mild cases may clear up with rest and medicine. Severe cases require more treatment. What are the causes? Trigger finger is caused by a thickened finger tendon or tendon sheath. The cause of this thickening is not known. What increases the risk? The following factors may make you more likely to develop this condition:  Doing activities that require a strong grip.  Having rheumatoid arthritis, gout, or diabetes.  Being 75-5 years old.  Being a woman. What are the signs or symptoms? Symptoms of this condition include:  Pain when bending or straightening your finger.  Tenderness or swelling where your finger attaches to the palm of your hand.  A lump in the palm of your hand or on the inside of your finger.  Hearing a popping sound when you try to straighten your finger.  Feeling a popping, catching, or locking sensation when you try to straighten your finger.  Being unable to straighten your finger. How is this diagnosed? This condition is diagnosed based on your symptoms and a physical exam. How is this treated? This condition may be treated by:  Resting your finger and avoiding  activities that make symptoms worse.  Wearing a finger splint to keep your finger in a slightly bent position.  Taking NSAIDs to relieve pain and swelling.  Injecting medicine (steroids) into the tendon sheath to reduce swelling and irritation. Injections may need to be repeated.  Having surgery to open the tendon sheath. This may be done if other treatments do not work and you cannot straighten your finger. You may need physical therapy after surgery. Follow these instructions at home:   Use moist heat to help reduce pain and swelling as told by your health care provider.  Rest your finger and avoid activities that make pain worse. Return to normal activities as told by your health care provider.  If you have a splint, wear it as told by your health care provider.  Take over-the-counter and prescription medicines only as told by your health care provider.  Keep all follow-up visits as told by your health care provider. This is important. Contact a health care provider if:  Your symptoms are not improving with home care. Summary  Trigger finger (stenosing tenosynovitis) causes your finger to get stuck in a bent position, and it can make it difficult and painful to straighten your finger.  This condition develops when a finger tendon or tendon sheath thickens.  Treatment starts with resting, wearing a splint, and taking NSAIDs.  In severe cases, surgery to open the tendon sheath may be needed. This information is not intended to replace advice given to you by your health care provider. Make sure  you discuss any questions you have with your health care provider. Document Released: 10/09/2003 Document Revised: 12/01/2016 Document Reviewed: 11/30/2015 Elsevier Patient Education  2020 [place] had a steroid injection today.   Things to be aware of after this injection are listed below:  You may experience no significant improvement or even a slight worsening in your  symptoms during the first 24 to 48 hours.  After that we expect your symptoms to improve gradually over the next 2 weeks for the medicine to have its maximal effect.  You should continue to have improvement out to 6 weeks after your injection.  I recommend icing the site of the injection for 20 minutes  1-2 times the day of your injection  You may shower but no swimming, tub bath or Jacuzzi for 24 hours.  If your bandage falls off this does not need to be replaced.  It is appropriate to remove the bandage after 4 hours.  You may resume light activities as tolerated.     POSSIBLE PROCEDURE SIDE EFFECTS: The side effects of the injection are usually fairly minimal however if you may experience some of the following side effects that are usually self-limited and will is off on their own.  If you are concerned please feel free to call the office with questions:             Increased numbness or tingling             Nausea or vomiting             Swelling or bruising at the injection site    Please call our office if if you experience any of the following symptoms over the next week as these can be signs of infection:              Fever greater than 100.51F             Significant swelling at the injection site             Significant redness or drainage from the injection site

## 2018-08-09 NOTE — Progress Notes (Signed)
Subjective  CC:  Chief Complaint  Patient presents with  . Thumb pain    Left side, started about 2 wks ago. Reports that she is uunable to bend thumb  . Leg Pain    Left side, on going stopped Meloxicam and is taking Tylenol    HPI: Diane Alexander is a 59 y.o. female who presents to the office today to address the problems listed above in the chief complaint.  Reports left thumb hurts and can't bend it properly. No trauma. No known overuse: typical typing and cell phone use. No redness, warmth or swelling. Has more chronic h/o left sided leg pain managed and evaluated by pcp.   Assessment  1. Trigger finger of left thumb   2. Hypothyroidism following RAI 12/2016, on Levothyroxine      Plan   Trigger finger of left thumb:  S/p steroid injection. See AVS for routine post procedure instructions. Splint x 1-2 weeks/rest. F/u if not improved.   Has labs ordered by PCP for f/u hypothyroidism. She will get them drawn today and then f/u with PCP.  Follow up: Return for as scheduled.  09/20/2018  No orders of the defined types were placed in this encounter.  No orders of the defined types were placed in this encounter.     I reviewed the patients updated PMH, FH, and SocHx.    Patient Active Problem List   Diagnosis Date Noted  . Constipation, chronic idiopathic, Rx Linzess as needed 05/23/2018  . Elevated LFTs 05/23/2018  . History of cholecystectomy 05/23/2018  . Diverticulosis 05/23/2018  . Atypical chest pain 05/23/2018  . Hypothyroidism following RAI 12/2016, on Levothyroxine 05/20/2018  . Plantar fascia syndrome 02/09/2018  . OA (osteoarthritis) of knee, bilateral 10/31/2017  . Vasomotor symptoms due to menopause, Rx Effexor 10/31/2017  . Dry skin dermatitis 02/02/2017  . Obesity (BMI 30-39.9) 02/02/2017  . Adjustment insomnia 02/02/2017  . Postmenopausal estrogen deficiency 02/02/2017  . Essential hypertension, Rx Amlodipine, Losartan-HCTZ 02/02/2017  . Seasonal  allergic rhinitis due to pollen 02/02/2017  . Arthralgia of multiple joints, with negative Rheum panel, controlled with Mobic 02/02/2017  . Vitamin D deficiency, taking 2000 IU daily 02/02/2017  . Lactose intolerance 02/02/2017   Current Meds  Medication Sig  . amLODipine (NORVASC) 5 MG tablet Take 1 tablet (5 mg total) by mouth daily.  Marland Kitchen losartan-hydrochlorothiazide (HYZAAR) 100-25 MG tablet Take 1 tablet by mouth daily.    Allergies: Patient has No Known Allergies. Family History: Patient family history includes Cancer in her maternal grandmother; Heart attack in her father; Heart disease in her mother; Lymphoma (age of onset: 76) in her sister. Social History:  Patient  reports that she has never smoked. She has never used smokeless tobacco. She reports that she does not drink alcohol or use drugs.  Review of Systems: Constitutional: Negative for fever malaise or anorexia Cardiovascular: negative for chest pain Respiratory: negative for SOB or persistent cough Gastrointestinal: negative for abdominal pain  Objective  Vitals: BP 120/68   Pulse 72   Temp (!) 97.3 F (36.3 C) (Oral)   Resp 16   Ht 5\' 4"  (1.626 m)   Wt 200 lb 6.4 oz (90.9 kg)   LMP  (LMP Unknown)   SpO2 99%   BMI 34.40 kg/m  General: no acute distress , A&Ox3 Left thumb: normal appearing. Triggers. Tender nodule at base. Skin:  Warm, no rashes Trigger Finger Steroid Injection Procedure Note  Indications: The patient has symptoms of trigger  digit that causes pain and locking.   Pre-operative Diagnosis: left 1st Trigger Finger  Post-operative Diagnosis: same   Procedure Details  A Time Out was held and the above information confirmed. The skin was prepped with alcohol and cold spray was use for anesthesia. The symptomatic trigger finger/nodule was injected with 0.68ml of Kenalog 40mg /ml and 0.37ml of 1% lidocaine. No complications.       Commons side effects, risks, benefits, and alternatives for  medications and treatment plan prescribed today were discussed, and the patient expressed understanding of the given instructions. Patient is instructed to call or message via MyChart if he/she has any questions or concerns regarding our treatment plan. No barriers to understanding were identified. We discussed Red Flag symptoms and signs in detail. Patient expressed understanding regarding what to do in case of urgent or emergency type symptoms.   Medication list was reconciled, printed and provided to the patient in AVS. Patient instructions and summary information was reviewed with the patient as documented in the AVS. This note was prepared with assistance of Dragon voice recognition software. Occasional wrong-word or sound-a-like substitutions may have occurred due to the inherent limitations of voice recognition software

## 2018-08-12 MED FILL — POTASSIUM CHLORIDE CRYS ER: 10 | 30 days supply | Qty: 30 | Fill #1

## 2018-08-12 MED FILL — LEVOTHYROXINE 100 MCG TABLE: 100 | 30 days supply | Qty: 30 | Fill #2

## 2018-08-12 MED FILL — ESTRADIOL 2 MG TABS: 2 | 30 days supply | Qty: 30 | Fill #1

## 2018-08-13 ENCOUNTER — Telehealth: Payer: Self-pay

## 2018-08-13 NOTE — Telephone Encounter (Signed)
I called patient to get more information regarding app that was scheduled for Friday that in the noet clearly states that she would like cortisone injection. Patient immediately became very aggressive as soon as I started trying to get further information regarding reason for appointment. For the entire 15 minute phone call the patient became louder and more aggressive with the conversation. I explained to her that Dr. Juleen China does not do injections. She then told me I did not know what I was talking about she had one on her finger Friday in our office. Let her know that Dr. Jonni Sanger provided that for her. Asked if she would like me to see if another provider in office would be able to see her to evaluate that may do injections for leg pain. She became even more angry with me and stating that what she wanted was for her doctor to take a look at her and tell her what she needed not her assistant.At that point let the patient know that she was becoming very disrespectful and aggressive and asked what I could do to help with her situation. At that point she states "I am no being disrespectful or aggressive and you will not put that in my chart."  She continued to digress about our office sending her to other doctors that were not needed (she had to go for evaluation of liver) and she could not afford it.  I let her know that we will look forward to seeing her on Friday and she could discuss all maters with Dr. Juleen China. Call was then ended.

## 2018-08-16 ENCOUNTER — Encounter: Payer: Self-pay | Admitting: Family Medicine

## 2018-08-16 ENCOUNTER — Ambulatory Visit: Payer: 59 | Admitting: Family Medicine

## 2018-08-16 ENCOUNTER — Other Ambulatory Visit: Payer: Self-pay

## 2018-08-16 VITALS — BP 136/82 | HR 76 | Temp 97.1°F | Ht 64.0 in | Wt 204.8 lb

## 2018-08-16 DIAGNOSIS — M5432 Sciatica, left side: Secondary | ICD-10-CM | POA: Diagnosis not present

## 2018-08-16 DIAGNOSIS — R7989 Other specified abnormal findings of blood chemistry: Secondary | ICD-10-CM

## 2018-08-16 DIAGNOSIS — R635 Abnormal weight gain: Secondary | ICD-10-CM

## 2018-08-16 DIAGNOSIS — R945 Abnormal results of liver function studies: Secondary | ICD-10-CM

## 2018-08-16 DIAGNOSIS — E890 Postprocedural hypothyroidism: Secondary | ICD-10-CM | POA: Diagnosis not present

## 2018-08-16 MED ORDER — PHENTERMINE HCL 37.5 MG PO TABS: ORAL_TABLET | ORAL | 3 refills | Status: DC

## 2018-08-16 NOTE — Progress Notes (Signed)
Diane Alexander is a 58 y.o. female is here for follow up.  History of Present Illness:   Diane Alexander, CMA acting as scribe for Dr. Briscoe Deutscher.   HPI: Patient would like to restart phentermine. She is up 4lbs from last visit. States that she walks moderate intensity for an hour daily. She has been working on reducing her carb intake following diet that her diabetic sister is on as much as she can.   Left let pain: patient has left leg pain that stars on outside of thigh down calf into ankle. Patient describes it as "tooth ache pain" that has been ongoing. She has tried ice, heat, exercises, and medication with no improvement.   There are no preventive care reminders to display for this patient. Depression screen [facility] 2/9 06/20/2018 02/06/2018 01/29/2017  Decreased Interest 0 0 0  Down, Depressed, Hopeless 0 0 0  PHQ - 2 Score 0 0 0  Altered sleeping 0 3 -  Tired, decreased energy 1 1 -  Change in appetite 1 1 -  Feeling bad or failure about yourself  0 0 -  Trouble concentrating 0 0 -  Moving slowly or fidgety/restless 0 0 -  Suicidal thoughts 0 0 -  PHQ-9 Score 2 5 -  Difficult doing work/chores Not difficult at all Not difficult at all -   PMHx, SurgHx, SocialHx, FamHx, Medications, and Allergies were reviewed in the Visit Navigator and updated as appropriate.   Patient Active Problem List   Diagnosis Date Noted  . Constipation, chronic idiopathic, Rx Linzess as needed 05/23/2018  . Elevated LFTs, worked up by GI, essentially normal, thought to be due to [facility] 05/23/2018  . History of cholecystectomy 05/23/2018  . Diverticulosis 05/23/2018  . Hypothyroidism following RAI 12/2016, on Levothyroxine 05/20/2018  . Plantar fascia syndrome 02/09/2018  . OA (osteoarthritis) of knee, bilateral 10/31/2017  . Vasomotor symptoms due to menopause, Rx Effexor 10/31/2017  . Dry skin dermatitis 02/02/2017  . Obesity (BMI 30-39.9) 02/02/2017  . Adjustment insomnia 02/02/2017  .  Postmenopausal estrogen deficiency 02/02/2017  . Essential hypertension, Rx Amlodipine, Losartan-HCTZ 02/02/2017  . Seasonal allergic rhinitis due to pollen 02/02/2017  . Arthralgia of multiple joints, with negative Rheum panel 02/02/2017  . Vitamin D deficiency, taking 2000 IU daily 02/02/2017  . Lactose intolerance 02/02/2017   Social History   Tobacco Use  . Smoking status: Never Smoker  . Smokeless tobacco: Never Used  Substance Use Topics  . Alcohol use: No  . Drug use: No   Current Medications and Allergies   .  amLODipine (NORVASC) 5 MG tablet, Take 1 tablet (5 mg total) by mouth daily., Disp: 90 tablet, Rfl: 2 .  calcium carbonate (TUMS - DOSED IN MG ELEMENTAL CALCIUM) 500 MG chewable tablet, Chew 1 tablet by mouth daily., Disp: , Rfl:  .  calcium-vitamin D (OSCAL WITH D) 500-200 MG-UNIT tablet, Take 1 tablet by mouth., Disp: , Rfl:  .  cetirizine (ZYRTEC) 10 MG tablet, Take 10 mg by mouth daily., Disp: , Rfl:  .  estradiol (ESTRACE) 2 MG tablet, estradiol 2 mg tablet, Disp: , Rfl:  .  Ferrous Gluconate-C-Folic Acid (IRON-C PO), Take by mouth., Disp: , Rfl:  .  Lactase (LACTAID PO), Take by mouth., Disp: , Rfl:  .  levothyroxine (SYNTHROID) 100 MCG tablet, Take 1 tablet (100 mcg total) by mouth daily before breakfast. .  linaclotide (LINZESS) 72 MCG capsule, Take 1 capsule (72 mcg total) by mouth daily before breakfast.  .  losartan-hydrochlorothiazide (HYZAAR) 100-25 MG tablet, Take 1 tablet by mouth daily., Disp: 90 tablet, Rfl: 3 .  mometasone (NASONEX) 50 MCG/ACT nasal spray, 2 sprays by Nasal route daily.  , Disp: , Rfl:  .  polyethylene glycol (MIRALAX / GLYCOLAX) packet, Take 17 g by mouth daily., Disp: , Rfl:  .  traZODone (DESYREL) 50 MG tablet, Take 0.5-1 tablets (25-50 mg total) by mouth at bedtime as needed for sleep. (Patient not taking: Reported on 08/09/2018), Disp: 90 tablet, Rfl: 3 .  vitamin C (ASCORBIC ACID) 500 MG tablet, Take 500 mg by mouth daily., Disp: ,  Rfl:   No Known Allergies Review of Systems   Pertinent items are noted in the HPI. Otherwise, a complete ROS is negative.  Vitals   Vitals:   08/16/18 0821  BP: 136/82  Pulse: 76  Temp: (!) 97.1 F (36.2 C)  TempSrc: Oral  SpO2: 98%  Weight: 204 lb 12.8 oz (92.9 kg)  Height: 5\' 4"  (1.626 m)     Body mass index is 35.15 kg/m.  Physical Exam   Physical Exam Vitals signs and nursing note reviewed.  HENT:     Head: Normocephalic and atraumatic.  Eyes:     Pupils: Pupils are equal, round, and reactive to light.  Neck:     Musculoskeletal: Normal range of motion and neck supple.  Cardiovascular:     Rate and Rhythm: Normal rate and regular rhythm.     Heart sounds: Normal heart sounds.  Pulmonary:     Effort: Pulmonary effort is normal.  Abdominal:     Palpations: Abdomen is soft.  Musculoskeletal:     Comments: TTP lumbar spinous process, left. + SLR.   Skin:    General: Skin is warm.  Psychiatric:        Behavior: Behavior normal.    Assessment and Plan   Toney was seen today for leg pain.  Diagnoses and all orders for this visit:  Left sided sciatica Comments: Exam consistent with left sciatica.  Patient wants to hold off on imaging due to cost.  We will have her see sports medicine and go from there. Orders: -     Ambulatory referral to Sports Medicine  Hypothyroidism following RAI 12/2016, on Levothyroxine Comments: In range now.  Followed by endocrinology.  Elevated LFTs Comments: Status post work-up and thought to be due to Mobic.  Improved at last GI visit per patient.  Weight gain Comments: Weight gain despite watching diet and exercising.  She did well on phentermine in the past.  Note that she did have chest discomfort and subsequent cardiology work-up.  This was found to be due to hyperthyroidism plus OTC diet pills on top of the phentermine.  Patient really wants to try the phentermine at a very low dose again.  We had a [place] discussion on  the appropriateness of the medication and the fact that she is not a great candidate for it with her history of hypertension.  She understands the side effects and potential complications.  Okay low-dose PRN.  If any chest pain or other side effects including elevated blood pressure, she will stop immediately and not take again.   Orders: -     phentermine (ADIPEX-P) 37.5 MG tablet; 1/2 po daily  . Orders and follow up as documented in [place], reviewed diet, exercise and weight control, cardiovascular risk and specific lipid/LDL goals reviewed, reviewed medications and side effects in detail.  . Reviewed expectations re: course of current medical issues. Marland Kitchen  Outlined signs and symptoms indicating need for more acute intervention. . Patient verbalized understanding and all questions were answered. . Patient received an After Visit Summary.  CMA served as Education administrator during this visit. History, Physical, and Plan performed by medical provider. The above documentation has been reviewed and is accurate and complete. Briscoe Deutscher, D.O.  Briscoe Deutscher, DO [place] 08/16/2018

## 2018-08-21 ENCOUNTER — Other Ambulatory Visit: Payer: Self-pay

## 2018-08-21 ENCOUNTER — Encounter: Payer: Self-pay | Admitting: Family Medicine

## 2018-08-21 ENCOUNTER — Ambulatory Visit (INDEPENDENT_AMBULATORY_CARE_PROVIDER_SITE_OTHER): Payer: 59 | Admitting: Family Medicine

## 2018-08-21 VITALS — BP 130/72 | HR 78 | Temp 97.9°F | Ht 64.0 in | Wt 203.5 lb

## 2018-08-21 DIAGNOSIS — M7632 Iliotibial band syndrome, left leg: Secondary | ICD-10-CM

## 2018-08-21 MED ORDER — DICLOFENAC SODIUM 1 % TD GEL: 2.0000 g | Freq: Four times a day (QID) | TRANSDERMAL | 1 refills | Status: AC

## 2018-08-21 MED FILL — DICLOFENAC SODIUM 1 % GEL: 1 | 12 days supply | Qty: 100 | Fill #0

## 2018-08-21 NOTE — Telephone Encounter (Signed)
Copied from [place] [id number]. Topic: General - Other >> Aug 21, 2018 10:42 AM Leward Quan A wrote: Reason for CRM: Patient called to inform Dr Juleen China that the referral made for her to see a sports medicine doctor is not available until September. Per patient she can not wait that [place] to be seen due to the nature of the pain she have been experiencing. Asking for a new referral please where she can possibly be seen in August. Please advise Ph# [phone]

## 2018-08-21 NOTE — Progress Notes (Signed)
  Subjective:     Patient ID: Diane Alexander, female   DOB: 03/02/1960, 58 y.o.   MRN: [id number]  HPI Patient is seen with pain mostly along the lateral aspect of the knee for a few weeks now.  No back pain.  She has a pain that she describes an achy quality and severe at times.  Can occur at rest and with movement such as going downstairs.  She has tried multiple things including heat, ice, icy hot, compression, and Tylenol without relief.  Denies any specific injury.  Denies any medial calf pain.  Denies any lower extremity weakness.  There is discussion of possible sciatica type symptoms.  She denies any back or thigh pain at this time.  Her pain is fairly localized over the proximal fibular region laterally  She has tried to do some increased walking recently and recently acquired a new pair of shoes.  Past Medical History:  Diagnosis Date  . Allergy   . Constipation   . Lumbar arthropathy    Past Surgical History:  Procedure Laterality Date  . BREAST CYST EXCISION    . CHOLECYSTECTOMY    . VAGINAL HYSTERECTOMY      reports that she has never smoked. She has never used smokeless tobacco. She reports that she does not drink alcohol or use drugs. family history includes Cancer in her maternal grandmother; Heart attack in her father; Heart disease in her mother; Lymphoma (age of onset: 57) in her sister. No Known Allergies   Review of Systems  Musculoskeletal: Negative for back pain.  Neurological: Negative for weakness and numbness.  Hematological: Negative for adenopathy.       Objective:   Physical Exam Constitutional:      Appearance: Normal appearance.  Cardiovascular:     Rate and Rhythm: Normal rate and regular rhythm.  Musculoskeletal:     Right lower leg: No edema.     Left lower leg: No edema.     Comments: She has some varicosities in both legs but nontender.  She has no leg edema.  Straight leg raise are negative.  She has some tenderness over the distal iliotibial  band attachment near the fibular head.  No visible erythema.  No ecchymosis.  Full range of motion of the knee.  No effusion.  No joint tenderness.  Neurological:     Mental Status: She is alert.     Comments: Full strength lower extremities        Assessment:     Left lateral knee pain.  Suspect iliotibial band syndrome    Plan:     -We recommend icing 15 to 20 minutes 2-3 times daily -Consider trial of diclofenac 1% gel 3-4 times daily -Patient has pending sports medicine referral for September and will keep that unless symptoms greatly improved  Eulas Post MD [facility]

## 2018-08-21 NOTE — Patient Instructions (Signed)
Iliotibial Band Syndrome  Iliotibial band syndrome (ITBS) is a condition that often causes knee pain. It can also cause pain in the outside of your hip, thigh, and knee. The iliotibial band is a strip of tissue that runs from the outside of your hip and down your thigh to the outside of your knee. Repeatedly bending and straightening your knee can irritate the iliotibial band. What are the causes? This condition is caused by inflammation and irritation from the friction of the iliotibial band moving over the thigh bone (femur) when you repeatedly bend and straighten your knee. What increases the risk? This condition is more likely to develop in people who:  Frequently change elevation during their workouts.  Run very [place] distances.  Recently increased the length or intensity of their workouts.  Run downhill often, or just started running downhill.  Ride a bike very far or often. You may also be at greater risk if you start a new workout routine without first warming up or if you have a job that requires you to bend, squat, or climb frequently. What are the signs or symptoms? Symptoms of this condition include:  Pain along the outside of your knee that may be worse with activity, especially running or going up and down stairs.  A "snapping" sensation over your knee.  Swelling on the outside of your knee.  Pain or a feeling of tightness in your hip. How is this diagnosed? This condition is diagnosed based on your symptoms, medical history, and physical exam. You may also see a health care provider who specializes in reducing pain and increasing mobility (physical therapist). A physical therapist may do an exam to check your balance, movement, and way of walking or running (gait) to see whether the way you move could contribute to your injury. You may also have tests to measure your strength, flexibility, and range of motion. How is this treated? Treatment for this condition includes:   Resting and limiting exercise.  Returning to activities gradually.  Doing range-of-motion and strengthening exercises (physical therapy) as told by your health care provider.  Including low-impact activities, such as swimming, in your exercise routine. Follow these instructions at home:  If directed, apply ice to the injured area. ? Put ice in a plastic bag. ? Place a towel between your skin and the bag. ? Leave the ice on for 20 minutes, 2-3 times per day.  Return to your normal activities as told by your health care provider. Ask your health care provider what activities are safe for you.  Keep all follow-up visits with your health care provider. This is important. Contact a health care provider if:  Your pain does not improve or gets worse despite treatment. This information is not intended to replace advice given to you by your health care provider. Make sure you discuss any questions you have with your health care provider. Document Released: 06/10/2001 Document Revised: 12/01/2016 Document Reviewed: 01/21/2016 Elsevier Patient Education  2020 Elsevier Inc.  

## 2018-08-22 ENCOUNTER — Other Ambulatory Visit: Payer: Self-pay

## 2018-08-22 DIAGNOSIS — M7632 Iliotibial band syndrome, left leg: Secondary | ICD-10-CM

## 2018-08-22 NOTE — Telephone Encounter (Signed)
Referral sent to [facility], Dr. Junius Roads today.

## 2018-08-22 NOTE — Telephone Encounter (Signed)
Refer to Dr. Junius Roads at [facility]

## 2018-08-27 ENCOUNTER — Ambulatory Visit (INDEPENDENT_AMBULATORY_CARE_PROVIDER_SITE_OTHER): Payer: 59 | Admitting: Family Medicine

## 2018-08-27 ENCOUNTER — Ambulatory Visit (INDEPENDENT_AMBULATORY_CARE_PROVIDER_SITE_OTHER): Payer: 59

## 2018-08-27 ENCOUNTER — Encounter: Payer: Self-pay | Admitting: Family Medicine

## 2018-08-27 DIAGNOSIS — G8929 Other chronic pain: Secondary | ICD-10-CM

## 2018-08-27 DIAGNOSIS — M79605 Pain in left leg: Secondary | ICD-10-CM

## 2018-08-27 DIAGNOSIS — M5442 Lumbago with sciatica, left side: Secondary | ICD-10-CM | POA: Diagnosis not present

## 2018-08-27 MED ORDER — GABAPENTIN 100 MG PO CAPS: ORAL_CAPSULE | ORAL | 3 refills | Status: DC

## 2018-08-27 MED FILL — GABAPENTIN 100 MG CAPSULE: 100 | 30 days supply | Qty: 90 | Fill #0

## 2018-08-27 NOTE — Progress Notes (Signed)
Diane Alexander - 58 y.o. female MRN [id number]  Date of birth: 10/19/1960  Office Visit Note: Visit Date: 08/27/2018 PCP: Briscoe Deutscher, DO Referred by: Briscoe Deutscher, DO  Subjective: Chief Complaint  Patient presents with  . Pain in left leg & in lower back   HPI: Diane Alexander is a 58 y.o. female who comes in today with worsening left leg pain over the past year. She reports that pain started gradually with no inciting injury or new activity that she is aware of. She reports pain in ankle, calf, lateral knee, lower back, and buttocks on left side. Pain hurts after walking, improved when she does a figure 4 stretch and dorsiflexes her foot. Describes pain as "tooth ache pain" and is constant, throbbing, even at rest. She has tried ice, heat, exercises with no improvement. Has not had imaging of back or knee before. She has had plantar fascitis of left foot in June 2019.  Denies numbness/tingling, change in bowel or bladder function.   ROS Otherwise per HPI.  Assessment & Plan: Visit Diagnoses:  1. Chronic left-sided low back pain with left-sided sciatica   2. Pain in left leg    58 yo female presenting with chronic worsening left lower extremity pain, with significant pain over lateral knee, calf, and ankle. She also describes significant pain in lower back and gluteus. Pain appears to be consistent with lumbar radiculopathy and narrowing of joint space at L5-S1 could bbe cause of nerve impingement. Will obtain MRI to better evaluate.   Plan:  - trial of gabapentin nightly - PT referral - MRI spine  Meds & Orders:  Meds ordered this encounter  Medications  . gabapentin (NEURONTIN) 100 MG capsule    Sig: 1 PO q HS, may increase to 3 PO q HS if needed    Dispense:  90 capsule    Refill:  3    Orders Placed This Encounter  Procedures  . XR Knee 1-2 Views Left  . XR Lumbar Spine 2-3 Views  . MR Lumbar Spine w/o contrast  . Ambulatory referral to Physical Therapy     Follow-up: No follow-ups on file.   Procedures: No procedures performed  No notes on file   Clinical History: No specialty comments available.   She reports that she has never smoked. She has never used smokeless tobacco.  Recent Labs    10/02/17 1040 05/08/18 1104  HGBA1C 5.5 5.5    Objective:  VS:  HT:    WT:   BMI:     BP:   HR: bpm  TEMP: ( )  RESP:  Physical Exam  PHYSICAL EXAM: Gen: NAD, alert, cooperative with exam, well-appearing HEENT: clear conjunctiva,  CV:  no edema, capillary refill brisk, normal rate Resp: non-labored Skin: no rashes, normal turgor  Neuro: no gross deficits.  Psych:  alert and oriented  Ortho Exam  Lumbar spine: - Inspection: no gross deformity or asymmetry, swelling or ecchymosis - Palpation: No TTP L5-S1 spinous processes, and left SI joint - ROM: full active ROM of the lumbar spine in flexion and extension. Pain with forward flexion - Strength: 5/5 strength of lower extremity in L4-S1 nerve root distributions b/l; normal gait - Neuro: sensation intact in the L4-S1 nerve root distribution b/l, 2+ L4 and S1 reflexes - Special testing: Negative straight leg raise, negative slump, positive Stork test on left, positive FABER (although pain is in calf with motion),   Knee: TTP over lateral knee at fibular  head. Negative ober's TTP over posterior calf  Imaging: Knee normal  Lumbar spine with narrowing at L5-S1  Past Medical/Family/Surgical/Social History: Medications & Allergies reviewed per EMR, new medications updated. Patient Active Problem List   Diagnosis Date Noted  . Constipation, chronic idiopathic, Rx Linzess as needed 05/23/2018  . Elevated LFTs, worked up by GI, essentially normal, thought to be due to [facility] 05/23/2018  . History of cholecystectomy 05/23/2018  . Diverticulosis 05/23/2018  . Hypothyroidism following RAI 12/2016, on Levothyroxine 05/20/2018  . Plantar fascia syndrome 02/09/2018  . OA (osteoarthritis) of  knee, bilateral 10/31/2017  . Vasomotor symptoms due to menopause, Rx Effexor 10/31/2017  . Dry skin dermatitis 02/02/2017  . Obesity (BMI 30-39.9) 02/02/2017  . Adjustment insomnia 02/02/2017  . Postmenopausal estrogen deficiency 02/02/2017  . Essential hypertension, Rx Amlodipine, Losartan-HCTZ 02/02/2017  . Seasonal allergic rhinitis due to pollen 02/02/2017  . Arthralgia of multiple joints, with negative Rheum panel 02/02/2017  . Vitamin D deficiency, taking 2000 IU daily 02/02/2017  . Lactose intolerance 02/02/2017   Past Medical History:  Diagnosis Date  . Allergy   . Constipation   . Lumbar arthropathy    Family History  Problem Relation Age of Onset  . Lymphoma Sister 34  . Heart disease Mother   . Heart attack Father   . Cancer Maternal Grandmother   . Thyroid disease Neg Hx    Past Surgical History:  Procedure Laterality Date  . BREAST CYST EXCISION    . CHOLECYSTECTOMY    . VAGINAL HYSTERECTOMY     Social History   Occupational History    Employer: [employer]  Tobacco Use  . Smoking status: Never Smoker  . Smokeless tobacco: Never Used  Substance and Sexual Activity  . Alcohol use: No  . Drug use: No  . Sexual activity: Not on file

## 2018-08-27 NOTE — Progress Notes (Signed)
I saw and examined the patient with Dr. Mayer Masker and agree with assessment and plan as outlined.  1 year of progressively worsening pain, no definite injury.  She has maximum pain on the lateral knee and posterior calf but also plantar heel pain and left posterior hip pain.  Neurological exam is nonfocal.  Suspect that she has a lumbar radiculopathy.  We will proceed with a trial of physical therapy, gabapentin, and lumbar MRI scan.

## 2018-08-30 ENCOUNTER — Encounter: Payer: Self-pay | Admitting: Family Medicine

## 2018-08-30 MED FILL — LOSARTAN-HCTZ 100-25 MG TAB: 100-25 | 30 days supply | Qty: 30 | Fill #2

## 2018-09-02 MED FILL — PHENTERMINE 37.5 MG TABLET: 37.5 | 60 days supply | Qty: 30 | Fill #0

## 2018-09-02 MED FILL — DICLOFENAC SODIUM 1 % GEL: 1 | 12 days supply | Qty: 100 | Fill #1

## 2018-09-02 MED FILL — LINZESS 72 MCG CAPSULE: 72 | 30 days supply | Qty: 30 | Fill #0

## 2018-09-11 ENCOUNTER — Encounter: Payer: Self-pay | Admitting: Physical Therapy

## 2018-09-11 ENCOUNTER — Other Ambulatory Visit: Payer: Self-pay

## 2018-09-11 ENCOUNTER — Ambulatory Visit: Payer: 59 | Attending: Family Medicine | Admitting: Physical Therapy

## 2018-09-11 DIAGNOSIS — M5442 Lumbago with sciatica, left side: Secondary | ICD-10-CM | POA: Insufficient documentation

## 2018-09-11 DIAGNOSIS — R262 Difficulty in walking, not elsewhere classified: Secondary | ICD-10-CM | POA: Insufficient documentation

## 2018-09-11 MED FILL — LEVOTHYROXINE 100 MCG TABLE: 100 | 30 days supply | Qty: 30 | Fill #3

## 2018-09-11 NOTE — Patient Instructions (Signed)
Access Code: [email or web address]  URL: [email or web address]  Date: 09/11/2018  Prepared by: Lum Babe   Exercises  Supine Piriformis Stretch Pulling Heel to Hip - 5 reps - 1 sets - 20 hold - 2x daily - 7x weekly  Standing Gastroc Stretch on Step with Counter Support - 5 reps - 1 sets - 20 hold - 2x daily - 7x weekly  Supine Hip Adductor Stretch - 5 reps - 1 sets - 20 hold - 2x daily - 7x weekly  Seated Hamstring Stretch with Chair - 5 reps - 1 sets - 30 hold - 2x daily - 7x weekly  Supine ITB Stretch with Strap - 5 reps - 1 sets - 20 hold - 2x daily - 7x weekly

## 2018-09-11 NOTE — Therapy (Addendum)
[facility] [place], [place], [address] Phone: [phone]   Fax:  [phone]  Physical Therapy Evaluation  Patient Details  Name: Diane Alexander MRN: [id number] Date of Birth: 1960-04-03 Referring Provider (PT): Hilts   Encounter Date: 09/11/2018  PT End of Session - 09/11/18 1553     Visit Number  1    Number of Visits  25    Date for PT Re-Evaluation  11/11/18    PT Start Time  1440    PT Stop Time  1546    PT Time Calculation (min)  66 min    Activity Tolerance  Patient tolerated treatment well    Behavior During Therapy  [facility] for tasks assessed/performed        Past Medical History:  Diagnosis Date   Allergy    Constipation    Lumbar arthropathy     Past Surgical History:  Procedure Laterality Date   BREAST CYST EXCISION     CHOLECYSTECTOMY     VAGINAL HYSTERECTOMY      There were no vitals filed for this visit.   Subjective Assessment - 09/11/18 1448     Subjective  Patient reports that she has had left leg pain for 2-3 years and worse over the past year.  X-rays shows DDD, slight anterolisthesis.  She is unsure of a cause of the recent increase of pain    Limitations  Walking;Standing;House hold activities    How Game can you stand comfortably?  25 minutes    Patient Stated Goals  have less pain    Currently in Pain?  Yes    Pain Score  5     Pain Location  Leg    Pain Orientation  Left;Mid;Lower    Pain Descriptors / Indicators  Aching;Burning;Cramping    Pain Type  Acute pain    Pain Radiating Towards  has pain in the left lateral leg form the buttock to the left lateral ankle, has numbness in the left foot at times    Pain Onset  More than a month ago    Pain Frequency  Constant    Aggravating Factors   standing, walking, pain up to 9/10    Pain Relieving Factors  rest, wrapping foot and pulling up with an ace bandage, pain at best a 2/10. always nagging    Effect of Pain on  Daily Activities  reports always nagging pain, has difficulty standing and walking          [facility] PT Assessment - 09/11/18 0001       Assessment   Medical Diagnosis  left leg pain    Referring Provider (PT)  Hilts    Onset Date/Surgical Date  08/11/18    Prior Therapy  no      Precautions   Precautions  None      Balance Screen   Has the patient fallen in the past 6 months  No    Has the patient had a decrease in activity level because of a fear of falling?   No    Is the patient reluctant to leave their home because of a fear of falling?   No      Home Environment   Additional Comments  has stairs,does housework and yardwork      Prior Function   Level of Independence  Independent    Vocation  Part time employment    Vocation Requirements  mostly [place]  at computer    Leisure  walking      Posture/Postural Control   Posture Comments  slouched posture      ROM / Strength   AROM / PROM / Strength  AROM;Strength      AROM   Overall AROM Comments  lumbar ROM decreased 50% for flexion and side bending with some pain, extension decreased 100%      Strength   Overall Strength Comments  left 4-/5, right 4/5      Flexibility   Soft Tissue Assessment /Muscle Length  yes   very tight calves   Hamstrings  very tight    Quadriceps  very tight    ITB  very tight    Piriformis  very tight      Palpation   Palpation comment  tender in the left buttock  the left GT and the left ITB, some tenderess in the left calf area      Ambulation/Gait   Gait Comments  has a limp on the left, goes down stairs one at a time and sideways due to pain and tightness                 Objective measurements completed on examination: See above findings.      [facility] Adult PT Treatment/Exercise - 09/11/18 0001       Modalities   Modalities  Electrical Stimulation;Moist Heat      Moist Heat Therapy   Number Minutes Moist Heat  15 Minutes    Moist Heat Location  Hip       Electrical Stimulation   Electrical Stimulation Location  left lateral hip to the distal ITB    Electrical Stimulation Action  IFC    Electrical Stimulation Parameters  supine    Electrical Stimulation Goals  Pain                PT Short Term Goals - 09/11/18 1557       PT SHORT TERM GOAL #1   Title  independent with initial HEP    Time  2    Period  Weeks    Status  New         PT [place] Term Goals - 09/11/18 1557       PT [place]son TERM GOAL #1   Title  understand posture and body mechanics    Time  8    Period  Weeks    Status  New      PT [place] TERM GOAL #2   Title  decrease pain 50%    Time  8    Period  Weeks    Status  New      PT [place] TERM GOAL #3   Title  increase ROM of the lumbar spine 25%    Time  8    Period  Weeks    Status  New      PT [place] TERM GOAL #4   Title  report able to stand a cook a meal without pain    Time  8    Period  Weeks    Status  New      PT [place] TERM GOAL #5   Title  go up and down stairs step over step    Time  8    Period  Weeks    Status  New              Plan - 09/11/18 1554  Clinical Impression Statement  Patient with left leg pain, has DDD, she is very tight in the LE's, c/o cramping type pain, has weakness of the left leg compared to the right, she has a limp on the left, she goes down stairs one at a time and sideways.  Has tenderness in the left lateral leg, buttock and the left shin and calf.    Stability/Clinical Decision Making  Stable/Uncomplicated    Clinical Decision Making  Low    Rehab Potential  Good    PT Frequency  2x / week    PT Duration  8 weeks    PT Treatment/Interventions  ADLs/Self Care Home Management;Cryotherapy;Electrical Stimulation;Iontophoresis 16m/ml Dexamethasone;Moist Heat;Traction;Ultrasound;Functional mobility training;Stair training;Therapeutic activities;Gait training;Therapeutic exercise;Neuromuscular re-education;Manual techniques;Patient/family education    PT Next  Visit Plan  slowly add exerdcises and work on flexibility    Consulted and Agree with Plan of Care  Patient        Patient will benefit from skilled therapeutic intervention in order to improve the following deficits and impairments:  Abnormal gait, Pain, Improper body mechanics, Increased fascial restricitons, Increased muscle spasms, Postural dysfunction, Decreased mobility, Decreased activity tolerance, Decreased range of motion, Decreased strength, Impaired flexibility, Difficulty walking  Visit Diagnosis: Acute left-sided low back pain with left-sided sciatica - Plan: PT plan of care cert/re-cert  Difficulty in walking, not elsewhere classified - Plan: PT plan of care cert/re-cert     Problem List Patient Active Problem List   Diagnosis Date Noted   Constipation, chronic idiopathic, Rx Linzess as needed 05/23/2018   Elevated LFTs, worked up by GI, essentially normal, thought to be due to mobic 05/23/2018   History of cholecystectomy 05/23/2018   Diverticulosis 05/23/2018   Hypothyroidism following RAI 12/2016, on Levothyroxine 05/20/2018   Plantar fascia syndrome 02/09/2018   OA (osteoarthritis) of knee, bilateral 10/31/2017   Vasomotor symptoms due to menopause, Rx Effexor 10/31/2017   Dry skin dermatitis 02/02/2017   Obesity (BMI 30-39.9) 02/02/2017   Adjustment insomnia 02/02/2017   Postmenopausal estrogen deficiency 02/02/2017   Essential hypertension, Rx Amlodipine, Losartan-HCTZ 02/02/2017   Seasonal allergic rhinitis due to pollen 02/02/2017   Arthralgia of multiple joints, with negative Rheum panel 02/02/2017   Vitamin D deficiency, taking 2000 IU daily 02/02/2017   Lactose intolerance 02/02/2017    ASumner Boast, PT 09/11/2018, 3:59 PM  [address] [place] [address]Phone: [phone]  Fax:  [phone] Name: Diane POUSTMRN: [id number] of Birth:  507-08-1960 PHYSICAL THERAPY DISCHARGE SUMMARY  Visits from Start of Care: 1  Plan: Patient agrees to discharge.  Patient goals were not met. Patient is being discharged due to not returning since eval.    LLyndee Hensen PT, DPT 3:19 PM  12/29/20

## 2018-09-18 ENCOUNTER — Ambulatory Visit: Payer: 59 | Admitting: Family Medicine

## 2018-09-20 ENCOUNTER — Ambulatory Visit: Payer: 59 | Admitting: Family Medicine

## 2018-09-25 ENCOUNTER — Ambulatory Visit: Payer: 59 | Admitting: Physical Therapy

## 2018-10-01 ENCOUNTER — Other Ambulatory Visit: Payer: 59

## 2018-10-02 ENCOUNTER — Other Ambulatory Visit: Payer: Self-pay

## 2018-10-02 DIAGNOSIS — Z20822 Contact with and (suspected) exposure to covid-19: Secondary | ICD-10-CM

## 2018-10-03 ENCOUNTER — Encounter: Payer: Self-pay | Admitting: Family Medicine

## 2018-10-03 LAB — NOVEL CORONAVIRUS, NAA: SARS-CoV-2, NAA: DETECTED — AB

## 2018-10-07 ENCOUNTER — Ambulatory Visit: Payer: Self-pay | Admitting: *Deleted

## 2018-10-07 NOTE — Telephone Encounter (Signed)
I returned her call.  I tested positive for COVID-19.   My husband is in the hospital with the COVID-19.  I have some questions about my symptoms.    Yesterday evening my thighs were cold.  I'm not coughing.   I feel dry in my nose, throat and down into my chest.     I'm using saline spray but I'm  still feeling dry in my nose and throat.  Not using anything for congestion.  Temperature right now is 98.1     I'm going to take my BP right now.   It's 108/70.  Pulse 90.   I'm feeling cold. I feel cold.  I turned the heat on in my house and I'm going to put on some extra clothes. I instructed her to check her temperature every morning and evening to make sure she is not trying to spike a fever.  "I just didn't want to get winded or anything".   "I wasn't sure what to do" since I'm cold and dry".    I encouraged her to drink more fluids and continue using the saline spray in her nose for moisture. I let her know if she starts feeling short of breath or chest discomfort/tightness to go to the ED. She is already following the isolation protocol when I asked her.   I knew from my husband being positive for the virus when I was tested and had it too. She verbalized understanding.  Reason for Disposition . Health Information question, no triage required and triager able to answer question    Had some questions about symptoms of COVID-19  Answer Assessment - Initial Assessment Questions 1. REASON FOR CALL or QUESTION: "What is your reason for calling today?" or "How can I best help you?" or "What question do you have that I can help answer?"     See documentation notes for details.  Protocols used: INFORMATION ONLY CALL-A-AH

## 2018-10-15 ENCOUNTER — Telehealth: Payer: Self-pay | Admitting: Endocrinology

## 2018-10-15 ENCOUNTER — Telehealth: Payer: Self-pay | Admitting: Family Medicine

## 2018-10-15 NOTE — Telephone Encounter (Signed)
Received notification from front office staff that pt wanted a return call from this office. Called pt and she did not answer. A voicemail was left requesting a call back to this office.

## 2018-10-15 NOTE — Telephone Encounter (Signed)
Per [facility] "Caller is needing refill on Levothyroxine 100 mcg,[facility] [address]. Completely out of medication"

## 2018-10-15 NOTE — Telephone Encounter (Signed)
pattient doesn't want to

## 2018-10-15 NOTE — Telephone Encounter (Signed)
Please advise. No mention re: when you wanted her to f/u

## 2018-10-15 NOTE — Telephone Encounter (Signed)
Options: F/u with Dr Nyra Capes, and ret here prn, or: Please come back for a follow-up appointment in 6 months

## 2018-10-15 NOTE — Telephone Encounter (Signed)
LVM requesting returned call.  Front Desk - if pt should return call, please inquire if she wishes to follow up with Dr. Loanne Drilling. Refill is pending her decision (30 day supply pending scheduling appt). Should she choose NOT to follow up her, refill request will be forwarded to pt PCP

## 2018-10-15 NOTE — Telephone Encounter (Signed)
levothyroxine (SYNTHROID) 100 MCG tablet  Is the medication she wants refilled.

## 2018-10-15 NOTE — Telephone Encounter (Signed)
Pt no longer wishes for hypothyroidism to be managed by Dr. Loanne Drilling. Per Dr. Cordelia Pen request, this refill request will be forwarded to pt PCP for management.

## 2018-10-15 NOTE — Telephone Encounter (Signed)
Will call and provide those options. In terms of the refill request, do you want to refill and if so, how much?

## 2018-10-15 NOTE — Telephone Encounter (Signed)
If pt wants to f/u here, please refill PRN If not, please forward refill request to pt's primary care provider.

## 2018-10-15 NOTE — Telephone Encounter (Signed)
Patient would like it sent to [facility], [place], [place] [address].  Please send a mychart message or call her.   Copied from [place] [phone]. Topic: Appointment Scheduling - Scheduling Inquiry for Clinic >> Oct 15, 2018  3:42 PM Rainey Pines A wrote: Patient is wanting to be seen today so that she can get a refill on a medication that Dr. Loanne Drilling has prescribed her. Patient stated that she does not wish to schedule an appointment with Dr. Rosario Adie office and wants to know if Dr. Juleen China can refill the prescription for her. Patient would like a callback as soon as possible.

## 2018-10-16 ENCOUNTER — Other Ambulatory Visit: Payer: Self-pay

## 2018-10-16 MED ORDER — LEVOTHYROXINE SODIUM 100 MCG PO TABS: 100.0000 ug | ORAL_TABLET | Freq: Every day | ORAL | 3 refills | Status: DC

## 2018-10-16 MED FILL — LEVOTHYROXINE 100 MCG TABLE: 100 | 30 days supply | Qty: 30 | Fill #0

## 2018-10-16 NOTE — Telephone Encounter (Signed)
Okay refill. 

## 2018-10-16 NOTE — Telephone Encounter (Signed)
Ok to give? Or does she need to get from endo?

## 2018-10-18 ENCOUNTER — Ambulatory Visit: Payer: Self-pay | Admitting: *Deleted

## 2018-10-18 MED ORDER — LEVOTHYROXINE SODIUM 100 MCG PO TABS: 100.0000 ug | ORAL_TABLET | Freq: Every day | ORAL | 3 refills | Status: DC

## 2018-10-18 NOTE — Telephone Encounter (Signed)
Pt called back, frustrated because she needs this Rx sent to  [place], [place]  [place] [address]  Phone: [phone] Fax: [phone]   Please advise

## 2018-10-18 NOTE — Telephone Encounter (Signed)
Medication was sent to [facility], spoke with family member & she is aware that RX has been re-submitted to [facility]

## 2018-10-18 NOTE — Addendum Note (Signed)
Addended by: Layla Barter on: 10/18/2018 01:13 PM   Modules accepted: Orders

## 2018-10-18 NOTE — Telephone Encounter (Signed)
Pt states she just got out of quarantine and her bp is 106/67 p 78 Pt wants to know if this reading is too low?   Patient reports this is the only low BP reading she has. Other BP reading are WNL for her.Patient has no symptoms. Patient is going to monitor BP and call back to report low reading, symptoms or drop of 20 mm Hg in reading.  Additional Information . Negative: [id number] Systolic BP [id number] AND [id number] taking blood pressure medications AND [3] NOT dizzy, lightheaded or weak    Patient took her BP first thing this morning and she states it was low for her. She is not having symptoms and other checks have been normal. Advised patient to continue to monitoring BP - notify office if she has drop again, has symptoms, or has drop of 20 mm Hg.  Answer Assessment - Initial Assessment Questions 1. BLOOD PRESSURE: "What is the blood pressure?" "Did you take at least two measurements 5 minutes apart?"     106/67, 123/69  P 82 2. ONSET: "When did you take your blood pressure?"     This morning, noon 3. HOW: "How did you obtain the blood pressure?" (e.g., visiting nurse, automatic home BP monitor)     Automatic cuff on the arm 4. HISTORY: "Do you have a history of low blood pressure?" "What is your blood pressure normally?"     130/120 on top number 5. MEDICATIONS: "Are you taking any medications for blood pressure?" If yes: "Have they been changed recently?"     Yes- no changes, patient has been taking elderberry  6. PULSE RATE: "Do you know what your pulse rate is?"      78 7. OTHER SYMPTOMS: "Have you been sick recently?" "Have you had a recent injury?"     No- + COVID testing and she has been released from quarantine  8. PREGNANCY: "Is there any chance you are pregnant?" "When was your last menstrual period?"     n/a  Protocols used: LOW BLOOD PRESSURE-A-AH

## 2018-10-18 NOTE — Telephone Encounter (Signed)
See below

## 2018-10-21 ENCOUNTER — Encounter: Payer: Self-pay | Admitting: Family Medicine

## 2018-10-23 ENCOUNTER — Encounter: Payer: Self-pay | Admitting: Family Medicine

## 2018-10-23 ENCOUNTER — Ambulatory Visit (INDEPENDENT_AMBULATORY_CARE_PROVIDER_SITE_OTHER): Payer: 59 | Admitting: Family Medicine

## 2018-10-23 DIAGNOSIS — J0100 Acute maxillary sinusitis, unspecified: Secondary | ICD-10-CM

## 2018-10-23 DIAGNOSIS — Z8619 Personal history of other infectious and parasitic diseases: Secondary | ICD-10-CM

## 2018-10-23 DIAGNOSIS — Z8616 Personal history of COVID-19: Secondary | ICD-10-CM

## 2018-10-23 MED ORDER — AMOXICILLIN-POT CLAVULANATE 875-125 MG PO TABS: 1.0000 | ORAL_TABLET | Freq: Two times a day (BID) | ORAL | 0 refills | Status: DC

## 2018-10-23 MED FILL — AMOX-CLAV 875-125 MG TABLET: 875-125 | 10 days supply | Qty: 20 | Fill #0

## 2018-10-23 NOTE — Progress Notes (Signed)
Virtual Visit via Video Note  Subjective  CC:  Chief Complaint  Patient presents with  . Sinusitis    Tested positive for COVID late Sept.. Reports that since she had nasal congestion on/off, facial numbness, and facial pain. Has tried Zyrtec & sinus rinses     I connected with Diane Alexander on 10/23/18 at  2:20 PM EDT by a video enabled telemedicine application and verified that I am speaking with the correct person using two identifiers. Location patient: Home Location provider: [facility] [place], Office Persons participating in the virtual visit: Diane Alexander, Leamon Arnt, MD [place], [place] discussed the limitations of evaluation and management by telemedicine and the availability of in person appointments. The patient expressed understanding and agreed to proceed. HPI: Diane Alexander is a 58 y.o. female who was contacted today to address the problems listed above in the chief complaint. . 58 yo with h/o allergies who is trying to recover from covid - 19 dxd 9/30 reports increase in left sided sinus infection sxs x 7-10 days: left sinus pressure, thick drainage/congestion, left dental pain and left headache w/o further fevers, cough or sob. Using zyrtec and flonase w/o relief. H/o sinusitis and this feels similar. No chest sxs. No gi sxs.   Assessment  1. Acute non-recurrent maxillary sinusitis   2. History of 2019 novel coronavirus disease (COVID-19)      Plan   Secondary bacterial sinusitis:  Augmentin x 10days and supportive care.  I discussed the assessment and treatment plan with the patient. The patient was provided an opportunity to ask questions and all were answered. The patient agreed with the plan and demonstrated an understanding of the instructions.   The patient was advised to call back or seek an in-person evaluation if the symptoms worsen or if the condition fails to improve as anticipated. Follow up: prn  11/22/2018  Meds  ordered this encounter  Medications  . amoxicillin-clavulanate (AUGMENTIN) 875-125 MG tablet    Sig: Take 1 tablet by mouth 2 (two) times daily.    Dispense:  20 tablet    Refill:  0      I reviewed the patients updated PMH, FH, and SocHx.    Patient Active Problem List   Diagnosis Date Noted  . Constipation, chronic idiopathic, Rx Linzess as needed 05/23/2018  . Elevated LFTs, worked up by GI, essentially normal, thought to be due to [facility] 05/23/2018  . History of cholecystectomy 05/23/2018  . Diverticulosis 05/23/2018  . Hypothyroidism following RAI 12/2016, on Levothyroxine 05/20/2018  . Plantar fascia syndrome 02/09/2018  . OA (osteoarthritis) of knee, bilateral 10/31/2017  . Vasomotor symptoms due to menopause, Rx Effexor 10/31/2017  . Dry skin dermatitis 02/02/2017  . Obesity (BMI 30-39.9) 02/02/2017  . Adjustment insomnia 02/02/2017  . Postmenopausal estrogen deficiency 02/02/2017  . Essential hypertension, Rx Amlodipine, Losartan-HCTZ 02/02/2017  . Seasonal allergic rhinitis due to pollen 02/02/2017  . Arthralgia of multiple joints, with negative Rheum panel 02/02/2017  . Vitamin D deficiency, taking 2000 IU daily 02/02/2017  . Lactose intolerance 02/02/2017   Current Meds  Medication Sig  . amLODipine (NORVASC) 5 MG tablet Take 1 tablet (5 mg total) by mouth daily.  Marland Kitchen levothyroxine (SYNTHROID) 100 MCG tablet Take 1 tablet (100 mcg total) by mouth daily.  Marland Kitchen losartan-hydrochlorothiazide (HYZAAR) 100-25 MG tablet Take 1 tablet by mouth daily.    Allergies: Patient has No Known Allergies. Family History: Patient  family history includes Cancer in her maternal grandmother; Heart attack in her father; Heart disease in her mother; Lymphoma (age of onset: 14) in her sister. Social History:  Patient  reports that she has never smoked. She has never used smokeless tobacco. She reports that she does not drink alcohol or use drugs.  Review of Systems: Constitutional:  Negative for fever malaise or anorexia Cardiovascular: negative for chest pain Respiratory: negative for SOB or persistent cough Gastrointestinal: negative for abdominal pain  OBJECTIVE Vitals: reports afebrile General: no acute distress , A&Ox3 Points to left maxillary sinus cavity when describing pain, nasal congestion present. No respiratory distress.   Leamon Arnt, MD

## 2018-10-31 ENCOUNTER — Ambulatory Visit: Payer: Self-pay | Admitting: *Deleted

## 2018-10-31 ENCOUNTER — Other Ambulatory Visit: Payer: Self-pay | Admitting: Family Medicine

## 2018-10-31 DIAGNOSIS — I10 Essential (primary) hypertension: Secondary | ICD-10-CM

## 2018-10-31 MED FILL — LOSARTAN-HCTZ 100-25 MG TAB: 100-25 | 30 days supply | Qty: 30 | Fill #3

## 2018-10-31 NOTE — Telephone Encounter (Signed)
Patient states she was seen 10/21 and diagnosed with sinus infection for these same symptoms. She has 1 day of antibiotic left and she states she has not had improvement. Patient states she is still the same. Patient thinks the symptoms are related to her COVID infection she has in September. Call to office to schedule appointment- patient was told to follow up if no better.  Reason for Disposition . [1] Numbness (i.e., loss of sensation) of the face, arm / hand, or leg / foot on one side of the body AND [2] sudden onset AND [3] brief (now gone)  Answer Assessment - Initial Assessment Questions 1. SYMPTOM: "What is the main symptom you are concerned about?" (e.g., weakness, numbness)     Numbness from ear to chin- left side and treated for sinus infection. Patient states the numbness is still present. She thinks it may be related to COVID virus 2. ONSET: "When did this start?" (minutes, hours, days; while sleeping)     Numbness started end of September 28 3. LAST NORMAL: "When was the last time you were normal (no symptoms)?"     Before COVID infection 4. PATTERN "Does this come and go, or has it been constant since it started?"  "Is it present now?"     Constant since COVID 5. CARDIAC SYMPTOMS: "Have you had any of the following symptoms: chest pain, difficulty breathing, palpitations?"     no 6. NEUROLOGIC SYMPTOMS: "Have you had any of the following symptoms: headache, dizziness, vision loss, double vision, changes in speech, unsteady on your feet?"     Some dizziness at times 7. OTHER SYMPTOMS: "Do you have any other symptoms?"     Some time feels in neck and shoulder- not all the time- comes and goes 8. PREGNANCY: "Is there any chance you are pregnant?" "When was your last menstrual period?"     n/a  Protocols used: NEUROLOGIC DEFICIT-A-AH

## 2018-10-31 NOTE — Telephone Encounter (Signed)
Diane Alexander attempted to resolve the situation with the patient by offering a virtual appointment due to the policy. Patient refused the virtual appointment and explained her frustrations to [facility]. I noticed the situation escalating and I stepped in and took over the call and introduced myself. The patient explained that she was frustrated that she had symptoms however, was told by [name] she could come into the office to be evaluated. I checked the note from [name] where it did state that the patient could come into the office to seek in person care if symptoms did not get better or worsen. I asked to put the call on hold and spoke with Diane Alexander, Practice Administrator for confirmation to see if I could ask another provider to see the patient in office. Dr. [name] was walking by and I asked him if he would see the patient in office. Dr. [name] agreed and asked for her to come in the side door due to symptoms.   Patient is scheduled for tomorrow with Dr. [name] and was given the number to call once at the side door. [phone].   Patient appreciated a chance to express her frustration. I empathized and reiterated the importance of why the policy is in place for virtual visits for such symptoms and to protect staff and other patients and the patient with symptoms. Patient stated that her frustrations are not directed at anyone and that she was just scared and unsure. I empathized and told her I was glad we were able to find a solution and our goal is to get her feeling better.    Patient stated she understood.   No further action required. Routing for documentation purposes only.

## 2018-11-01 ENCOUNTER — Encounter: Payer: Self-pay | Admitting: Family Medicine

## 2018-11-01 ENCOUNTER — Ambulatory Visit (INDEPENDENT_AMBULATORY_CARE_PROVIDER_SITE_OTHER): Payer: 59 | Admitting: Family Medicine

## 2018-11-01 VITALS — BP 118/72 | HR 90 | Temp 98.5°F | Ht 64.0 in

## 2018-11-01 DIAGNOSIS — M7632 Iliotibial band syndrome, left leg: Secondary | ICD-10-CM | POA: Diagnosis not present

## 2018-11-01 DIAGNOSIS — R200 Anesthesia of skin: Secondary | ICD-10-CM | POA: Diagnosis not present

## 2018-11-01 MED ORDER — METHYLPREDNISOLONE ACETATE 80 MG/ML IJ SUSP
80.0000 mg | Freq: Once | INTRAMUSCULAR | Status: AC
  Administered 2018-11-01: 80 mg via INTRAMUSCULAR

## 2018-11-01 NOTE — Patient Instructions (Signed)
It was very nice to see you today!  We will give you a cortisone shot today.  You can also use the diclofenac if needed.  Let me know if your symptoms are not improving by next week and we will refer you to see the neurologist.  Take care, Dr [name]  Please try these tips to maintain a healthy lifestyle:   Eat at least 3 REAL meals and 1-2 snacks per day.  Aim for no more than 5 hours between eating.  If you eat breakfast, please do so within one hour of getting up.    Obtain twice as many fruits/vegetables as protein or carbohydrate foods for both lunch and dinner. (Half of each meal should be fruits/vegetables, one quarter protein, and one quarter starchy carbs)   Cut down on sweet beverages. This includes juice, soda, and sweet tea.    Exercise at least 150 minutes every week.

## 2018-11-01 NOTE — Progress Notes (Signed)
   Chief Complaint:  Diane Alexander is a 58 y.o. female who presents today with a chief complaint of jaw numbness.   Assessment/Plan:  Jaw numbness She has numbness in her V1 distribution.  Concern for possible trigeminal neuralgia/neuropathy.  It is very possible this could be from her recent COVID-19 infection.  Offered prednisone burst however patient declined.  We will give 80 mg of IM Depo-Medrol today.  She will also use topical Voltaren to the area.  She does not want to take oral NSAIDs at this point.  Discussed reasons return to care.  Will need referral to neurology if does not improve.    Subjective:  HPI:  Jaw Numbness Patient seen 9 days ago via virtual visit for sinus congestion and facial pain.  She was diagnosed with covid about a month ago. She was started on Augmentin which did not help.  Over the last month she has had some numbness in the left side of her jaw.  This will radiate into her neck and occasionally into her eyes and sinuses.  She has not had any loss of taste or smell.  No vision changes.  No noted weakness.  She has had some general aches and pains over the last month or so that she attributes to her recent COVID-19 infection.  She was instructed to start Advil however she was reluctant to do this due to concern for possible side effects.  Symptoms worse with certain motions and eating.  No other obvious aggravating or alleviating factors.  ROS: Per HPI  PMH: She reports that she has never smoked. She has never used smokeless tobacco. She reports that she does not drink alcohol or use drugs.      Objective:  Physical Exam: BP 118/72   Pulse 90   Temp 98.5 F (36.9 C)   Ht 5\' 4"  (1.626 m)   LMP  (LMP Unknown)   SpO2 98%   BMI 34.93 kg/m   Gen: NAD, resting comfortably HEENT: Bilateral TMs clear.  Subjective numbness noted along left mandible.  TMJ palpated with moderate tenderness in left TMJ.  No clicking or popping noted. MSK: upper and lower  extremities with full range of motion. Neuro: Cranial nerves III through XII intact with the exception of above numbness.  Strength 5 out of 5 in upper and lower extremities.     Algis Greenhouse. [name], MD 11/01/2018 4:11 PM

## 2018-11-04 MED FILL — AMLODIPINE BESYLATE 5 MG TA: 5 | 90 days supply | Qty: 90 | Fill #0

## 2018-11-06 IMAGING — US ULTRASOUND ABDOMEN LIMITED
1 series · 14 of 25 positions shown · non-contrast
Comparison: CT abdomen and pelvis [DATE]

CLINICAL DATA: Elevated LFTs

EXAM:
ULTRASOUND ABDOMEN LIMITED RIGHT UPPER QUADRANT

[Series 1: ultrasound abdomen limited · 0.21mm/px · 14 of 32 slices shown]
[im 1/32]
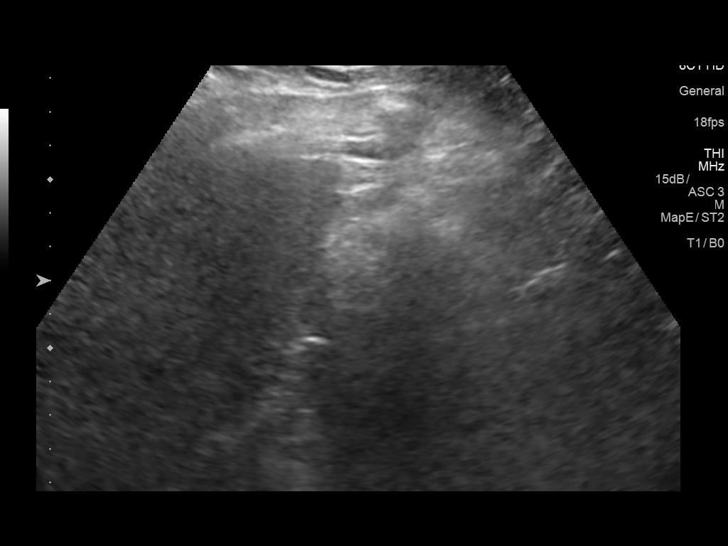
[im 3/32]
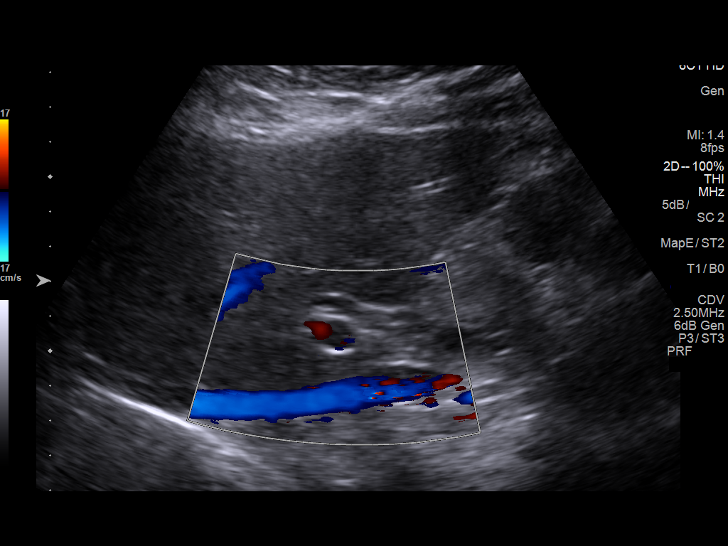
[im 6/32]
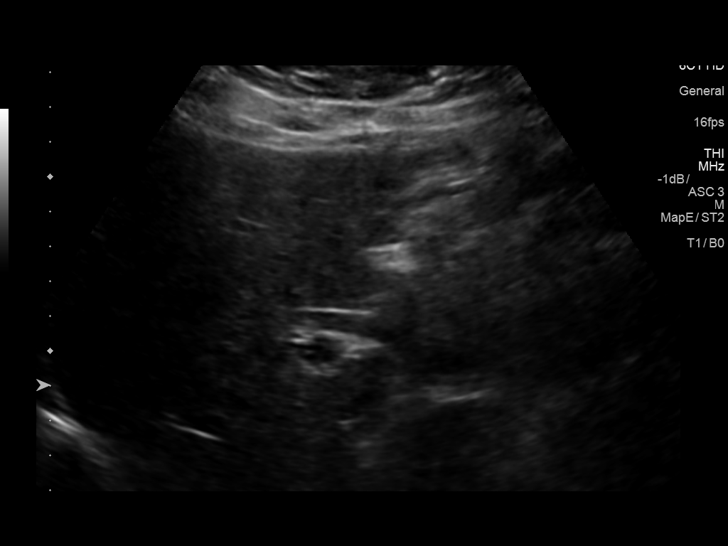
[im 8/32]
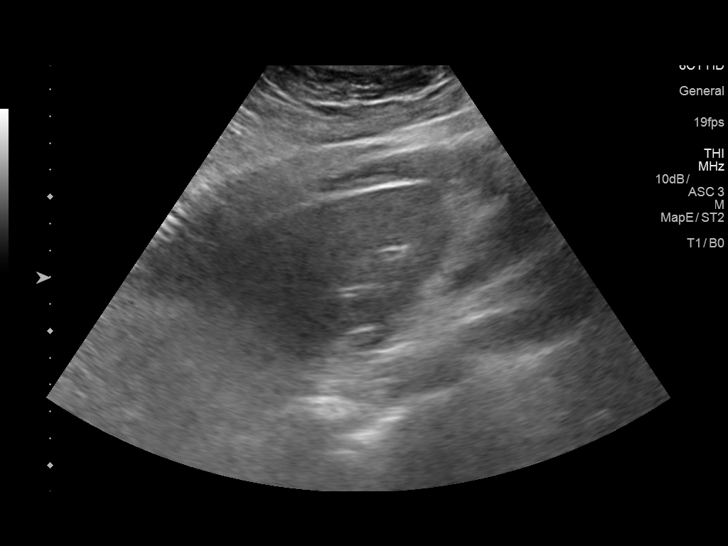
[im 11/32]
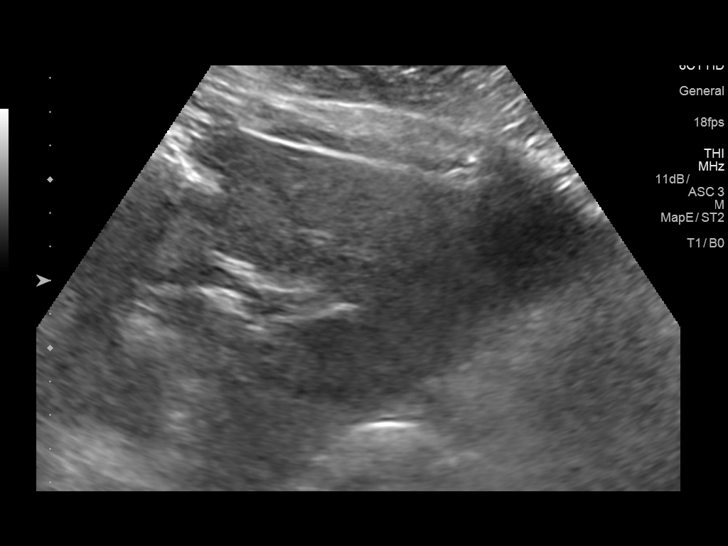
[im 12/32]
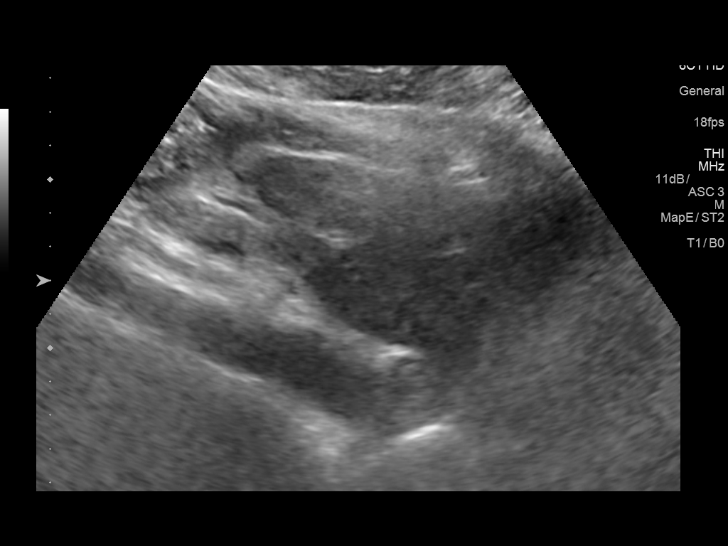
[im 15/32]
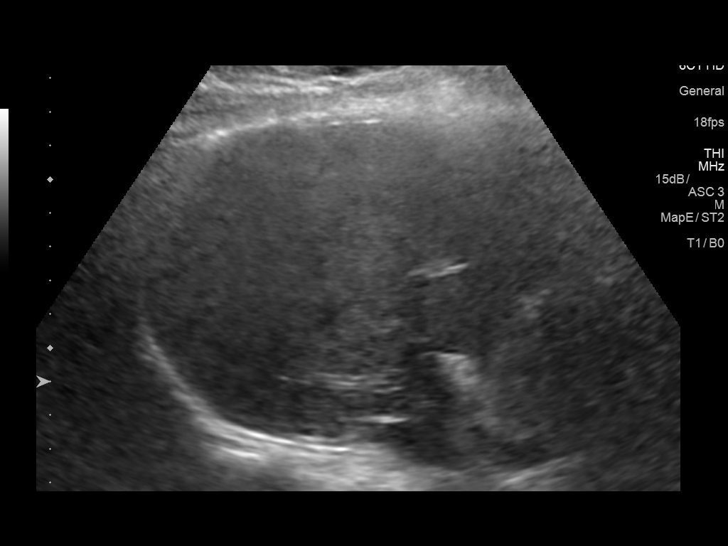
[im 17/32]
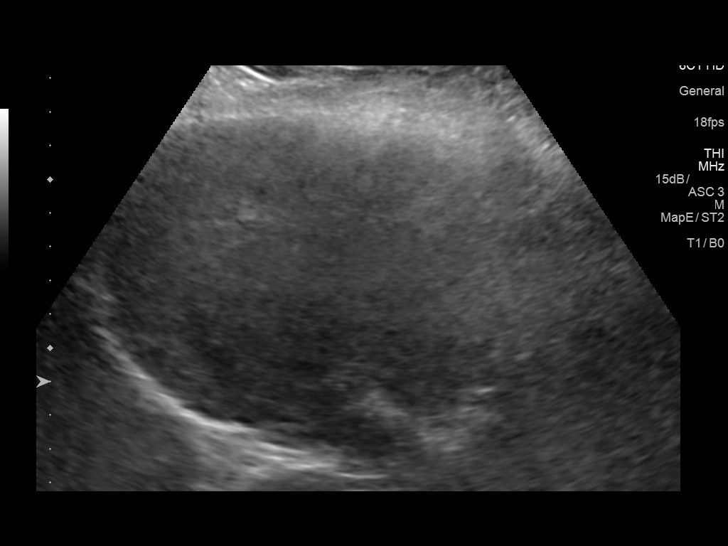
[im 20/32]
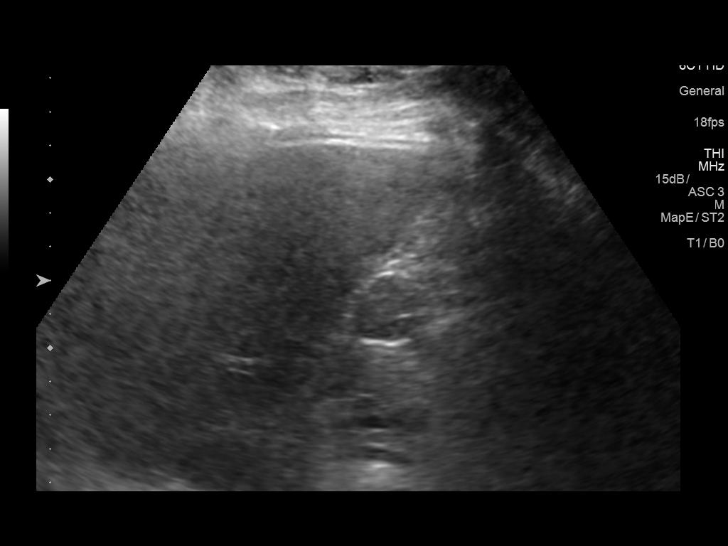
[im 21/32]
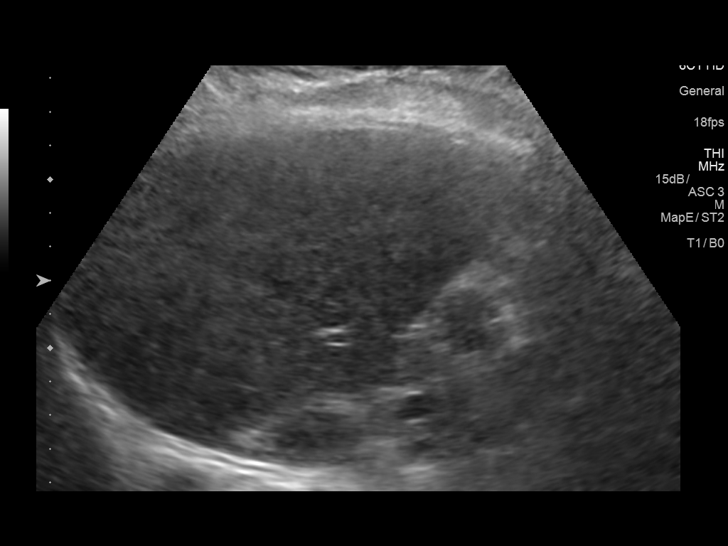
[im 24/32]
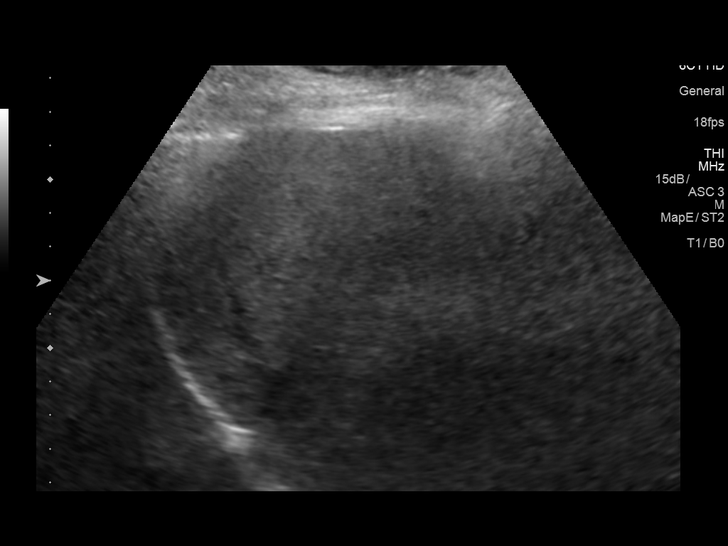
[im 26/32]
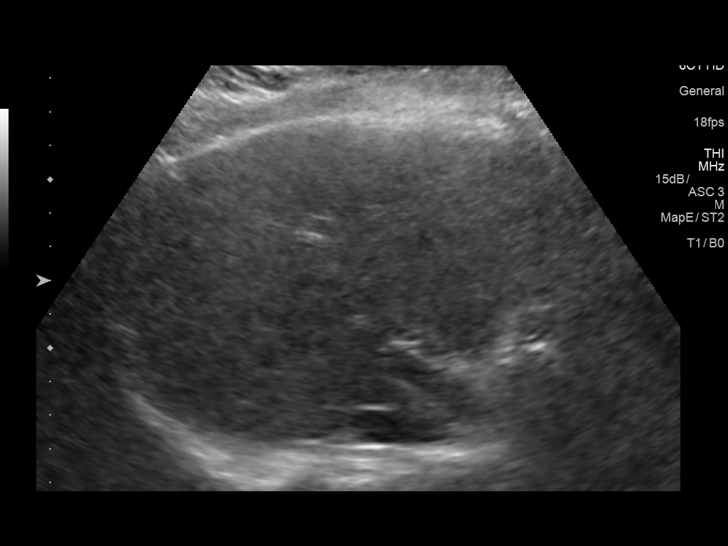
[im 29/32]
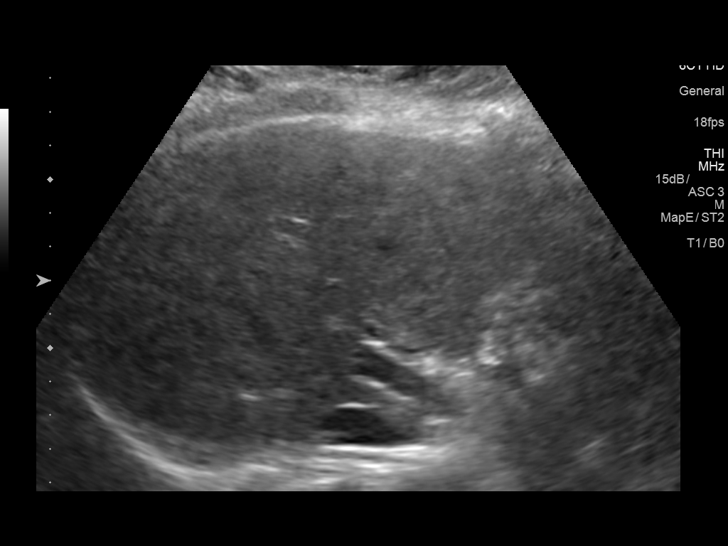
[im 32/32]
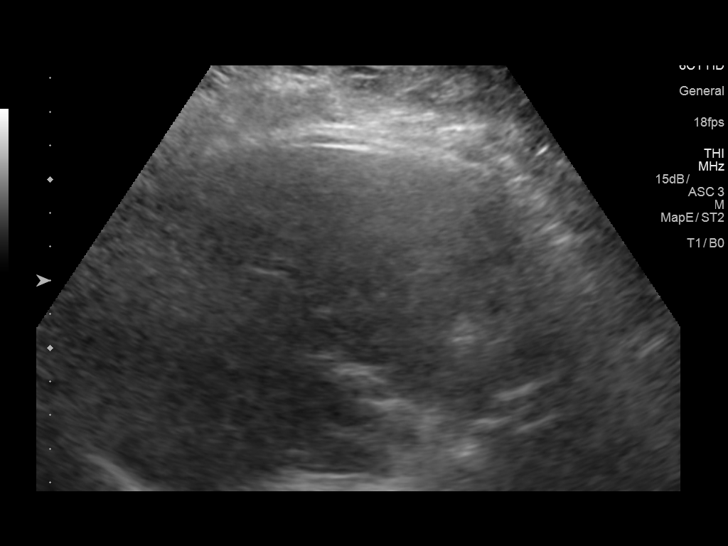

[14 of 25 positions shown; findings below may reference images not displayed]

FINDINGS: Gallbladder:

Surgically absent

Common bile duct:

Diameter: 5 mm diameter, normal

Liver:

Increased hepatic echogenicity. Suboptimal assessment of
intrahepatic detail due to sound attenuation as a result of
increased hepatic echogenicity and body habitus. Echogenic
parenchyma, likely fatty infiltration though this can be seen with
cirrhosis and certain infiltrative disorders. No definite hepatic
mass or nodularity visualized on limited assessment. Portal vein is
patent on color Doppler imaging with normal direction of blood flow
towards the liver.

No RIGHT upper quadrant free fluid.
IMPRESSION: Post cholecystectomy.

Question fatty infiltration of liver.

No definite focal hepatic abnormalities are identified though
assessment is limited due to sound attenuation; if better
intrahepatic visualization is required, consider either MR or CT
imaging.

## 2018-11-12 ENCOUNTER — Encounter: Payer: Self-pay | Admitting: Family Medicine

## 2018-11-12 NOTE — Telephone Encounter (Signed)
Please call pt and move up appt that is scheduled with Dr. Jonni Sanger. Thanks

## 2018-11-13 ENCOUNTER — Encounter: Payer: Self-pay | Admitting: Family Medicine

## 2018-11-13 ENCOUNTER — Other Ambulatory Visit: Payer: Self-pay

## 2018-11-14 ENCOUNTER — Ambulatory Visit: Payer: Self-pay | Admitting: Endocrinology

## 2018-11-14 ENCOUNTER — Other Ambulatory Visit (INDEPENDENT_AMBULATORY_CARE_PROVIDER_SITE_OTHER): Payer: Self-pay

## 2018-11-14 ENCOUNTER — Encounter: Payer: Self-pay | Admitting: Endocrinology

## 2018-11-14 VITALS — BP 118/78 | HR 88 | Ht 64.0 in | Wt 198.0 lb

## 2018-11-14 DIAGNOSIS — E890 Postprocedural hypothyroidism: Secondary | ICD-10-CM

## 2018-11-14 DIAGNOSIS — R61 Generalized hyperhidrosis: Secondary | ICD-10-CM

## 2018-11-14 LAB — TSH: TSH: 6.86 u[IU]/mL — ABNORMAL HIGH (ref 0.35–4.50)

## 2018-11-14 LAB — T4, FREE: Free T4: 1.25 ng/dL (ref 0.60–1.60)

## 2018-11-14 MED ORDER — LEVOTHYROXINE SODIUM 112 MCG PO TABS: 112.0000 ug | ORAL_TABLET | Freq: Every day | ORAL | 3 refills | Status: DC

## 2018-11-14 NOTE — Patient Instructions (Signed)
Blood tests are requested for you today.  We'll let you know about the results.  

## 2018-11-14 NOTE — Progress Notes (Signed)
Subjective:    Patient ID: Diane Alexander, female    DOB: [birth year], 59 y.o.   MRN: [id number]  HPI  Pt returns for f/u of post-RAI hypothyroidism (pt had RAI rx for hyperthyroidism, due to [place], in 2018.  He has been on prescribed thyroid hormone therapy since soon thereafter.  She has moderate diaphoresis throughout the body, and assoc heat intolerance.    Past Medical History:  Diagnosis Date  . Allergy   . Constipation   . Lumbar arthropathy     Past Surgical History:  Procedure Laterality Date  . BREAST CYST EXCISION    . CHOLECYSTECTOMY    . VAGINAL HYSTERECTOMY      Social History   Socioeconomic History  . Marital status: Married    Spouse name: Not on file  . Number of children: Not on file  . Years of education: Not on file  . Highest education level: Not on file  Occupational History    Employer: [employer]  Social Needs  . Financial resource strain: Not on file  . Food insecurity    Worry: Not on file    Inability: Not on file  . Transportation needs    Medical: Not on file    Non-medical: Not on file  Tobacco Use  . Smoking status: Never Smoker  . Smokeless tobacco: Never Used  Substance and Sexual Activity  . Alcohol use: No  . Drug use: No  . Sexual activity: Not on file  Lifestyle  . Physical activity    Days per week: Not on file    Minutes per session: Not on file  . Stress: Not on file  Relationships  . Social Herbalist on phone: Not on file    Gets together: Not on file    Attends religious service: Not on file    Active member of club or organization: Not on file    Attends meetings of clubs or organizations: Not on file    Relationship status: Not on file  . Intimate partner violence    Fear of current or ex partner: Not on file    Emotionally abused: Not on file    Physically abused: Not on file    Forced sexual activity: Not on file  Other Topics Concern  . Not on file  Social  History Narrative  . Not on file    Current Outpatient Medications on File Prior to Visit  Medication Sig Dispense Refill  . amLODipine (NORVASC) 5 MG tablet TAKE 1 TABLET (5 MG TOTAL) BY MOUTH DAILY. 90 tablet 0  . amoxicillin-clavulanate (AUGMENTIN) 875-125 MG tablet Take 1 tablet by mouth 2 (two) times daily. 20 tablet 0  . calcium carbonate (TUMS - DOSED IN MG ELEMENTAL CALCIUM) 500 MG chewable tablet Chew 1 tablet by mouth daily.    . calcium-vitamin D (OSCAL WITH D) 500-200 MG-UNIT tablet Take 1 tablet by mouth.    . cetirizine (ZYRTEC) 10 MG tablet Take 10 mg by mouth daily.    . diclofenac sodium (VOLTAREN) 1 % GEL Apply 2 g topically 4 (four) times daily. 100 g 1  . estradiol (ESTRACE) 2 MG tablet estradiol 2 mg tablet    . Ferrous Gluconate-C-Folic Acid (IRON-C PO) Take by mouth.    . furosemide (LASIX) 20 MG tablet Take 1 tablet (20 mg total) by mouth daily. 30 tablet 3  . gabapentin (NEURONTIN) 100 MG capsule 1 PO q HS, may  increase to 3 PO q HS if needed 90 capsule 3  . Lactase (LACTAID PO) Take by mouth.    . linaclotide (LINZESS) 72 MCG capsule Take 1 capsule (72 mcg total) by mouth daily before breakfast. 30 capsule 1  . losartan-hydrochlorothiazide (HYZAAR) 100-25 MG tablet Take 1 tablet by mouth daily. 90 tablet 3  . mometasone (NASONEX) 50 MCG/ACT nasal spray 2 sprays by Nasal route daily.      . phentermine (ADIPEX-P) 37.5 MG tablet 1/2 po daily 30 tablet 3  . polyethylene glycol (MIRALAX / GLYCOLAX) packet Take 17 g by mouth daily.    . potassium chloride (K-DUR) 10 MEQ tablet Take 1 tablet (10 mEq total) by mouth daily. 30 tablet 2  . vitamin C (ASCORBIC ACID) 500 MG tablet Take 500 mg by mouth daily.     No current facility-administered medications on file prior to visit.     No Known Allergies  Family History  Problem Relation Age of Onset  . Lymphoma Sister 48  . Heart disease Mother   . Heart attack Father   . Cancer Maternal Grandmother   . Thyroid  disease Neg Hx     BP 118/78 (BP Location: Left Arm, Patient Position: Sitting, Cuff Size: Large)   Pulse 88   Ht 5\' 4"  (1.626 m)   Wt 198 lb (89.8 kg)   LMP  (LMP Unknown)   SpO2 98%   BMI 33.99 kg/m   Review of Systems Denies fever and tremor.  She has palpitations    Objective:   Physical Exam VITAL SIGNS:  See vs page GENERAL: no distress NECK: There is no palpable thyroid enlargement.  No thyroid nodule is palpable.  No palpable lymphadenopathy at the anterior neck.   Lab Results  Component Value Date   TSH 6.86 (H) 11/14/2018      Assessment & Plan:  Hypothyroidism: she needs increased rx. I have sent a prescription to your pharmacy, to increase synthroid Diaphoresis, and other sxs, new.  If TFT are normal, phentermine should be considered as possible cause of sxs.   Patient Instructions  Blood tests are requested for you today.  We'll let you know about the results.

## 2018-11-15 ENCOUNTER — Ambulatory Visit: Payer: 59 | Admitting: Family Medicine

## 2018-11-18 ENCOUNTER — Encounter: Payer: Self-pay | Admitting: Family Medicine

## 2018-11-21 ENCOUNTER — Other Ambulatory Visit: Payer: Self-pay

## 2018-11-22 ENCOUNTER — Encounter: Payer: Self-pay | Admitting: Family Medicine

## 2018-11-22 ENCOUNTER — Ambulatory Visit (INDEPENDENT_AMBULATORY_CARE_PROVIDER_SITE_OTHER): Payer: Self-pay | Admitting: Family Medicine

## 2018-11-22 ENCOUNTER — Encounter: Payer: 59 | Admitting: Family Medicine

## 2018-11-22 VITALS — BP 126/84 | HR 74 | Temp 97.8°F | Ht 64.0 in | Wt 199.0 lb

## 2018-11-22 DIAGNOSIS — K5904 Chronic idiopathic constipation: Secondary | ICD-10-CM

## 2018-11-22 DIAGNOSIS — E669 Obesity, unspecified: Secondary | ICD-10-CM

## 2018-11-22 DIAGNOSIS — E890 Postprocedural hypothyroidism: Secondary | ICD-10-CM

## 2018-11-22 DIAGNOSIS — N951 Menopausal and female climacteric states: Secondary | ICD-10-CM

## 2018-11-22 DIAGNOSIS — I10 Essential (primary) hypertension: Secondary | ICD-10-CM

## 2018-11-22 DIAGNOSIS — Z79890 Hormone replacement therapy (postmenopausal): Secondary | ICD-10-CM

## 2018-11-22 NOTE — Patient Instructions (Addendum)
Please return in 6 months for your annual complete physical; please come fasting.  Please follow up with Dr. Loanne Drilling to recheck your thyroid on your increased dose of levothyroxine as he recommended.   If you have any questions or concerns, please don't hesitate to send me a message via MyChart or call the office at [phone]. Thank you for visiting with [place] today! It's our pleasure caring for you.  Please take your thyroid medication as follows: Marland Kitchen Take every morning on an empty stomach . Do not take food or drink for at least 30 minutes . Do not take with PPI's (prilosec, protonix etc), Calcium, Iron or multivitamins; these need to be taken at least 4 hours apart from the thyroid medication.

## 2018-11-22 NOTE — Progress Notes (Signed)
Subjective  CC:  Chief Complaint  Patient presents with  . Transitions Of Care  . Hypertension  . Hypothyroidism    HPI: Diane Alexander is a 58 y.o. female who presents to [facility] today to establish care with me as a new patient.  Reviewed chart.  She has the following concerns or needs:  Hypothyroidism per Dr. Loanne Drilling. Has several questions. Recent tsh was just above 6 and meds were adjusted up accordingly.   HRT per GYN, s/p hysterectomy.   Obesity on prn phentermine  Back pain; stopped gabapentin  Insomnia: prn trazadone.  Hypertension f/u: Control is good . Pt reports she is doing well. taking medications as instructed, no medication side effects noted, no TIAs, no chest pain on exertion, no dyspnea on exertion, no swelling of ankles. She denies adverse effects from his BP medications. Compliance with medication is good.   Vague historian, no new concerns.   Constipation with bloating. Eats well. On linzess prn. No abdominal pain or rectal bleeding. Crc screen is up to date  Sees GYN for female wellness  BP Readings from Last 3 Encounters:  11/22/18 126/84  11/14/18 118/78  11/01/18 118/72   Wt Readings from Last 3 Encounters:  11/22/18 199 lb (90.3 kg)  11/14/18 198 lb (89.8 kg)  08/21/18 203 lb 8 oz (92.3 kg)    No results found for: CHOL No results found for: HDL No results found for: LDLCALC No results found for: TRIG No results found for: CHOLHDL No results found for: LDLDIRECT Lab Results  Component Value Date   CREATININE 0.61 05/08/2018   BUN 11 05/08/2018   NA 139 05/08/2018   K 3.8 05/08/2018   CL 101 05/08/2018   CO2 32 05/08/2018    The ASCVD Risk score (Goff DC Jr., et al., 2013) failed to calculate for the following reasons:   Cannot find a previous HDL lab   Cannot find a previous total cholesterol lab  Assessment  1. Essential hypertension   2. Hypothyroidism following RAI 12/2016, on Levothyroxine    3. Hormone replacement therapy (HRT)   4. Chronic idiopathic constipation   5. Vasomotor symptoms due to menopause   6. Obesity (BMI 30-39.9)      Plan   htn is controlled.   Thyroid per endocrine  HRT per gyn  Continue meds as listed in chart. Updated med list; pt is unclear about what she is taking. Will bring in pill bottles to next visit.   Follow up:  No follow-ups on file. No orders of the defined types were placed in this encounter.  No orders of the defined types were placed in this encounter.    Depression screen [facility] 2/9 06/20/2018 02/06/2018 01/29/2017  Decreased Interest 0 0 0  Down, Depressed, Hopeless 0 0 0  PHQ - 2 Score 0 0 0  Altered sleeping 0 3 -  Tired, decreased energy 1 1 -  Change in appetite 1 1 -  Feeling bad or failure about yourself  0 0 -  Trouble concentrating 0 0 -  Moving slowly or fidgety/restless 0 0 -  Suicidal thoughts 0 0 -  PHQ-9 Score 2 5 -  Difficult doing work/chores Not difficult at all Not difficult at all -    We updated and reviewed the patient's past history in detail and it is documented below.  Patient Active Problem List   Diagnosis Date Noted  . Hormone replacement therapy (HRT) 11/22/2018  Priority: High  . Hypothyroidism following RAI 12/2016, on Levothyroxine 05/20/2018    Priority: High  . Essential hypertension 02/02/2017    Priority: High  . Constipation, chronic idiopathic, Rx Linzess as needed 05/23/2018    Priority: Medium  . Diverticulosis 05/23/2018    Priority: Medium  . OA (osteoarthritis) of knee, bilateral 10/31/2017    Priority: Medium  . Vasomotor symptoms due to menopause 10/31/2017    Priority: Medium  . Obesity (BMI 30-39.9) 02/02/2017    Priority: Medium  . Adjustment insomnia 02/02/2017    Priority: Medium  . Plantar fascia syndrome 02/09/2018    Priority: Low    Improving with weight loss.    . Seasonal allergic rhinitis due to pollen 02/02/2017    Priority: Low  . Arthralgia of  multiple joints, with negative Rheum panel 02/02/2017    Priority: Low  . Vitamin D deficiency, taking 2000 IU daily 02/02/2017    Priority: Low  . Lactose intolerance 02/02/2017    Priority: Low  . Elevated LFTs, worked up by GI, essentially normal, thought to be due to [facility] 05/23/2018   Health Maintenance  Topic Date Due  . MAMMOGRAM  10/09/2019  . COLONOSCOPY  07/11/2021  . TETANUS/TDAP  06/02/2026  . Hepatitis C Screening  Completed  . HIV Screening  Completed  . INFLUENZA VACCINE  Discontinued   Immunization History  Administered Date(s) Administered  . Tdap 06/01/2016   Current Meds  Medication Sig  . amLODipine (NORVASC) 5 MG tablet TAKE 1 TABLET (5 MG TOTAL) BY MOUTH DAILY.  . calcium carbonate (TUMS - DOSED IN MG ELEMENTAL CALCIUM) 500 MG chewable tablet Chew 1 tablet by mouth daily.  . calcium-vitamin D (OSCAL WITH D) 500-200 MG-UNIT tablet Take 1 tablet by mouth.  . diclofenac sodium (VOLTAREN) 1 % GEL Apply 2 g topically 4 (four) times daily.  Marland Kitchen estradiol (ESTRACE) 2 MG tablet estradiol 2 mg tablet  . gabapentin (NEURONTIN) 100 MG capsule 1 PO q HS, may increase to 3 PO q HS if needed  . Lactase (LACTAID PO) Take by mouth.  . levothyroxine (SYNTHROID) 112 MCG tablet Take 1 tablet (112 mcg total) by mouth daily before breakfast.  . linaclotide (LINZESS) 72 MCG capsule Take 1 capsule (72 mcg total) by mouth daily before breakfast.  . losartan-hydrochlorothiazide (HYZAAR) 100-25 MG tablet Take 1 tablet by mouth daily.  . phentermine (ADIPEX-P) 37.5 MG tablet 1/2 po daily  . polyethylene glycol (MIRALAX / GLYCOLAX) packet Take 17 g by mouth daily.  . [DISCONTINUED] amoxicillin-clavulanate (AUGMENTIN) 875-125 MG tablet Take 1 tablet by mouth 2 (two) times daily.  . [DISCONTINUED] cetirizine (ZYRTEC) 10 MG tablet Take 10 mg by mouth daily.  . [DISCONTINUED] Ferrous Gluconate-C-Folic Acid (IRON-C PO) Take by mouth.  . [DISCONTINUED] furosemide (LASIX) 20 MG tablet Take 1  tablet (20 mg total) by mouth daily.  . [DISCONTINUED] mometasone (NASONEX) 50 MCG/ACT nasal spray 2 sprays by Nasal route daily.    . [DISCONTINUED] potassium chloride (K-DUR) 10 MEQ tablet Take 1 tablet (10 mEq total) by mouth daily.  . [DISCONTINUED] vitamin C (ASCORBIC ACID) 500 MG tablet Take 500 mg by mouth daily.    Allergies: Patient has No Known Allergies. Past Medical History Patient  has a past medical history of Allergy, Constipation, and Lumbar arthropathy. Past Surgical History Patient  has a past surgical history that includes Vaginal hysterectomy; Cholecystectomy; and Breast cyst excision. Family History: Patient family history includes Cancer in her maternal grandmother; Heart attack in  her father; Heart disease in her mother; Lymphoma (age of onset: 11) in her sister. Social History:  Patient  reports that she has never smoked. She has never used smokeless tobacco. She reports that she does not drink alcohol or use drugs.  Review of Systems: Constitutional: negative for fever or malaise Ophthalmic: negative for photophobia, double vision or loss of vision Cardiovascular: negative for chest pain, dyspnea on exertion, or new LE swelling Respiratory: negative for SOB or persistent cough Gastrointestinal: negative for abdominal pain, change in bowel habits or melena Genitourinary: negative for dysuria or gross hematuria Musculoskeletal: negative for new gait disturbance or muscular weakness Integumentary: negative for new or persistent rashes Neurological: negative for TIA or stroke symptoms Psychiatric: negative for SI or delusions Allergic/Immunologic: negative for hives  Patient Care Team    Relationship Specialty Notifications Start End  Leamon Arnt, MD PCP - General Family Medicine  11/22/18   Lady Gary, [facility]    02/08/18   Rolene Course, PA-C Physician Assistant Internal Medicine  08/18/18   Renato Shin, MD Consulting Physician  Endocrinology  11/22/18   Louretta Shorten, MD Consulting Physician Obstetrics and Gynecology  11/22/18     Objective  Vitals: BP 126/84 (BP Location: Left Arm, Patient Position: Sitting, Cuff Size: Normal)   Pulse 74   Temp 97.8 F (36.6 C) (Temporal)   Ht 5\' 4"  (1.626 m)   Wt 199 lb (90.3 kg)   LMP  (LMP Unknown)   SpO2 96%   BMI 34.16 kg/m  General:  Well developed, well nourished, no acute distress  Psych:  Alert and oriented,normal mood and affect HEENT:  Normocephalic, atraumatic, non-icteric sclera, PERRL, oropharynx is without mass or exudate, supple neck without adenopathy, mass or thyromegaly Cardiovascular:  RRR without gallop, rub or murmur, nondisplaced PMI Respiratory:  Good breath sounds bilaterally, CTAB with normal respiratory effort Gastrointestinal: normal bowel sounds, soft, non-tender, no noted masses. No HSM MSK: no deformities, contusions. Joints are without erythema or swelling Skin:  Warm, no rashes or suspicious lesions noted Neurologic:    Mental status is normal. Gross motor and sensory exams are normal. Normal gait   Commons side effects, risks, benefits, and alternatives for medications and treatment plan prescribed today were discussed, and the patient expressed understanding of the given instructions. Patient is instructed to call or message via MyChart if he/she has any questions or concerns regarding our treatment plan. No barriers to understanding were identified. We discussed Red Flag symptoms and signs in detail. Patient expressed understanding regarding what to do in case of urgent or emergency type symptoms.   Medication list was reconciled, printed and provided to the patient in AVS. Patient instructions and summary information was reviewed with the patient as documented in the AVS. This note was prepared with assistance of Dragon voice recognition software. Occasional wrong-word or sound-a-like substitutions may have occurred due to the inherent  limitations of voice recognition software  This visit occurred during the SARS-CoV-2 public health emergency.  Safety protocols were in place, including screening questions prior to the visit, additional usage of staff PPE, and extensive cleaning of exam room while observing appropriate contact time as indicated for disinfecting solutions.

## 2018-12-06 ENCOUNTER — Ambulatory Visit (INDEPENDENT_AMBULATORY_CARE_PROVIDER_SITE_OTHER): Payer: Self-pay | Admitting: Family Medicine

## 2018-12-06 ENCOUNTER — Encounter: Payer: Self-pay | Admitting: Family Medicine

## 2018-12-06 ENCOUNTER — Ambulatory Visit: Payer: Self-pay | Admitting: *Deleted

## 2018-12-06 VITALS — BP 138/86 | HR 81 | Temp 98.2°F | Ht 64.0 in | Wt 202.2 lb

## 2018-12-06 DIAGNOSIS — R079 Chest pain, unspecified: Secondary | ICD-10-CM

## 2018-12-06 MED ORDER — PANTOPRAZOLE SODIUM 40 MG PO TBEC: 40.0000 mg | DELAYED_RELEASE_TABLET | Freq: Every day | ORAL | 0 refills | Status: DC

## 2018-12-06 MED ORDER — DICLOFENAC SODIUM 75 MG PO TBEC: 75.0000 mg | DELAYED_RELEASE_TABLET | Freq: Two times a day (BID) | ORAL | 0 refills | Status: DC

## 2018-12-06 NOTE — Patient Instructions (Signed)
It was very nice to see you today!  Your EKG today is normal.  I think you are having inflammation in your chest wall.  Please try the Voltaren and Protonix.  Please take Voltaren twice daily for the next week.  Take the Protonix once daily for the next week.  Let me know or let Dr. Jonni Sanger know if your symptoms worsening or not improving.  Take care, Dr [name]  Please try these tips to maintain a healthy lifestyle:   Eat at least 3 REAL meals and 1-2 snacks per day.  Aim for no more than 5 hours between eating.  If you eat breakfast, please do so within one hour of getting up.    Obtain twice as many fruits/vegetables as protein or carbohydrate foods for both lunch and dinner. (Half of each meal should be fruits/vegetables, one quarter protein, and one quarter starchy carbs)   Cut down on sweet beverages. This includes juice, soda, and sweet tea.    Exercise at least 150 minutes every week.

## 2018-12-06 NOTE — Progress Notes (Signed)
   Chief Complaint:  Diane Alexander is a 58 y.o. female who presents today with a chief complaint of chest Alexander.   Assessment/Plan:  Chest Alexander Likely musculoskeletal.  Alexander is reproducible on exam.  EKG is normal -doubt cardiac etiology.  Will start diclofenac 75 mg twice daily.  Also start Protonix for GI protection.  This would additionally treat for any esophagitis or esophageal component.  She will follow-up with me or her PCP if her symptoms worsen.  Discussed reasons to return to care and seek emergent care.    Subjective:  HPI:  Chest Alexander  Patient had similar episode several months ago.  Was started on cyclobenzaprine.  This helped modestly.  She did not have any issues till few days ago.  Symptoms suddenly worsen.  Located middle of her chest.  Worse with certain motions and activities.  Feels like a spasm sensation.  No reported shortness of breath.  She is also having some left jaw numbness.  This has been going on for several weeks as well.  She has tried taking Advil which helped modestly.  She also tried taking a leftover dose of Flexeril which is helped.  No nausea or vomiting.  She has had some bloating.  No other obvious aggravating or alleviating factors.  ROS: Per HPI  PMH: She reports that she has never smoked. She has never used smokeless tobacco. She reports that she does not drink alcohol or use drugs.      Objective:  Physical Exam: BP 138/86   Pulse 81   Temp 98.2 F (36.8 C)   Ht 5\' 4"  (1.626 m)   Wt 202 lb 4 oz (91.7 kg)   LMP  (LMP Unknown)   SpO2 100%   BMI 34.72 kg/m   Gen: NAD, resting comfortably CV: Regular rate and rhythm with no murmurs appreciated Pulm: Normal work of breathing, clear to auscultation bilaterally with no crackles, wheezes, or rhonchi Chest: No deformities.  Tender to palpation along left sternal border. GI: Normal bowel sounds present. Soft, Nontender, Nondistended. MSK: No edema, cyanosis, or clubbing noted Skin: Warm, dry  Neuro: Grossly normal, moves all extremities Psych: Normal affect and thought content  EKG: Normal sinus rhythm.  No ischemic changes.      Algis Greenhouse. Diane Pain, MD 12/06/2018 2:43 PM

## 2018-12-06 NOTE — Telephone Encounter (Signed)
Pt called with complaints of muscle spasms in her chest wall; the pt says previously took cyclobenzaprine when she had them before; the pt says it has not completley gone away since she was seen; the pt says the pain has been constant since 12/03/2018 but is worse hen she tries to exercise; the pt was seen by Dr Juleen China on 04/30/2018; she was also diagnosed with COVID 10/02/2018; recommendations made per nurse triage protocol; she verbalized understanding; she would like to be seen by her PCP; the pt sees Dr Jonni Sanger, [facility]; pt transferred to [place] for scheduling; will route to office for notification.   Reason for Disposition . Patient sounds very sick or weak to the triager  Answer Assessment - Initial Assessment Questions 1. LOCATION: "Where does it hurt?"      Center of upper chest wall; radiates to the left 2. RADIATION: "Does the pain go anywhere else?" (e.g., into neck, jaw, arms, back)     back 3. ONSET: "When did the chest pain begin?" (Minutes, hours or days)      12/03/2018  4. PATTERN "Does the pain come and go, or has it been constant since it started?"  "Does it get worse with exertion?"     Intermittent; yes when pt tries to exercise 5. DURATION: "How [place] does it last" (e.g., seconds, minutes, hours)     contstant since 12/1/20202 6. SEVERITY: "How bad is the pain?"  (e.g., Scale 1-10; mild, moderate, or severe)    - MILD (1-3): doesn't interfere with normal activities     - MODERATE (4-7): interferes with normal activities or awakens from sleep    - SEVERE (8-10): excruciating pain, unable to do any normal activities       Rated 5-6 out of 10 7. CARDIAC RISK FACTORS: "Do you have any history of heart problems or risk factors for heart disease?" (e.g., angina, prior heart attack; diabetes, high blood pressure, high cholesterol, smoker, or strong family history of heart disease)     HTN 8. PULMONARY RISK FACTORS: "Do you have any history of lung disease?"  (e.g., blood clots  in lung, asthma, emphysema, birth control pills)   no 9. CAUSE: "What do you think is causing the chest pain?"    Not sure 10. OTHER SYMPTOMS: "Do you have any other symptoms?" (e.g., dizziness, nausea, vomiting, sweating, fever, difficulty breathing, cough)       no 11. PREGNANCY: "Is there any chance you are pregnant?" "When was your last menstrual period?"       No hysterectomy  Protocols used: CHEST PAIN-A-AH

## 2018-12-06 NOTE — Telephone Encounter (Signed)
Patient scheduled. Noted.

## 2018-12-06 NOTE — Telephone Encounter (Signed)
See note

## 2019-01-09 ENCOUNTER — Other Ambulatory Visit: Payer: Self-pay | Admitting: Family Medicine

## 2019-01-09 DIAGNOSIS — I10 Essential (primary) hypertension: Secondary | ICD-10-CM

## 2019-01-13 ENCOUNTER — Other Ambulatory Visit: Payer: Self-pay

## 2019-01-13 ENCOUNTER — Telehealth: Payer: Self-pay

## 2019-01-13 DIAGNOSIS — E890 Postprocedural hypothyroidism: Secondary | ICD-10-CM

## 2019-01-13 MED ORDER — LEVOTHYROXINE SODIUM 112 MCG PO TABS: 112.0000 ug | ORAL_TABLET | Freq: Every day | ORAL | 3 refills | Status: DC

## 2019-01-13 NOTE — Telephone Encounter (Signed)
Pharmacy called to ask Dr Jonni Sanger permission to change the patients Rx for levothyroxine (SYNTHROID) 112 MCG tablet  To the brand that they are now using please call Ph# [phone]

## 2019-01-13 NOTE — Telephone Encounter (Signed)
Outpatient Medication Detail   Disp Refills Start End   levothyroxine (SYNTHROID) 112 MCG tablet 90 tablet 3 01/13/2019    Sig - Route: Take 1 tablet (112 mcg total) by mouth daily before breakfast. DR. Loanne Drilling HAS AGREED TO CHANGE MANUFACTURERS IF NEEDED. PLEASE FILL - Oral   Sent to pharmacy as: levothyroxine (SYNTHROID) 112 MCG tablet   E-Prescribing Status: Receipt confirmed by pharmacy (01/13/2019 12:48 PM EST)    Permission to change manufacturers has been provide in Rx above.

## 2019-01-13 NOTE — Telephone Encounter (Signed)
Pharmacy notified that this was ok.

## 2019-01-13 NOTE — Telephone Encounter (Signed)
pharmacy told patient that they were changing the name of her levothyroxine (SYNTHROID) 112 MCG tablet and they need Dr to call and let them know its ok     Please advise

## 2019-01-13 NOTE — Telephone Encounter (Signed)
See request °

## 2019-02-06 MED FILL — PHENTERMINE 37.5 MG TABLET: 37.5 | 60 days supply | Qty: 30 | Fill #1

## 2019-02-12 ENCOUNTER — Other Ambulatory Visit: Payer: Self-pay

## 2019-02-12 ENCOUNTER — Telehealth: Payer: Self-pay | Admitting: Family Medicine

## 2019-02-12 DIAGNOSIS — I10 Essential (primary) hypertension: Secondary | ICD-10-CM

## 2019-02-12 MED ORDER — AMLODIPINE BESYLATE 5 MG PO TABS: 5.0000 mg | ORAL_TABLET | Freq: Every day | ORAL | 8 refills | Status: DC

## 2019-02-12 MED FILL — AMLODIPINE BESYLATE 5 MG TA: 5 | 90 days supply | Qty: 90 | Fill #0

## 2019-02-12 NOTE — Telephone Encounter (Signed)
30 day supply sent in vs 90 day supply of amlodipine to [facility]

## 2019-02-12 NOTE — Telephone Encounter (Signed)
  LAST APPOINTMENT DATE: 01/09/2019   NEXT APPOINTMENT DATE:@5 /20/2021  MEDICATION:amLODipine (NORVASC) 5 MG tablet  [facility], [place].  Comments: Patient is needing 30 day supply vs 90 day due to the 30 being cheaper, patient is completely out of medication.  **Let patient know to contact pharmacy at the end of the day to make sure medication is ready. **  ** Please notify patient to allow 48-72 hours to process**  **Encourage patient to contact the pharmacy for refills or they can request refills through [facility]**  CLINICAL FILLS OUT ALL BELOW:   LAST REFILL:  QTY:  REFILL DATE:    OTHER COMMENTS:    Okay for refill?  Please advise

## 2019-02-14 ENCOUNTER — Encounter: Payer: Self-pay | Admitting: Family Medicine

## 2019-02-14 NOTE — Telephone Encounter (Signed)
Patient is calling to follow up on my chart message that was sent this morning. Patient would like to speak with the doctor or nurse regarding her skin peeling.

## 2019-02-17 NOTE — Telephone Encounter (Signed)
Called pt and scheduled appt

## 2019-02-18 ENCOUNTER — Other Ambulatory Visit: Payer: Self-pay

## 2019-02-18 ENCOUNTER — Ambulatory Visit (INDEPENDENT_AMBULATORY_CARE_PROVIDER_SITE_OTHER): Payer: Managed Care, Other (non HMO) | Admitting: Family Medicine

## 2019-02-18 VITALS — BP 110/68 | HR 64 | Temp 97.8°F | Ht 64.0 in | Wt 204.8 lb

## 2019-02-18 DIAGNOSIS — L819 Disorder of pigmentation, unspecified: Secondary | ICD-10-CM

## 2019-02-18 DIAGNOSIS — S81802A Unspecified open wound, left lower leg, initial encounter: Secondary | ICD-10-CM | POA: Diagnosis not present

## 2019-02-18 MED ORDER — DOXYCYCLINE HYCLATE 100 MG PO TABS: 100.0000 mg | ORAL_TABLET | Freq: Two times a day (BID) | ORAL | 0 refills | Status: AC

## 2019-02-18 MED ORDER — DOXYCYCLINE HYCLATE 100 MG PO TABS: 100.0000 mg | ORAL_TABLET | Freq: Two times a day (BID) | ORAL | 0 refills | Status: DC

## 2019-02-18 NOTE — Patient Instructions (Signed)
Please follow up as scheduled for your next visit with me: 05/22/2019   If you have any questions or concerns, please don't hesitate to send me a message via MyChart or call the office at [phone]. Thank you for visiting with Diane Alexander today! It's our pleasure caring for you.  Use vaseline on wound and give it more time. I've ordered antibiotics to take.  Check your boots to see if something is rubbing on your leg.

## 2019-02-18 NOTE — Progress Notes (Signed)
Subjective  CC:  Chief Complaint  Patient presents with  . Wound Check    left ankle scab and discoloration. Noticed scab around 02/08/2019    HPI: Diane Alexander is a 59 y.o. female who presents to the office today to address the problems listed above in the chief complaint.  59 yo with 10 day history of sore on left ankle; started after wearing a pair of new boots. She believes the boots irritated her skin: red spot noted initially. Since "wound" has not healed. Not growing but red, itches and sore and reports h/o pus. No surrounding redness or pain. No f/c/s. Not growing. Has used peroxide and neosporin. It is drier now but she worries as it is not healed. Also noted to horizontal lines on the anterior lower legs. They are symmetric.   C/o skin changes on face. No itching or pain. Notes discoloration. Wants to know if it is due to mask.  Assessment  1. Leg wound, left, initial encounter   2. Hypopigmented skin lesion      Plan   Leg wound:  Appears to be an ulceration, perhaps from mechanical irritation. Treat for infection and wound care and monitor over next 1-2 weeks. If not improving, needs bx.  Refer to derm for hypopigmentation on face. Pt is very worried.   Linear lines on legs appear to be scratches: pt to check inside of boots as this seems to be the likely culprit.   Follow up: No follow-ups on file.  05/22/2019  Orders Placed This Encounter  Procedures  . Ambulatory referral to Dermatology   Meds ordered this encounter  Medications  . DISCONTD: doxycycline (VIBRA-TABS) 100 MG tablet    Sig: Take 1 tablet (100 mg total) by mouth 2 (two) times daily for 7 days.    Dispense:  14 tablet    Refill:  0      I reviewed the patients updated PMH, FH, and SocHx.    Patient Active Problem List   Diagnosis Date Noted  . Hormone replacement therapy (HRT) 11/22/2018    Priority: High  . Hypothyroidism following RAI 12/2016, on Levothyroxine 05/20/2018    Priority:  High  . Essential hypertension 02/02/2017    Priority: High  . Constipation, chronic idiopathic, Rx Linzess as needed 05/23/2018    Priority: Medium  . Diverticulosis 05/23/2018    Priority: Medium  . OA (osteoarthritis) of knee, bilateral 10/31/2017    Priority: Medium  . Vasomotor symptoms due to menopause 10/31/2017    Priority: Medium  . Obesity (BMI 30-39.9) 02/02/2017    Priority: Medium  . Adjustment insomnia 02/02/2017    Priority: Medium  . Plantar fascia syndrome 02/09/2018    Priority: Low  . Seasonal allergic rhinitis due to pollen 02/02/2017    Priority: Low  . Arthralgia of multiple joints, with negative Rheum panel 02/02/2017    Priority: Low  . Vitamin D deficiency, taking 2000 IU daily 02/02/2017    Priority: Low  . Lactose intolerance 02/02/2017    Priority: Low  . Elevated LFTs, worked up by GI, essentially normal, thought to be due to mobic 05/23/2018   Current Meds  Medication Sig  . amLODipine (NORVASC) 5 MG tablet Take 1 tablet (5 mg total) by mouth daily.  . calcium carbonate (TUMS - DOSED IN MG ELEMENTAL CALCIUM) 500 MG chewable tablet Chew 1 tablet by mouth daily.  . calcium-vitamin D (OSCAL WITH D) 500-200 MG-UNIT tablet Take 1 tablet by mouth.  Marland Kitchen  diclofenac (VOLTAREN) 75 MG EC tablet Take 1 tablet (75 mg total) by mouth 2 (two) times daily.  . diclofenac sodium (VOLTAREN) 1 % GEL Apply 2 g topically 4 (four) times daily.  Marland Kitchen estradiol (ESTRACE) 2 MG tablet estradiol 2 mg tablet  . Lactase (LACTAID PO) Take by mouth.  . levothyroxine (SYNTHROID) 112 MCG tablet Take 1 tablet (112 mcg total) by mouth daily before breakfast. DR. Loanne Drilling HAS AGREED TO CHANGE MANUFACTURERS IF NEEDED. PLEASE FILL  . linaclotide (LINZESS) 72 MCG capsule Take 1 capsule (72 mcg total) by mouth daily before breakfast.  . losartan-hydrochlorothiazide (HYZAAR) 100-25 MG tablet TAKE ONE TABLET BY MOUTH ONE TIME DAILY   . pantoprazole (PROTONIX) 40 MG tablet Take 1 tablet (40 mg  total) by mouth daily.  . phentermine (ADIPEX-P) 37.5 MG tablet 1/2 po daily  . polyethylene glycol (MIRALAX / GLYCOLAX) packet Take 17 g by mouth daily.    Allergies: Patient has No Known Allergies. Family History: Patient family history includes Cancer in her maternal grandmother; Heart attack in her father; Heart disease in her mother; Lymphoma (age of onset: 42) in her sister. Social History:  Patient  reports that she has never smoked. She has never used smokeless tobacco. She reports that she does not drink alcohol or use drugs.  Review of Systems: Constitutional: Negative for fever malaise or anorexia Cardiovascular: negative for chest pain Respiratory: negative for SOB or persistent cough Gastrointestinal: negative for abdominal pain  Objective  Vitals: BP 110/68 (BP Location: Left Arm, Patient Position: Sitting, Cuff Size: Large)   Pulse 64   Temp 97.8 F (36.6 C) (Temporal)   Ht 5\' 4"  (1.626 m)   Wt 204 lb 12.8 oz (92.9 kg)   LMP  (LMP Unknown)   SpO2 98%   BMI 35.15 kg/m  General: no acute distress , A&Ox3 Skin:  Warm, face: 2-3 mm annular hypopigmented lesions on face, below right nostril and on right chin, no flaking or erythema Left lateral lower leg with about 71mm oval lesion with scab. No drainage. No erythema or warmth. No fluctuance.  Ant lower shin with symmetric linear abrasions     Commons side effects, risks, benefits, and alternatives for medications and treatment plan prescribed today were discussed, and the patient expressed understanding of the given instructions. Patient is instructed to call or message via MyChart if he/she has any questions or concerns regarding our treatment plan. No barriers to understanding were identified. We discussed Red Flag symptoms and signs in detail. Patient expressed understanding regarding what to do in case of urgent or emergency type symptoms.   Medication list was reconciled, printed and provided to the patient in  AVS. Patient instructions and summary information was reviewed with the patient as documented in the AVS. This note was prepared with assistance of Dragon voice recognition software. Occasional wrong-word or sound-a-like substitutions may have occurred due to the inherent limitations of voice recognition software  This visit occurred during the SARS-CoV-2 public health emergency.  Safety protocols were in place, including screening questions prior to the visit, additional usage of staff PPE, and extensive cleaning of exam room while observing appropriate contact time as indicated for disinfecting solutions.

## 2019-03-15 ENCOUNTER — Ambulatory Visit: Payer: Managed Care, Other (non HMO) | Attending: Internal Medicine

## 2019-03-15 DIAGNOSIS — Z23 Encounter for immunization: Secondary | ICD-10-CM

## 2019-03-15 NOTE — Progress Notes (Signed)
   Covid-19 Vaccination Clinic  Name:  CHAVELA CRINCOLI    MRN: [id number] DOB: 03/26/1960  03/15/2019  [name] was observed post Covid-19 immunization for 15 minutes without incident. She was provided with Vaccine Information Sheet and instruction to access the V-Safe system.   [name] was instructed to call 911 with any severe reactions post vaccine: Marland Kitchen Difficulty breathing  . Swelling of face and throat  . A fast heartbeat  . A bad rash all over body  . Dizziness and weakness   Immunizations Administered    Name Date Dose VIS Date Route   Moderna COVID-19 Vaccine 03/15/2019  9:37 AM 0.5 mL 12/03/2018 Intramuscular   Manufacturer: Moderna   Lot: [id number]   [id number]

## 2019-04-02 ENCOUNTER — Ambulatory Visit (INDEPENDENT_AMBULATORY_CARE_PROVIDER_SITE_OTHER): Payer: PRIVATE HEALTH INSURANCE | Admitting: Dermatology

## 2019-04-02 ENCOUNTER — Encounter: Payer: Self-pay | Admitting: Dermatology

## 2019-04-02 ENCOUNTER — Other Ambulatory Visit: Payer: Self-pay

## 2019-04-02 DIAGNOSIS — Z8616 Personal history of COVID-19: Secondary | ICD-10-CM

## 2019-04-02 DIAGNOSIS — L819 Disorder of pigmentation, unspecified: Secondary | ICD-10-CM | POA: Diagnosis not present

## 2019-04-04 NOTE — Progress Notes (Addendum)
   New Patient   Subjective  Diane Alexander is a 59 y.o. female who presents for the following: Skin Problem (Here today because she has noticed skin discoloration on her face.  Had COVID in the fall and she noticed the two patches afterwards. No sensation in the two patches.).  discoloration Location: face Duration: months Quality: stable Associated Signs/Symptoms: Modifying Factors:  Severity:  Timing: Context: onset post COVID   The following portions of the chart were reviewed this encounter and updated as appropriate:     Objective  Well appearing patient in no apparent distress; mood and affect are within normal limits.  A focused examination was performed including face, head, neck, arms. Relevant physical exam findings are noted in the Assessment and Plan.   Assessment & Plan  Dyschromia Right Upper Cutaneous Lip  Discussed in detail ways to minimize appearance of hypopigmentation as well as available options to attempt repigmentation.  Biopsy not currently indicated.  No sign of spontaneous pigment loss ears, eyes, nose, mouth, fingers, extensors.

## 2019-04-09 ENCOUNTER — Telehealth: Payer: Self-pay | Admitting: Family Medicine

## 2019-04-09 ENCOUNTER — Ambulatory Visit (INDEPENDENT_AMBULATORY_CARE_PROVIDER_SITE_OTHER): Payer: PRIVATE HEALTH INSURANCE | Admitting: Family Medicine

## 2019-04-09 ENCOUNTER — Ambulatory Visit (INDEPENDENT_AMBULATORY_CARE_PROVIDER_SITE_OTHER): Payer: PRIVATE HEALTH INSURANCE

## 2019-04-09 ENCOUNTER — Encounter: Payer: Self-pay | Admitting: Family Medicine

## 2019-04-09 ENCOUNTER — Other Ambulatory Visit: Payer: Self-pay

## 2019-04-09 VITALS — BP 112/72 | HR 90 | Temp 98.5°F | Resp 15 | Wt 203.2 lb

## 2019-04-09 DIAGNOSIS — R0789 Other chest pain: Secondary | ICD-10-CM | POA: Diagnosis not present

## 2019-04-09 DIAGNOSIS — K5904 Chronic idiopathic constipation: Secondary | ICD-10-CM

## 2019-04-09 DIAGNOSIS — E890 Postprocedural hypothyroidism: Secondary | ICD-10-CM

## 2019-04-09 DIAGNOSIS — Z79890 Hormone replacement therapy (postmenopausal): Secondary | ICD-10-CM

## 2019-04-09 DIAGNOSIS — R140 Abdominal distension (gaseous): Secondary | ICD-10-CM | POA: Diagnosis not present

## 2019-04-09 DIAGNOSIS — I10 Essential (primary) hypertension: Secondary | ICD-10-CM | POA: Diagnosis not present

## 2019-04-09 DIAGNOSIS — M940 Chondrocostal junction syndrome [Tietze]: Secondary | ICD-10-CM | POA: Diagnosis not present

## 2019-04-09 IMAGING — DX DG ABDOMEN 1V
1 series · 1 of 1 positions shown · non-contrast
Comparison: [DATE]

CLINICAL DATA: Abdominal pain and distension

EXAM:
ABDOMEN - 1 VIEW

[kub ap]
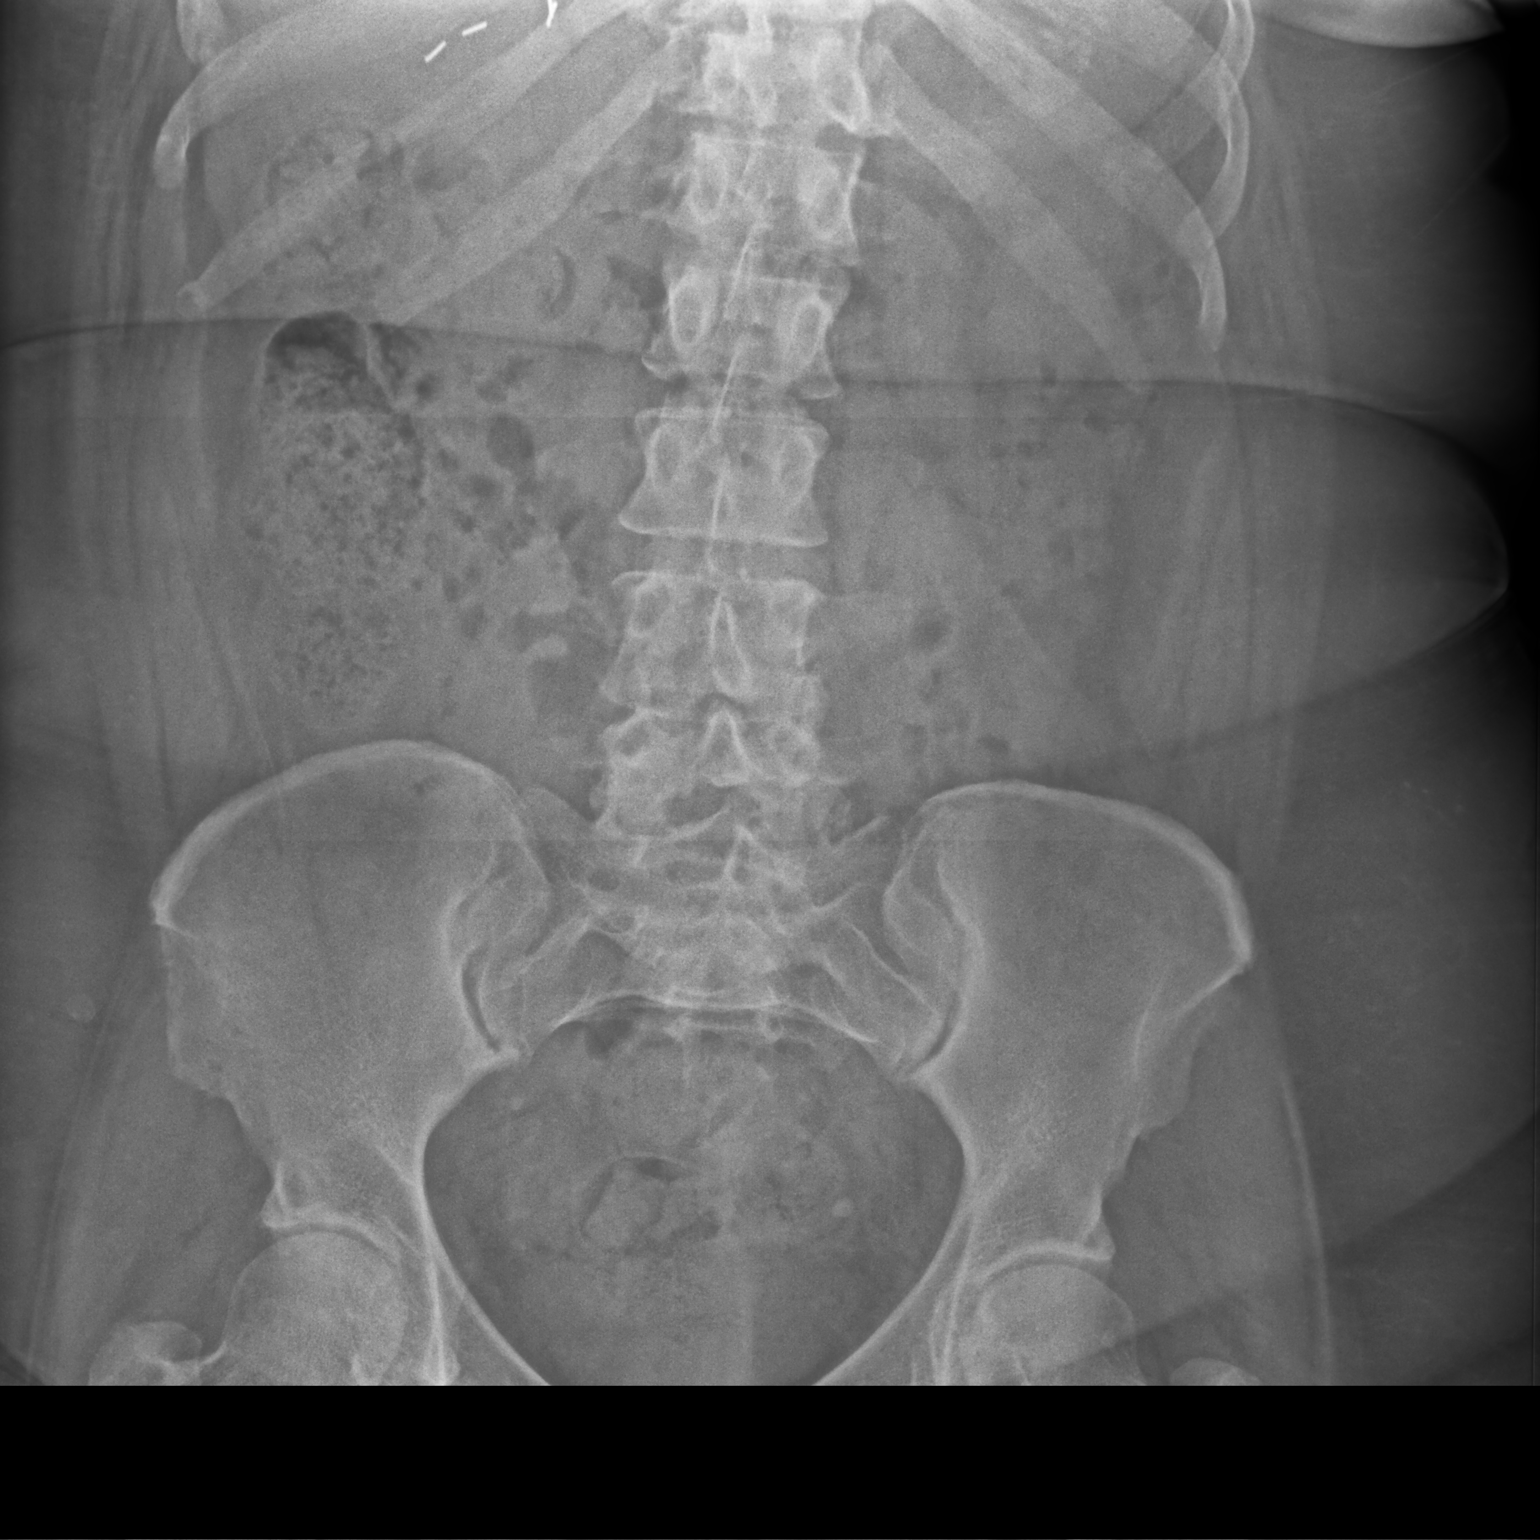

[1 of 1 positions shown; findings below may reference images not displayed]

FINDINGS: Scattered large and small bowel gas is noted. Fecal material is
noted within the colon consistent with a mild degree of
constipation. No obstructive changes are seen. No bony abnormality
is noted.
IMPRESSION: Mild constipation.

## 2019-04-09 IMAGING — DX DG CHEST 2V
2 series · 2 of 2 positions shown · non-contrast
Comparison: [DATE].

CLINICAL DATA: Atypical chest pain.

EXAM:
CHEST - 2 VIEW

[chest pa]
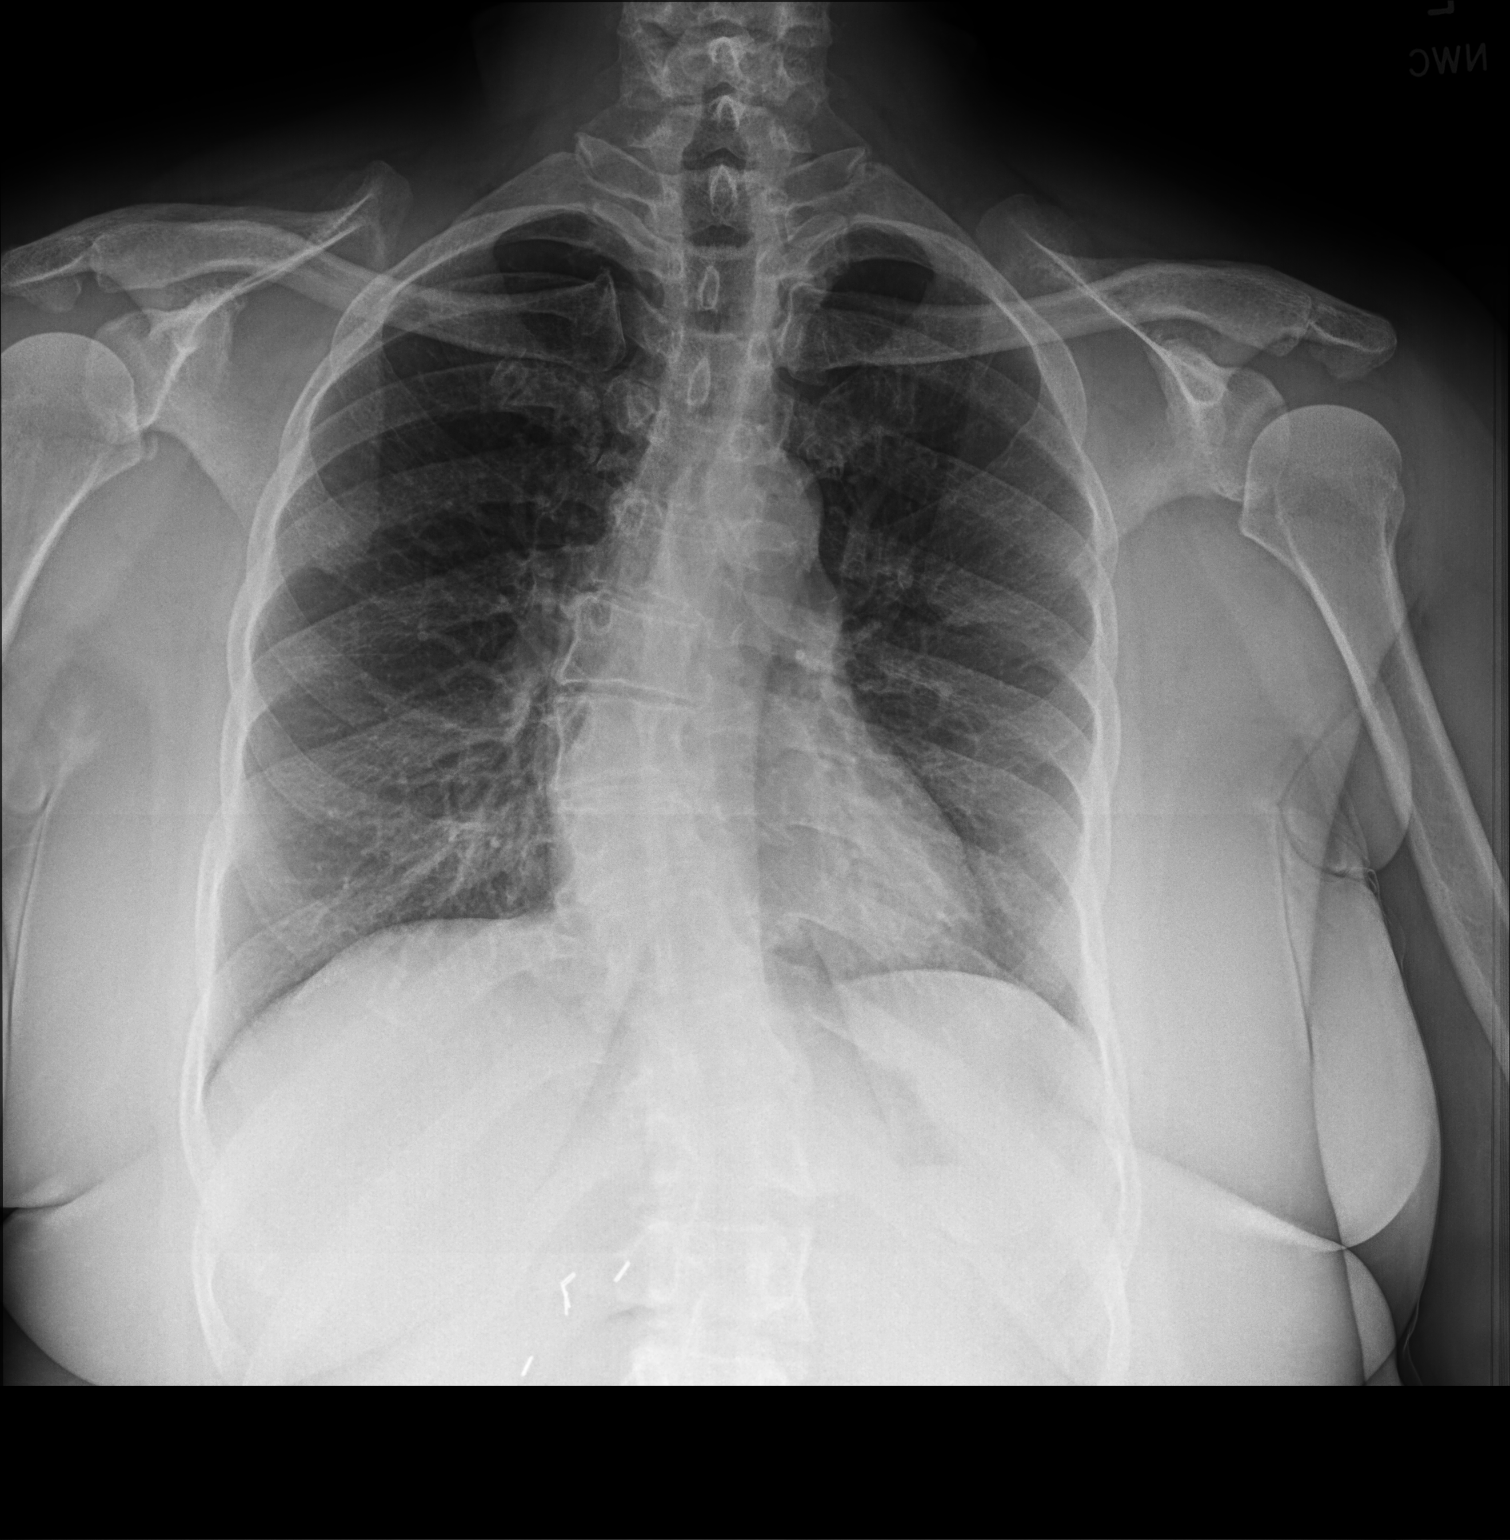

[chest lat]
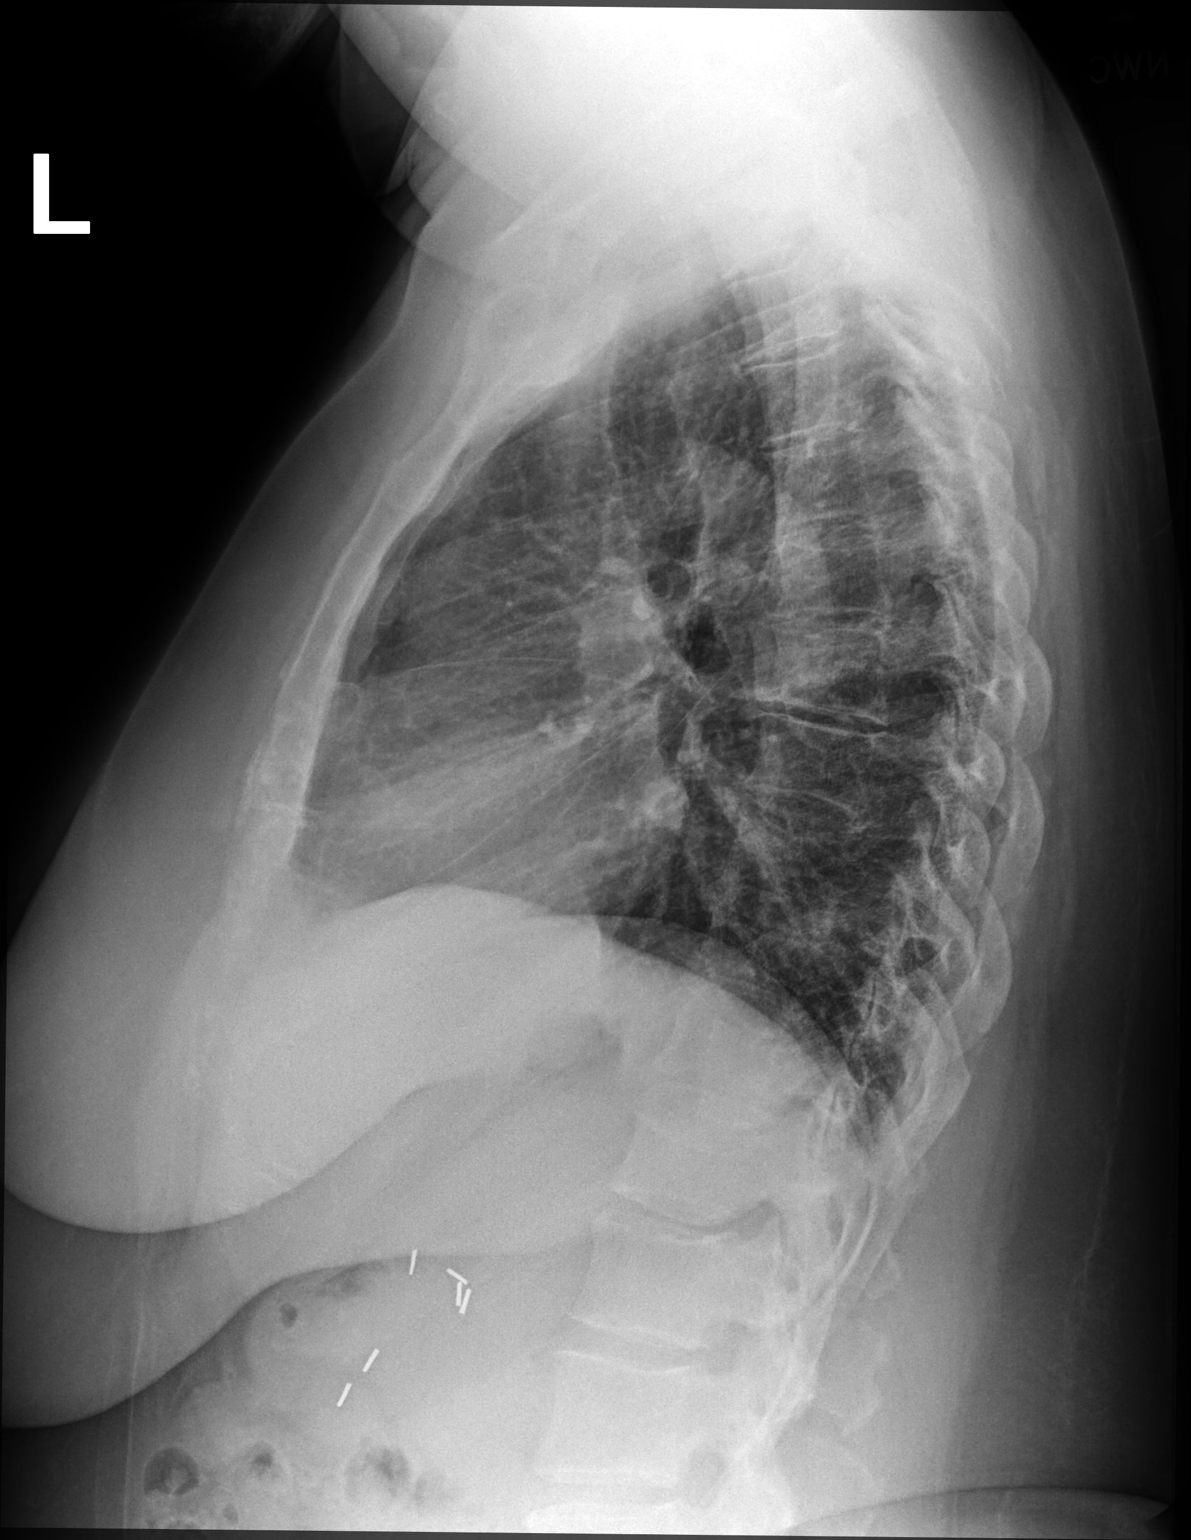

[2 of 2 positions shown; findings below may reference images not displayed]

FINDINGS: Mediastinum and hilar structures normal. Lungs are clear. No pleural
effusion or pneumothorax. Heart size normal. Small bony density
noted along the right shoulder joint most likely small loose body.
Thoracic spine scoliosis and degenerative change. Surgical clips
right upper quadrant.
IMPRESSION: No acute cardiopulmonary disease.

## 2019-04-09 NOTE — Patient Instructions (Addendum)
Please follow up as scheduled for your next visit with me: 05/22/2019   I will release your lab results to you on your MyChart account with further instructions. Please reply with any questions.   Start your linzess daily to help with your constipation.  Take the diclofenac daily to help with the chest wall pain.  Hold the calcium supplements for now as they can contribute to bloating and constipation.   If you have any questions or concerns, please don't hesitate to send me a message via MyChart or call the office at [phone]. Thank you for visiting with [place] today! It's our pleasure caring for you.

## 2019-04-09 NOTE — Telephone Encounter (Signed)
Patient is calling in and states she needs to talk with nurse about the visit and some of the notes/labs that were taken today. Asked for a returned call as soon as possible.

## 2019-04-09 NOTE — Progress Notes (Signed)
Subjective  CC:  Chief Complaint  Patient presents with  . Spasms    in chest, started Saturday  . Bloated    started Saturday, abnormal bowel movements    HPI: Diane Alexander is a 59 y.o. female who presents to the office today to address the problems listed above in the chief complaint.  60 yo w/ HTN on HRT presents with vague sxs: feels a flutter like sensation in her chest on and off for several days. Feels like pressure or "tapping". Non exertional. Doesn't last [place]. No associated n/v, diaphoresis or sob.no GERD sxs but c/o feeling bloating. No Leg edema. + stress looking for a job. No new exercise. Reports is sore on left chest wall. No rash.   Bloating and hard to pass firm small stools. Has h/o constipation. Not using her linzess regularly. Feels full and bloated with gas. No localized abdominal pain. No f/c/s. No melena. Nl appetite.   Assessment  1. Chronic idiopathic constipation   2. Atypical chest pain   3. Bloating   4. Costochondritis   5. Essential hypertension   6. Hormone replacement therapy (HRT)   7. Hypothyroidism following RAI 12/2016, on Levothyroxine      Plan   Atypical chest pain: costochondritis w/ tender chest wall on exam. Start nsaid. Nl EKG. Atypical presentation and low likelihood of being of cardiac etiology. Will monitor.   Constipation: check KUB and restart linzess. Education given. Check labwork to ensure no other abnormalities  Thyroid due for recheck: see if at goal given worsening constipation sxs  HTN is controlled on meds. Check renal function and electrolytes.  Continue HRT.  Follow up: as scheduled:  05/22/2019  Orders Placed This Encounter  Procedures  . DG Abd 1 View  . DG Chest 2 View  . Lipase  . CBC with Differential/Platelet  . Lipid panel  . Comprehensive metabolic panel  . TSH  . EKG 12-Lead   No orders of the defined types were placed in this encounter.     I reviewed the patients updated PMH, FH, and  SocHx.    Patient Active Problem List   Diagnosis Date Noted  . Hormone replacement therapy (HRT) 11/22/2018    Priority: High  . Hypothyroidism following RAI 12/2016, on Levothyroxine 05/20/2018    Priority: High  . Essential hypertension 02/02/2017    Priority: High  . Chronic idiopathic constipation 05/23/2018    Priority: Medium  . Diverticulosis 05/23/2018    Priority: Medium  . OA (osteoarthritis) of knee, bilateral 10/31/2017    Priority: Medium  . Vasomotor symptoms due to menopause 10/31/2017    Priority: Medium  . Obesity (BMI 30-39.9) 02/02/2017    Priority: Medium  . Adjustment insomnia 02/02/2017    Priority: Medium  . Plantar fascia syndrome 02/09/2018    Priority: Low  . Seasonal allergic rhinitis due to pollen 02/02/2017    Priority: Low  . Arthralgia of multiple joints, with negative Rheum panel 02/02/2017    Priority: Low  . Vitamin D deficiency, taking 2000 IU daily 02/02/2017    Priority: Low  . Lactose intolerance 02/02/2017    Priority: Low  . Elevated LFTs, worked up by GI, essentially normal, thought to be due to mobic 05/23/2018   Current Meds  Medication Sig  . amLODipine (NORVASC) 5 MG tablet Take 1 tablet (5 mg total) by mouth daily.  . calcium carbonate (TUMS - DOSED IN MG ELEMENTAL CALCIUM) 500 MG chewable tablet Chew 1 tablet by  mouth daily.  . calcium-vitamin D (OSCAL WITH D) 500-200 MG-UNIT tablet Take 1 tablet by mouth.  . diclofenac (VOLTAREN) 75 MG EC tablet Take 1 tablet (75 mg total) by mouth 2 (two) times daily.  . diclofenac sodium (VOLTAREN) 1 % GEL Apply 2 g topically 4 (four) times daily.  Marland Kitchen estradiol (ESTRACE) 2 MG tablet estradiol 2 mg tablet  . gabapentin (NEURONTIN) 100 MG capsule 1 PO q HS, may increase to 3 PO q HS if needed  . Lactase (LACTAID PO) Take by mouth.  . levothyroxine (SYNTHROID) 112 MCG tablet Take 1 tablet (112 mcg total) by mouth daily before breakfast. DR. Loanne Drilling HAS AGREED TO CHANGE MANUFACTURERS IF NEEDED.  PLEASE FILL  . linaclotide (LINZESS) 72 MCG capsule Take 1 capsule (72 mcg total) by mouth daily before breakfast.  . losartan-hydrochlorothiazide (HYZAAR) 100-25 MG tablet TAKE ONE TABLET BY MOUTH ONE TIME DAILY   . pantoprazole (PROTONIX) 40 MG tablet Take 1 tablet (40 mg total) by mouth daily.  . polyethylene glycol (MIRALAX / GLYCOLAX) packet Take 17 g by mouth daily.    Allergies: Patient has No Known Allergies. Family History: Patient family history includes Cancer in her maternal grandmother; Heart attack in her father; Heart disease in her mother; Lymphoma (age of onset: 4) in her sister. Social History:  Patient  reports that she has never smoked. She has never used smokeless tobacco. She reports that she does not drink alcohol or use drugs.  Review of Systems: Constitutional: Negative for fever malaise or anorexia Cardiovascular: +for chest pain Respiratory: negative for SOB or persistent cough Gastrointestinal: negative for abdominal pain  Objective  Vitals: BP 112/72   Pulse 90   Temp 98.5 F (36.9 C) (Temporal)   Resp 15   Wt 203 lb 3.2 oz (92.2 kg)   LMP  (LMP Unknown)   SpO2 99%   BMI 34.88 kg/m  General: no acute distress , A&Ox3 HEENT: PEERL, conjunctiva normal, neck is supple Cardiovascular:  RRR without murmur or gallop. Tender left costochondral junction; reproduces pain Respiratory:  Good breath sounds bilaterally, CTAB with normal respiratory effort Gastrointestinal: soft, flat abdomen, normal active bowel sounds, no palpable masses, no hepatosplenomegaly, no appreciated hernias Skin:  Warm, no rashes  EKG: NSR and wnl   Commons side effects, risks, benefits, and alternatives for medications and treatment plan prescribed today were discussed, and the patient expressed understanding of the given instructions. Patient is instructed to call or message via MyChart if he/she has any questions or concerns regarding our treatment plan. No barriers to  understanding were identified. We discussed Red Flag symptoms and signs in detail. Patient expressed understanding regarding what to do in case of urgent or emergency type symptoms.   Medication list was reconciled, printed and provided to the patient in AVS. Patient instructions and summary information was reviewed with the patient as documented in the AVS. This note was prepared with assistance of Dragon voice recognition software. Occasional wrong-word or sound-a-like substitutions may have occurred due to the inherent limitations of voice recognition software  This visit occurred during the SARS-CoV-2 public health emergency.  Safety protocols were in place, including screening questions prior to the visit, additional usage of staff PPE, and extensive cleaning of exam room while observing appropriate contact time as indicated for disinfecting solutions.

## 2019-04-09 NOTE — Telephone Encounter (Signed)
Chief Complaint CHEST PAIN (>=21 years) - pain, pressure, heaviness or tightness Reason for Call Symptomatic / Request for Health Information Initial Comment Caller states she has been experiencing chest pain since Saturday. Dr. Jonni Sanger Translation No Nurse Assessment Nurse: Nicki Reaper, RN, Malachy Mood Date/Time Eilene Ghazi Time): 04/09/2019 8:03:01 AM Confirm and document reason for call. If symptomatic, describe symptoms. ---Caller states she is having CP since Saturday, pain comes and goes, rates pain mild, abdomen bloating since Sunday, hard to pass to gas, drinking fluids and voiding, Has the patient had close contact with a person known or suspected to have the novel coronavirus illness OR traveled / lives in area with major community spread (including international travel) in the last 14 days from the onset of symptoms? * If Asymptomatic, screen for exposure and travel within the last 14 days. ---No Does the patient have any new or worsening symptoms? ---Yes Will a triage be completed? ---Yes Related visit to physician within the last 2 weeks? ---No Does the PT have any chronic conditions? (i.e. diabetes, asthma, this includes High risk factors for pregnancy, etc.) ---Yes List chronic conditions. ---HTN Is this a behavioral health or substance abuse call? ---No Guidelines Guideline Title Affirmed Question Affirmed Notes Nurse Date/Time Eilene Ghazi Time) Chest Pain [1] Chest pain lasting < 5 minutes AND [2] NO chest pain or cardiac symptoms (e.g., breathing difficulty, sweating) now (Exception: chest Nicki Reaper, RNMalachy Mood 04/09/2019 8:04:58 AMPLEASE NOTE: All timestamps contained within this report are represented as [place] Standard Time. CONFIDENTIALTY NOTICE: This fax transmission is intended only for the addressee. It contains information that is legally privileged, confidential or otherwise protected from use or disclosure. If you are not the intended recipient, you are strictly prohibited from  reviewing, disclosing, copying using or disseminating any of this information or taking any action in reliance on or regarding this information. If you have received this fax in error, please notify us immediately by telephone so that we can arrange for its return to [place]. Phone: [phone], Toll-Free: [phone], Fax: [phone] Page: 2 of 2 Call Id: [id number] Guidelines Guideline Title Affirmed Question Affirmed Notes Nurse Date/Time Eilene Ghazi Time) pains that last only a few seconds) Disp. Time Eilene Ghazi Time) Disposition Final User 04/09/2019 7:59:03 AM Send to Urgent Paula Compton, [place] 04/09/2019 8:02:00 AM Attempt made - message left Nicki Reaper, RNMalachy Mood 04/09/2019 8:09:17 AM See PCP within 24 Hours Yes Nicki Reaper, RN, Erskine Speed Disagree/Comply Comply Caller Understands Yes PreDisposition Betances Advice Given Per Guideline SEE PCP WITHIN 24 HOURS: * IF OFFICE WILL BE OPEN: You need to be seen within the next 24 hours. Call your doctor (or NP/PA) when the office opens and make an appointment. CALL BACK IF: * Difficulty breathing occurs * Chest pain increases in frequency, duration or severity * Chest pain lasts over 5 minutes * You become worse.

## 2019-04-09 NOTE — Telephone Encounter (Signed)
Spoke with patient, went over all her questions

## 2019-04-09 NOTE — Telephone Encounter (Signed)
FYI, appt this afternoon at 3

## 2019-04-10 LAB — COMPREHENSIVE METABOLIC PANEL
ALT: 23 U/L (ref 0–35)
AST: 17 U/L (ref 0–37)
Albumin: 4.1 g/dL (ref 3.5–5.2)
Alkaline Phosphatase: 73 U/L (ref 39–117)
BUN: 11 mg/dL (ref 6–23)
CO2: 34 mEq/L — ABNORMAL HIGH (ref 19–32)
Calcium: 9.7 mg/dL (ref 8.4–10.5)
Chloride: 96 mEq/L (ref 96–112)
Creatinine, Ser: 0.73 mg/dL (ref 0.40–1.20)
GFR: 98.44 mL/min (ref >60.00)
Glucose, Bld: 106 mg/dL — ABNORMAL HIGH (ref 70–99)
Potassium: 3.3 mEq/L — ABNORMAL LOW (ref 3.5–5.1)
Sodium: 139 mEq/L (ref 135–145)
Total Bilirubin: 0.4 mg/dL (ref 0.2–1.2)
Total Protein: 7.3 g/dL (ref 6.0–8.3)

## 2019-04-10 LAB — CBC WITH DIFFERENTIAL/PLATELET
Basophils Absolute: 0.1 10*3/uL (ref 0.0–0.1)
Basophils Relative: 1.2 % (ref 0.0–3.0)
Eosinophils Absolute: 0.1 10*3/uL (ref 0.0–0.7)
Eosinophils Relative: 1.9 % (ref 0.0–5.0)
HCT: 36.9 % (ref 36.0–46.0)
Hemoglobin: 12.1 g/dL (ref 12.0–15.0)
Lymphocytes Relative: 30.2 % (ref 12.0–46.0)
Lymphs Abs: 2 10*3/uL (ref 0.7–4.0)
MCHC: 32.9 g/dL (ref 30.0–36.0)
MCV: 85.5 fl (ref 78.0–100.0)
Monocytes Absolute: 0.6 10*3/uL (ref 0.1–1.0)
Monocytes Relative: 9.6 % (ref 3.0–12.0)
Neutro Abs: 3.9 10*3/uL (ref 1.4–7.7)
Neutrophils Relative %: 57.1 % (ref 43.0–77.0)
Platelets: 257 10*3/uL (ref 150.0–400.0)
RBC: 4.32 Mil/uL (ref 3.87–5.11)
RDW: 13.3 % (ref 11.5–15.5)
WBC: 6.7 10*3/uL (ref 4.0–10.5)

## 2019-04-10 LAB — LIPID PANEL
Cholesterol: 163 mg/dL (ref 0–200)
HDL: 59.8 mg/dL (ref >39.00)
LDL Cholesterol: 73 mg/dL (ref 0–99)
NonHDL: 102.82
Total CHOL/HDL Ratio: 3
Triglycerides: 151 mg/dL — ABNORMAL HIGH (ref 0.0–149.0)
VLDL: 30.2 mg/dL (ref 0.0–40.0)

## 2019-04-10 LAB — TSH: TSH: 0.4 u[IU]/mL (ref 0.35–4.50)

## 2019-04-10 LAB — LIPASE: Lipase: 36 U/L (ref 11.0–59.0)

## 2019-04-12 ENCOUNTER — Encounter: Payer: Self-pay | Admitting: Family Medicine

## 2019-04-15 ENCOUNTER — Encounter: Payer: Self-pay | Admitting: Family Medicine

## 2019-04-16 ENCOUNTER — Encounter: Payer: Self-pay | Admitting: Family Medicine

## 2019-04-16 ENCOUNTER — Ambulatory Visit: Payer: PRIVATE HEALTH INSURANCE | Attending: Internal Medicine

## 2019-04-16 DIAGNOSIS — Z23 Encounter for immunization: Secondary | ICD-10-CM

## 2019-04-16 NOTE — Progress Notes (Signed)
   Covid-19 Vaccination Clinic  Name:  Diane Alexander    MRN: [id number] DOB: 12-09-1960  04/16/2019  Diane Alexander was observed post Covid-19 immunization for 15 minutes without incident. She was provided with Vaccine Information Sheet and instruction to access the V-Safe system.   Diane Alexander was instructed to call 911 with any severe reactions post vaccine: Marland Kitchen Difficulty breathing  . Swelling of face and throat  . A fast heartbeat  . A bad rash all over body  . Dizziness and weakness   Immunizations Administered    Name Date Dose VIS Date Route   Moderna COVID-19 Vaccine 04/16/2019  8:59 AM 0.5 mL 12/03/2018 Intramuscular   Manufacturer: Moderna   Lot: [id number]   [id number]

## 2019-05-19 MED FILL — AMLODIPINE BESYLATE 5 MG TA: 5 | 90 days supply | Qty: 90 | Fill #1

## 2019-05-22 ENCOUNTER — Encounter: Payer: Self-pay | Admitting: Family Medicine

## 2019-05-22 ENCOUNTER — Ambulatory Visit (INDEPENDENT_AMBULATORY_CARE_PROVIDER_SITE_OTHER): Payer: Managed Care, Other (non HMO) | Admitting: Family Medicine

## 2019-05-22 ENCOUNTER — Ambulatory Visit (INDEPENDENT_AMBULATORY_CARE_PROVIDER_SITE_OTHER): Payer: PRIVATE HEALTH INSURANCE

## 2019-05-22 ENCOUNTER — Other Ambulatory Visit: Payer: Self-pay

## 2019-05-22 VITALS — BP 128/64 | HR 84 | Temp 97.3°F | Resp 18 | Ht 64.0 in | Wt 204.8 lb

## 2019-05-22 DIAGNOSIS — K5904 Chronic idiopathic constipation: Secondary | ICD-10-CM | POA: Diagnosis not present

## 2019-05-22 DIAGNOSIS — E890 Postprocedural hypothyroidism: Secondary | ICD-10-CM

## 2019-05-22 DIAGNOSIS — E559 Vitamin D deficiency, unspecified: Secondary | ICD-10-CM

## 2019-05-22 DIAGNOSIS — M170 Bilateral primary osteoarthritis of knee: Secondary | ICD-10-CM

## 2019-05-22 DIAGNOSIS — M160 Bilateral primary osteoarthritis of hip: Secondary | ICD-10-CM | POA: Insufficient documentation

## 2019-05-22 DIAGNOSIS — Z79890 Hormone replacement therapy (postmenopausal): Secondary | ICD-10-CM | POA: Diagnosis not present

## 2019-05-22 DIAGNOSIS — M25551 Pain in right hip: Secondary | ICD-10-CM

## 2019-05-22 DIAGNOSIS — I10 Essential (primary) hypertension: Secondary | ICD-10-CM | POA: Diagnosis not present

## 2019-05-22 DIAGNOSIS — M25552 Pain in left hip: Secondary | ICD-10-CM | POA: Diagnosis not present

## 2019-05-22 DIAGNOSIS — R7989 Other specified abnormal findings of blood chemistry: Secondary | ICD-10-CM

## 2019-05-22 DIAGNOSIS — F5102 Adjustment insomnia: Secondary | ICD-10-CM

## 2019-05-22 DIAGNOSIS — Z0000 Encounter for general adult medical examination without abnormal findings: Secondary | ICD-10-CM | POA: Diagnosis not present

## 2019-05-22 HISTORY — DX: Bilateral primary osteoarthritis of hip: M16.0

## 2019-05-22 LAB — COMPREHENSIVE METABOLIC PANEL
ALT: 26 U/L (ref 0–35)
AST: 20 U/L (ref 0–37)
Albumin: 4.2 g/dL (ref 3.5–5.2)
Alkaline Phosphatase: 69 U/L (ref 39–117)
BUN: 13 mg/dL (ref 6–23)
CO2: 33 mEq/L — ABNORMAL HIGH (ref 19–32)
Calcium: 9.7 mg/dL (ref 8.4–10.5)
Chloride: 100 mEq/L (ref 96–112)
Creatinine, Ser: 0.68 mg/dL (ref 0.40–1.20)
GFR: 106.8 mL/min (ref >60.00)
Glucose, Bld: 112 mg/dL — ABNORMAL HIGH (ref 70–99)
Potassium: 3.7 mEq/L (ref 3.5–5.1)
Sodium: 138 mEq/L (ref 135–145)
Total Bilirubin: 0.5 mg/dL (ref 0.2–1.2)
Total Protein: 7.5 g/dL (ref 6.0–8.3)

## 2019-05-22 LAB — CBC WITH DIFFERENTIAL/PLATELET
Basophils Absolute: 0 10*3/uL (ref 0.0–0.1)
Basophils Relative: 0.8 % (ref 0.0–3.0)
Eosinophils Absolute: 0.1 10*3/uL (ref 0.0–0.7)
Eosinophils Relative: 2.9 % (ref 0.0–5.0)
HCT: 36.2 % (ref 36.0–46.0)
Hemoglobin: 12.1 g/dL (ref 12.0–15.0)
Lymphocytes Relative: 36.2 % (ref 12.0–46.0)
Lymphs Abs: 1.8 10*3/uL (ref 0.7–4.0)
MCHC: 33.5 g/dL (ref 30.0–36.0)
MCV: 85 fl (ref 78.0–100.0)
Monocytes Absolute: 0.5 10*3/uL (ref 0.1–1.0)
Monocytes Relative: 10.3 % (ref 3.0–12.0)
Neutro Abs: 2.5 10*3/uL (ref 1.4–7.7)
Neutrophils Relative %: 49.8 % (ref 43.0–77.0)
Platelets: 245 10*3/uL (ref 150.0–400.0)
RBC: 4.25 Mil/uL (ref 3.87–5.11)
RDW: 13.5 % (ref 11.5–15.5)
WBC: 5 10*3/uL (ref 4.0–10.5)

## 2019-05-22 LAB — VITAMIN D 25 HYDROXY (VIT D DEFICIENCY, FRACTURES): VITD: 34 ng/mL (ref 30.00–100.00)

## 2019-05-22 IMAGING — DX DG HIP (WITH OR WITHOUT PELVIS) 2V BILAT
5 series · 5 of 5 positions shown · non-contrast
Comparison: None

CLINICAL DATA: BILATERAL hip pain with decreased range of motion

EXAM:
DG HIP (WITH OR WITHOUT PELVIS) 2V BILAT

[pelvis ap]
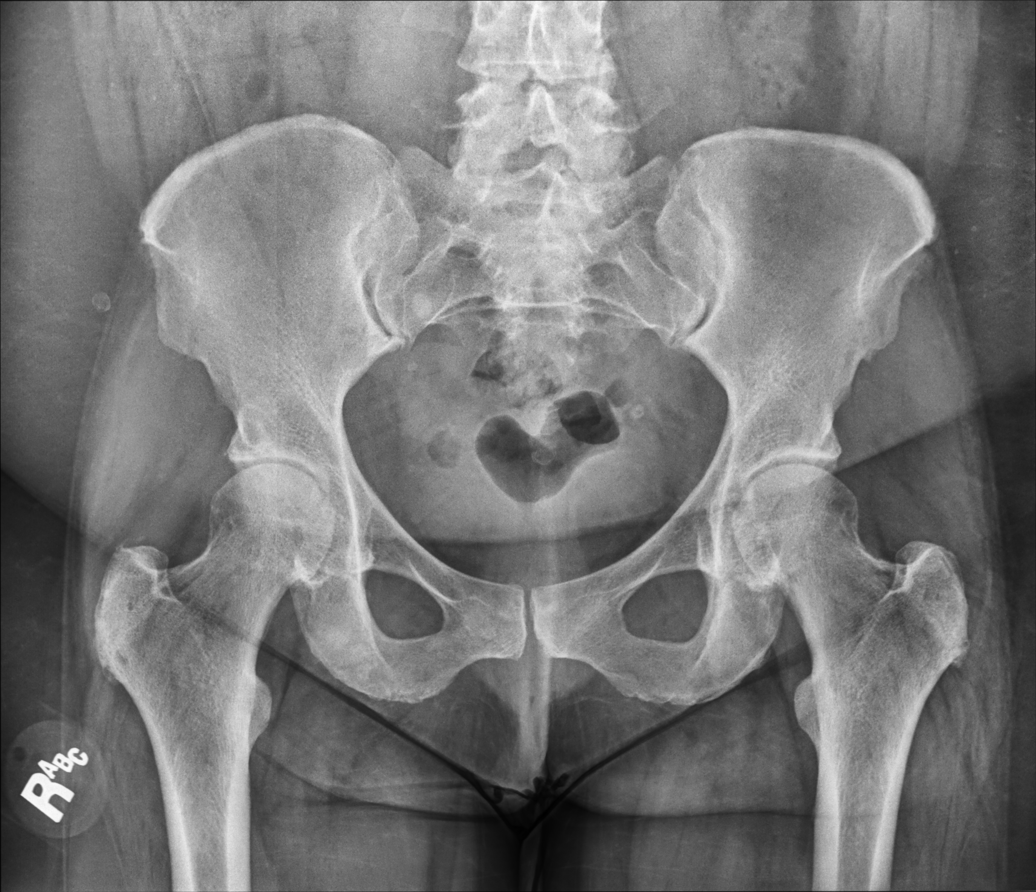

[hip joint ap (1 of 2)]
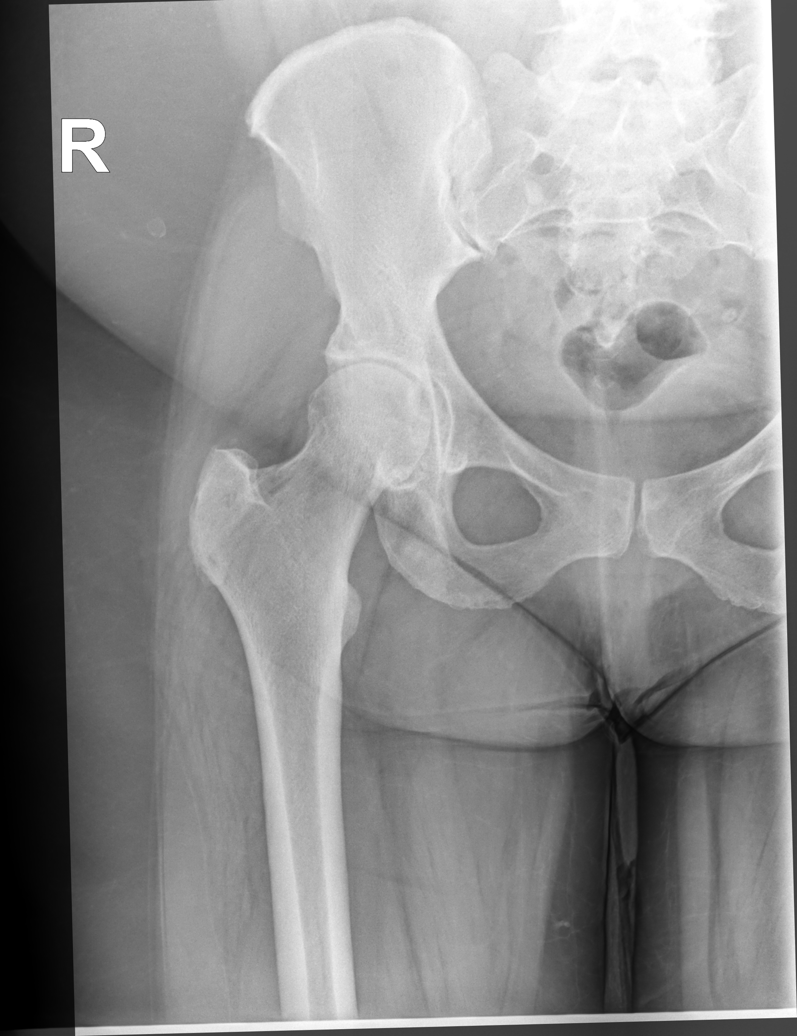

[hip joint (frog view) (1 of 2)]
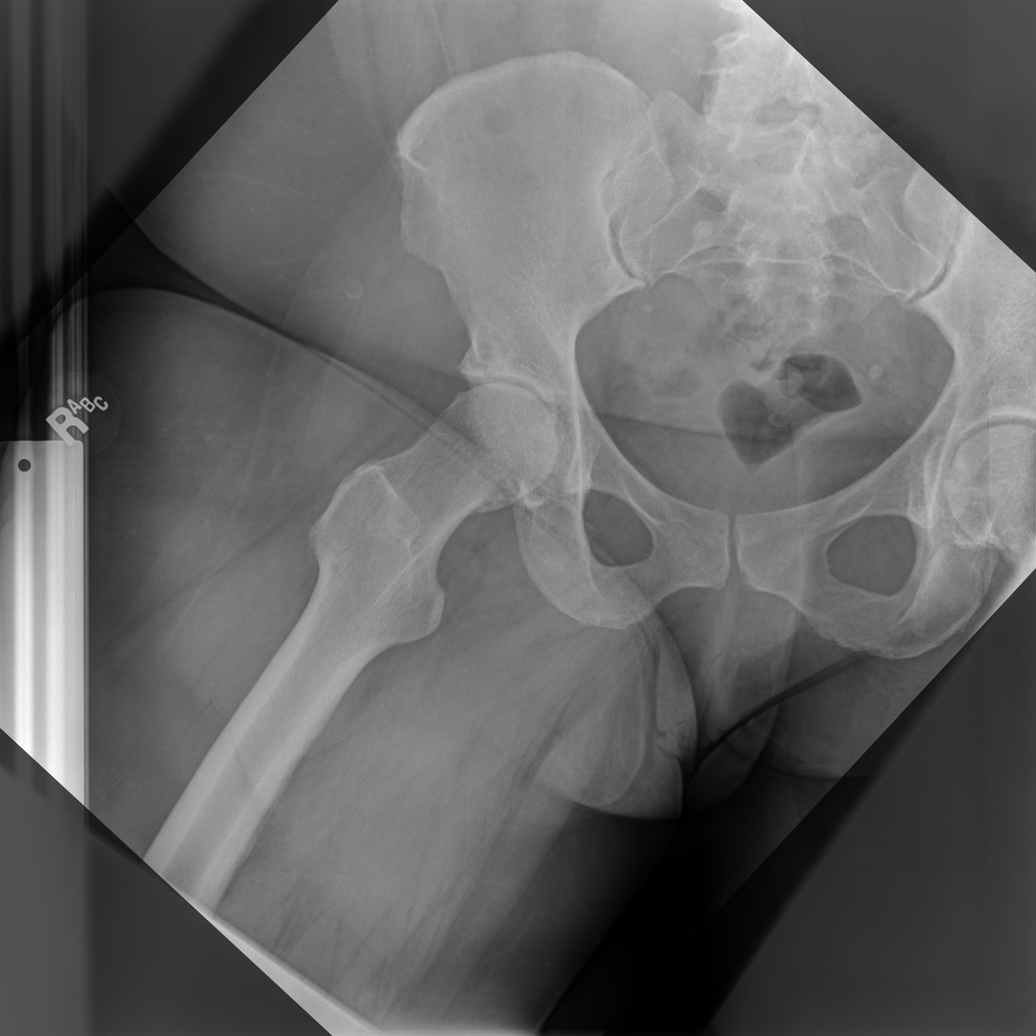

[hip joint ap (2 of 2)]
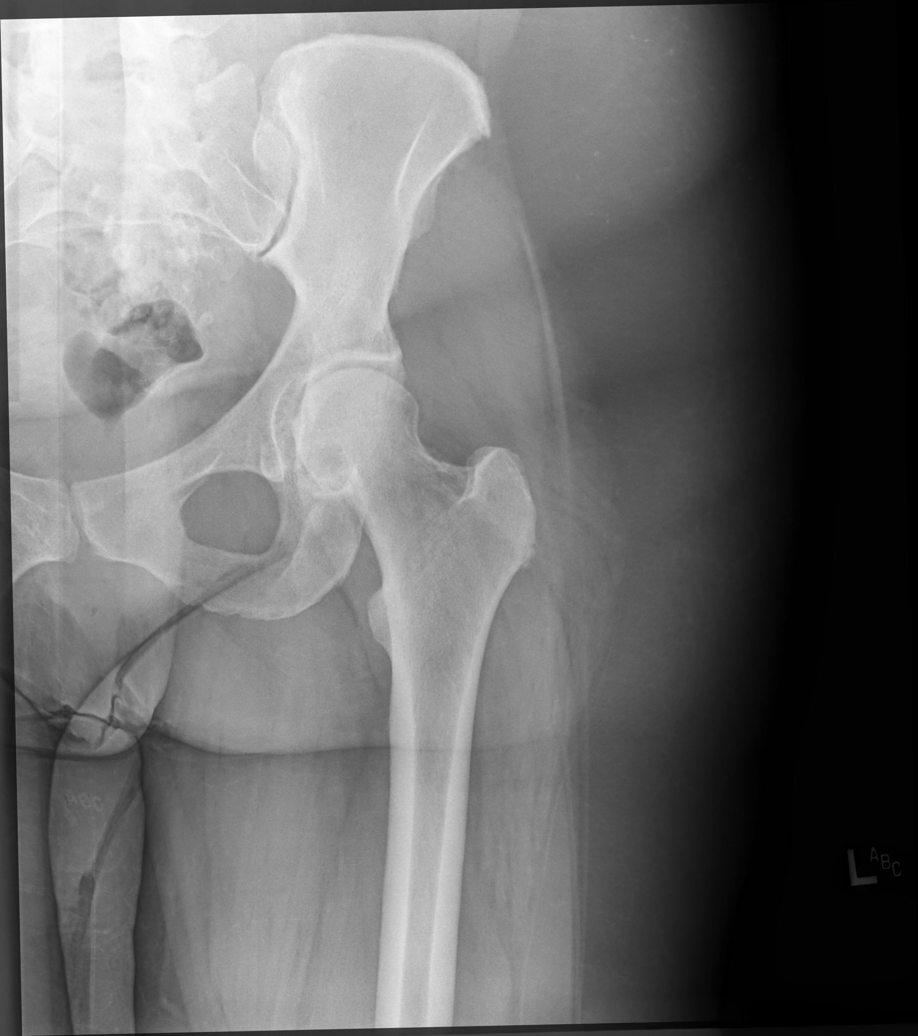

[hip joint (frog view) (2 of 2)]
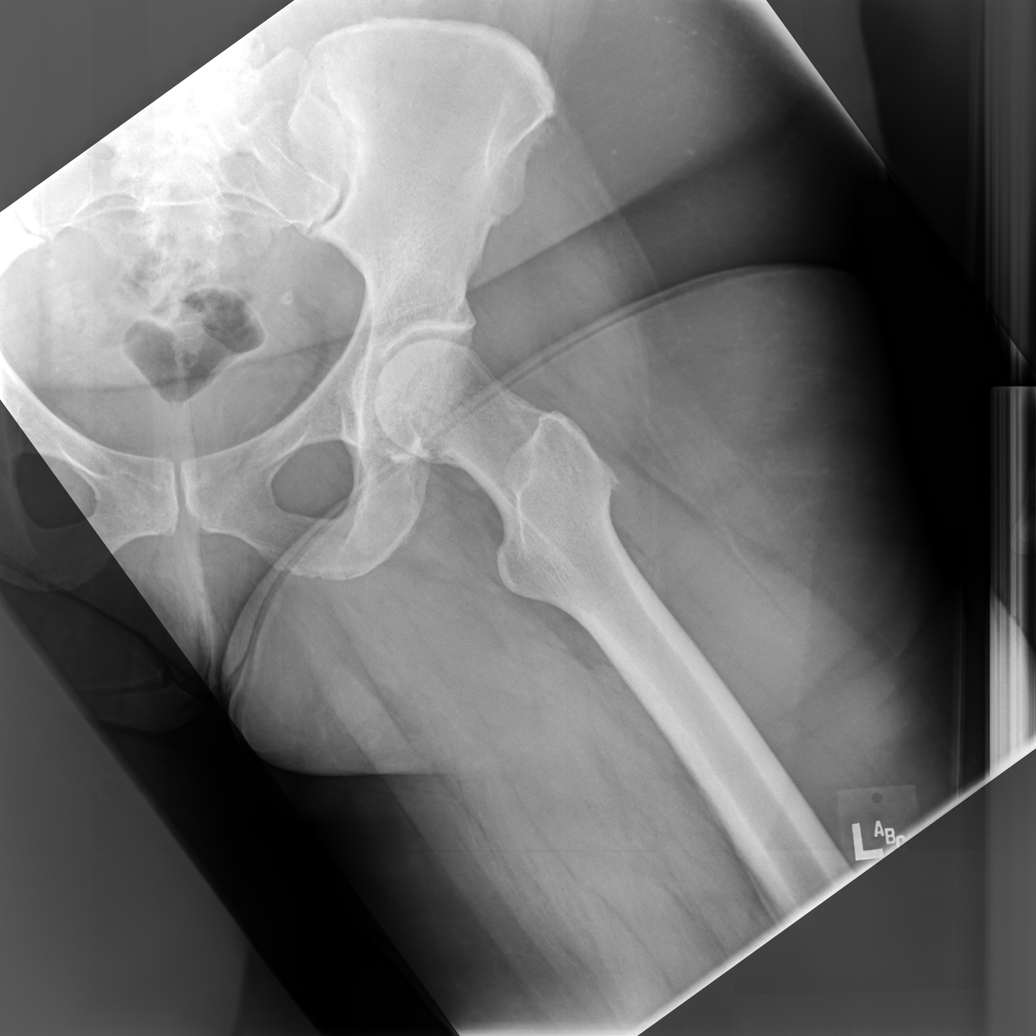

[5 of 5 positions shown; findings below may reference images not displayed]

FINDINGS: Osseous mineralization normal.

SI joint spaces preserved.

Narrowing of hip joints bilaterally slightly greater on RIGHT with
RIGHT femoral head spur formation.

No acute fracture, dislocation, or bone destruction.

Small pelvic phleboliths.
IMPRESSION: Degenerative changes of BILATERAL hip joints without acute
abnormalities.

## 2019-05-22 MED ORDER — IBUPROFEN 200 MG PO TABS
400.0000 mg | ORAL_TABLET | Freq: Every day | ORAL | Status: DC
Start: 2019-05-22 — End: 2020-03-19

## 2019-05-22 NOTE — Progress Notes (Signed)
Subjective  Chief Complaint  Patient presents with  . Annual Exam  . Arthritis    Bilateral hips and legs. She would like to discuss getting a cortisone shot. She has taken some Advil for relief.   . Hypertension  . Hypothyroidism    HPI: Diane Alexander Staff is a 59 y.o. female who presents to [facility] [place] today for a Female Wellness Visit. She also has the concerns and/or needs as listed above in the chief complaint. These will be addressed in addition to the Health Maintenance Visit.   Wellness Visit: annual visit with health maintenance review and exam without Pap   HM: due gyn exam with dr. Corinna Capra and mammogram. Pt to schedule. Eating well. Main concern is arthritis.   Chronic disease f/u and/or acute problem visit: (deemed necessary to be done in addition to the wellness visit):  Hypertension f/u: Control is good . Pt reports she is doing well. taking medications as instructed, no medication side effects noted, no TIAs, no chest pain on exertion, no dyspnea on exertion, no swelling of ankles. She denies adverse effects from his BP medications. Compliance with medication is good. Has intermittent costochondritis on left.   OA: chronic left low back pain and lateral upper leg pain; reviewed notes from sports med from 2020: started PT but had to stop. No relief with gabapentin and baclofen. xrays with mod DJD lumbar spine. Knee pain with mild medial OA changes and patellar spurring. Pt reports had steroid injections in lower back which helped. She exercises regularly and rides her bike. Says she stretches but hip motion is limited. Knees are not hot or swollen. Uses voltren gel on knees with variable relief. Of note, she took two advil for a few days and felt significantly better.   Low thyroid. Checked in April and at goal. No new sxs of high or low thyroid. Takes meds regularly.   Chronic constipation; now improved since being back on linzess.  Vit d deficiency on  otc supplements.  Obesity: exercises. Variable diet.   BP Readings from Last 3 Encounters:  05/22/19 128/64  04/09/19 112/72  02/18/19 110/68   Wt Readings from Last 3 Encounters:  05/22/19 204 lb 12.8 oz (92.9 kg)  04/09/19 203 lb 3.2 oz (92.2 kg)  02/18/19 204 lb 12.8 oz (92.9 kg)    Lab Results  Component Value Date   CHOL 163 04/09/2019   Lab Results  Component Value Date   HDL 59.80 04/09/2019   Lab Results  Component Value Date   LDLCALC 73 04/09/2019   Lab Results  Component Value Date   TRIG 151.0 (H) 04/09/2019   Lab Results  Component Value Date   CHOLHDL 3 04/09/2019   No results found for: LDLDIRECT Lab Results  Component Value Date   CREATININE 0.73 04/09/2019   BUN 11 04/09/2019   NA 139 04/09/2019   K 3.3 (L) 04/09/2019   CL 96 04/09/2019   CO2 34 (H) 04/09/2019    The 10-year ASCVD risk score Mikey Bussing DC Jr., et al., 2013) is: 5%   Values used to calculate the score:     Age: 32 years     Sex: Female     Is Non-Hispanic African American: Yes     Diabetic: No     Tobacco smoker: No     Systolic Blood Pressure: 0000000 mmHg     Is BP treated: Yes     HDL Cholesterol: 59.8 mg/dL  Total Cholesterol: 163 mg/dL   Assessment  1. Annual physical exam   2. Essential hypertension   3. Hypothyroidism following RAI 12/2016, on Levothyroxine   4. Hormone replacement therapy (HRT)   5. Chronic idiopathic constipation   6. Primary osteoarthritis of both knees   7. Adjustment insomnia   8. Vitamin D deficiency, taking 2000 IU daily   9. Elevated LFTs, worked up by GI, essentially normal, thought to be due to mobic   10. Bilateral hip pain      Plan  Female Wellness Visit:  Age appropriate Health Maintenance and Prevention measures were discussed with patient. Included topics are cancer screening recommendations, ways to keep healthy (see AVS) including dietary and exercise recommendations, regular eye and dental care, use of seat belts, and  avoidance of moderate alcohol use and tobacco use. rec gyn exam and mammo. Pt to schedule  BMI: discussed patient's BMI and encouraged positive lifestyle modifications to help get to or maintain a target BMI.  HM needs and immunizations were addressed and ordered. See below for orders. See HM and immunization section for updates. utd  Routine labs and screening tests ordered including cmp, cbc and lipids where appropriate.  Discussed recommendations regarding Vit D and calcium supplementation (see AVS)  Chronic disease management visit and/or acute problem visit:  HTN is controlled. No changes. Check renal function and electrolytes.  Obesity: rec weight loss  Back pain: djd and MSK; pt declines further PT at this point. rec advil daily and recheck if not improving. ? Radiculopathy. Never got MRI due to cost. May refer back to [facility] if needed.  Bilateral hip pain: check xrays. Last checked in 2000. ? OA. ROM is limited on exam  biateral OA: advil and ice. Return if not improving.   Recheck vit D: could be contributing ot leg pain sxs.  Monitor lfts  constipation is improved.   Follow up: Return in about 6 months (around 11/22/2019) for follow up Hypertension.  Orders Placed This Encounter  Procedures  . DG HIPS BILAT W OR W/O PELVIS 2V  . CBC with Differential/Platelet  . Comprehensive metabolic panel  . VITAMIN D 25 Hydroxy (Vit-D Deficiency, Fractures)   Meds ordered this encounter  Medications  . ibuprofen (ADVIL) 200 MG tablet    Sig: Take 2 tablets (400 mg total) by mouth daily.      Lifestyle: Body mass index is 35.15 kg/m. Wt Readings from Last 3 Encounters:  05/22/19 204 lb 12.8 oz (92.9 kg)  04/09/19 203 lb 3.2 oz (92.2 kg)  02/18/19 204 lb 12.8 oz (92.9 kg)    Patient Active Problem List   Diagnosis Date Noted  . Hormone replacement therapy (HRT) 11/22/2018    Priority: High  . Hypothyroidism following RAI 12/2016, on Levothyroxine 05/20/2018     Priority: High  . Essential hypertension 02/02/2017    Priority: High  . Chronic idiopathic constipation 05/23/2018    Priority: Medium  . Diverticulosis 05/23/2018    Priority: Medium  . OA (osteoarthritis) of knee, bilateral 10/31/2017    Priority: Medium  . Vasomotor symptoms due to menopause 10/31/2017    Priority: Medium  . Obesity (BMI 30-39.9) 02/02/2017    Priority: Medium  . Adjustment insomnia 02/02/2017    Priority: Medium  . Plantar fascia syndrome 02/09/2018    Priority: Low    Improving with weight loss.    . Seasonal allergic rhinitis due to pollen 02/02/2017    Priority: Low  . Arthralgia of multiple joints,  with negative Rheum panel 02/02/2017    Priority: Low  . Vitamin D deficiency, taking 2000 IU daily 02/02/2017    Priority: Low  . Lactose intolerance 02/02/2017    Priority: Low  . Elevated LFTs, worked up by GI, essentially normal, thought to be due to [facility] 05/23/2018   Health Maintenance  Topic Date Due  . MAMMOGRAM  10/09/2018  . COLONOSCOPY  07/11/2021  . TETANUS/TDAP  06/02/2026  . COVID-19 Vaccine  Completed  . Hepatitis C Screening  Completed  . HIV Screening  Completed  . INFLUENZA VACCINE  Discontinued   Immunization History  Administered Date(s) Administered  . Moderna SARS-COVID-2 Vaccination 03/15/2019, 04/16/2019  . Tdap 06/01/2016   We updated and reviewed the patient's past history in detail and it is documented below. Allergies: Patient has No Known Allergies. Past Medical History Patient  has a past medical history of Allergy, Constipation, and Lumbar arthropathy. Past Surgical History Patient  has a past surgical history that includes Vaginal hysterectomy; Cholecystectomy; and Breast cyst excision. Family History: Patient family history includes Cancer in her maternal grandmother; Heart attack in her father; Heart disease in her mother; Lymphoma (age of onset: 21) in her sister. Social History:  Patient  reports that she  has never smoked. She has never used smokeless tobacco. She reports that she does not drink alcohol or use drugs.  Review of Systems: Constitutional: negative for fever or malaise Ophthalmic: negative for photophobia, double vision or loss of vision Cardiovascular: negative for chest pain, dyspnea on exertion, or new LE swelling Respiratory: negative for SOB or persistent cough Gastrointestinal: negative for abdominal pain, change in bowel habits or melena Genitourinary: negative for dysuria or gross hematuria, no abnormal uterine bleeding or disharge Musculoskeletal: negative for new gait disturbance or muscular weakness Integumentary: negative for new or persistent rashes, no breast lumps Neurological: negative for TIA or stroke symptoms Psychiatric: negative for SI or delusions Allergic/Immunologic: negative for hives  Patient Care Team    Relationship Specialty Notifications Start End  Leamon Arnt, MD PCP - General Family Medicine  11/22/18   Lady Gary, [facility]    02/08/18   Rolene Course, PA-C Physician Assistant Internal Medicine  08/18/18   Renato Shin, MD Consulting Physician Endocrinology  11/22/18   Louretta Shorten, MD Consulting Physician Obstetrics and Gynecology  11/22/18   Lavonna Monarch, MD Consulting Physician Dermatology  04/02/19     Objective  Vitals: BP 128/64   Pulse 84   Temp (!) 97.3 F (36.3 C) (Temporal)   Resp 18   Ht 5\' 4"  (1.626 m)   Wt 204 lb 12.8 oz (92.9 kg)   LMP  (LMP Unknown)   SpO2 99%   BMI 35.15 kg/m  General:  Well developed, well nourished, no acute distress  Psych:  Alert and orientedx3,normal mood and affect HEENT:  Normocephalic, atraumatic, non-icteric sclera,  supple neck without adenopathy, mass or thyromegaly Cardiovascular:  Normal S1, S2, RRR without gallop, rub or murmur, no edema Respiratory:  Good breath sounds bilaterally, CTAB with normal respiratory effort Gastrointestinal: normal bowel sounds, soft,  non-tender, no noted masses. No HSM MSK: no deformities, contusions. Joints are without erythema or swelling. Bilateral knee crepitus w/o effusion or joint line ttp, FROM but pain at extremes of flexion Left hip with limited external rotation and flexion > right Nl gait Skin:  Warm, no rashes or suspicious lesions noted Neurologic:    Mental status is normal. CN 2-11 are normal. Gross motor and  sensory exams are normal. Normal gait. No tremor    Commons side effects, risks, benefits, and alternatives for medications and treatment plan prescribed today were discussed, and the patient expressed understanding of the given instructions. Patient is instructed to call or message via MyChart if he/she has any questions or concerns regarding our treatment plan. No barriers to understanding were identified. We discussed Red Flag symptoms and signs in detail. Patient expressed understanding regarding what to do in case of urgent or emergency type symptoms.   Medication list was reconciled, printed and provided to the patient in AVS. Patient instructions and summary information was reviewed with the patient as documented in the AVS. This note was prepared with assistance of Dragon voice recognition software. Occasional wrong-word or sound-a-like substitutions may have occurred due to the inherent limitations of voice recognition software  This visit occurred during the SARS-CoV-2 public health emergency.  Safety protocols were in place, including screening questions prior to the visit, additional usage of staff PPE, and extensive cleaning of exam room while observing appropriate contact time as indicated for disinfecting solutions.

## 2019-05-22 NOTE — Patient Instructions (Addendum)
Please return in 6 months for hypertension follow up.  Please start advil 2 pills daily for your back, hip and knee pain.  Return if your pain is not improving.   Today I have xrayed your hips.  Keep stretching.  Ice the knees if they become swollen.   I will release your lab results to you on your MyChart account with further instructions. Please reply with any questions.   Please schedule an appointment with Dr. Corinna Capra for your female wellness exam and mammogram. It is overdue.  If you have any questions or concerns, please don't hesitate to send me a message via MyChart or call the office at [phone]. Thank you for visiting with [place] today! It's our pleasure caring for you.

## 2019-05-23 NOTE — Progress Notes (Signed)
Please call patient: I have reviewed his/her lab results. Please ask her if she was fasting or not at her visit yesterday.  If fasting , then ask lab to add hgba1c for hyperglycemia. If not fasting then sugars are ok.  Other labs are normal. Thanks.

## 2019-06-02 ENCOUNTER — Encounter: Payer: Self-pay | Admitting: Dermatology

## 2019-06-05 ENCOUNTER — Other Ambulatory Visit: Payer: Self-pay

## 2019-06-05 ENCOUNTER — Encounter: Payer: Self-pay | Admitting: Family Medicine

## 2019-06-05 ENCOUNTER — Telehealth: Payer: Self-pay | Admitting: Family Medicine

## 2019-06-05 MED ORDER — ESTRADIOL 2 MG PO TABS: ORAL_TABLET | ORAL | 0 refills | Status: DC

## 2019-06-05 MED ORDER — ESTRADIOL 2 MG PO TABS: ORAL_TABLET | ORAL | 0 refills | Status: AC

## 2019-06-05 NOTE — Telephone Encounter (Signed)
Diane Alexander is stating they received the estradiol (ESTRACE) 2 MG tablet but there is no directions on how often patient needs to take it.

## 2019-06-05 NOTE — Telephone Encounter (Signed)
[facility] [facility], [address], states that it was too expensive anywhere else

## 2019-06-05 NOTE — Telephone Encounter (Signed)
Refill sent to [facility] with correct directions

## 2019-06-05 NOTE — Telephone Encounter (Signed)
  LAST APPOINTMENT DATE: 05/22/2019   NEXT APPOINTMENT DATE:@11 /19/2021  MEDICATION:estradiol (ESTRACE) 2 MG tablet  PHARMACY:[facility] - [place], [place] - [address]  COMMENT: States Dr.Andy was supposed to send some in, but the pharmacy has yet to receive  It,   **Let patient know to contact pharmacy at the end of the day to make sure medication is ready. **  ** Please notify patient to allow 48-72 hours to process**  **Encourage patient to contact the pharmacy for refills or they can request refills through [facility]**  CLINICAL FILLS OUT ALL BELOW:   LAST REFILL:  QTY:  REFILL DATE:    OTHER COMMENTS:    Okay for refill?  Please advise

## 2019-07-24 ENCOUNTER — Encounter: Payer: Self-pay | Admitting: Family Medicine

## 2019-07-24 ENCOUNTER — Other Ambulatory Visit: Payer: Self-pay

## 2019-07-24 DIAGNOSIS — I10 Essential (primary) hypertension: Secondary | ICD-10-CM

## 2019-07-24 MED ORDER — AMLODIPINE BESYLATE 5 MG PO TABS: 5.0000 mg | ORAL_TABLET | Freq: Every day | ORAL | 3 refills | Status: DC

## 2019-07-25 ENCOUNTER — Other Ambulatory Visit: Payer: Self-pay

## 2019-07-25 ENCOUNTER — Ambulatory Visit (INDEPENDENT_AMBULATORY_CARE_PROVIDER_SITE_OTHER): Payer: PRIVATE HEALTH INSURANCE | Admitting: Family Medicine

## 2019-07-25 ENCOUNTER — Encounter: Payer: Self-pay | Admitting: Family Medicine

## 2019-07-25 VITALS — BP 113/78 | HR 89 | Temp 97.7°F | Ht 64.0 in | Wt 205.0 lb

## 2019-07-25 DIAGNOSIS — I10 Essential (primary) hypertension: Secondary | ICD-10-CM

## 2019-07-25 DIAGNOSIS — M65312 Trigger thumb, left thumb: Secondary | ICD-10-CM | POA: Diagnosis not present

## 2019-07-25 DIAGNOSIS — E890 Postprocedural hypothyroidism: Secondary | ICD-10-CM | POA: Diagnosis not present

## 2019-07-25 LAB — TSH: TSH: 0.85 m[IU]/L (ref 0.40–4.50)

## 2019-07-25 MED ORDER — METHYLPREDNISOLONE ACETATE 40 MG/ML IJ SUSP
40.0000 mg | Freq: Once | INTRAMUSCULAR | Status: AC
  Administered 2019-07-25: 20 mg via INTRAMUSCULAR

## 2019-07-25 NOTE — Patient Instructions (Signed)
It was very nice to see you today!  We injected your thumb today.  Please let us know if your symptoms are not improving.  We will check a thyroid level today.  Take care, Dr [name]  Please try these tips to maintain a healthy lifestyle:   Eat at least 3 REAL meals and 1-2 snacks per day.  Aim for no more than 5 hours between eating.  If you eat breakfast, please do so within one hour of getting up.    Each meal should contain half fruits/vegetables, one quarter protein, and one quarter carbs (no bigger than a computer mouse)   Cut down on sweet beverages. This includes juice, soda, and sweet tea.     Drink at least 1 glass of water with each meal and aim for at least 8 glasses per day   Exercise at least 150 minutes every week.

## 2019-07-25 NOTE — Addendum Note (Signed)
Addended by: Loralyn Freshwater on: 07/25/2019 02:46 PM   Modules accepted: Orders

## 2019-07-25 NOTE — Assessment & Plan Note (Signed)
At goal. Continue current medications. 

## 2019-07-25 NOTE — Progress Notes (Signed)
   Diane Alexander is a 59 y.o. female who presents today for an office visit.  Assessment/Plan:  New/Acute Problems: Trigger Finger Injection performed today.  See below procedure note.  She tolerated well.  If continues to have issues may need referral to hand surgeon.  Chronic Problems Addressed Today: Hypothyroidism following RAI 12/2016, on Levothyroxine Check TSH.  May need brand-name Synthroid as her pharmacy has frequently change manufacturer which will throw off her thyroid levels.   Essential hypertension At goal.  Continue current medications.     Subjective:  HPI:  Patient here with left trigger finger.  She had this about a year ago.  Had a cortisone shot which helped.  Symptoms returned over the last 2 weeks.  Has Alexander to the area.  Limited motion.  She also feels like her thyroid level is off.  She would like to have levels rechecked today.  She thinks that her drugstore changed her manufacturer.       Objective:  Physical Exam: BP 113/78   Pulse 89   Temp 97.7 F (36.5 C)   Ht 5\' 4"  (1.626 m)   Wt (!) 205 lb (93 kg)   LMP  (LMP Unknown)   SpO2 99%   BMI 35.19 kg/m   Gen: No acute distress, resting comfortably MSK: -Left hand: Trigger finger on left thumb. Neuro: Grossly normal, moves all extremities Psych: Normal affect and thought content  Procedure Note Informed consent was obtained for therapeutic injection of trigger finger.  Area was prepped with topical Betadine.  Topical ethyl chloride was applied for anesthesia.  A 23-gauge needle was then inserted to the point of maximal tenderness on flexor tendon and a solution of 0.5 mL of 1% lidocaine without epinephrine and 0.5 mL of Depo-Medrol 40 mg/ml was then injected into the area of maximal tenderness.  Needle was withdrawn.  Small bleeding was present.  Hemostasis was achieved with pressure.  Sterile bandage was placed.  Patient tolerated well without complication.      Algis Greenhouse. Diane Pain, MD 07/25/2019  12:06 PM

## 2019-07-25 NOTE — Assessment & Plan Note (Signed)
Check TSH.  May need brand-name Synthroid as her pharmacy has frequently change manufacturer which will throw off her thyroid levels.

## 2019-07-27 ENCOUNTER — Other Ambulatory Visit: Payer: Self-pay | Admitting: Family Medicine

## 2019-07-27 ENCOUNTER — Encounter: Payer: Self-pay | Admitting: Family Medicine

## 2019-07-27 DIAGNOSIS — I10 Essential (primary) hypertension: Secondary | ICD-10-CM

## 2019-07-27 DIAGNOSIS — E890 Postprocedural hypothyroidism: Secondary | ICD-10-CM

## 2019-07-28 ENCOUNTER — Other Ambulatory Visit: Payer: Self-pay

## 2019-07-28 DIAGNOSIS — I10 Essential (primary) hypertension: Secondary | ICD-10-CM

## 2019-07-28 MED ORDER — LOSARTAN POTASSIUM-HCTZ 100-25 MG PO TABS: 1.0000 | ORAL_TABLET | Freq: Every day | ORAL | 1 refills | Status: DC

## 2019-07-28 NOTE — Progress Notes (Signed)
Please inform patient of the following:  Her thyroid level is NORMAL.  Algis Greenhouse. [name], MD 07/28/2019 8:08 AM

## 2019-07-28 NOTE — Telephone Encounter (Signed)
Please review

## 2019-08-01 NOTE — Telephone Encounter (Signed)
Please refill her losartan-hctz if not already done

## 2019-08-04 ENCOUNTER — Encounter: Payer: Self-pay | Admitting: Family Medicine

## 2019-08-04 ENCOUNTER — Telehealth: Payer: Self-pay

## 2019-08-04 MED ORDER — LEVOTHYROXINE SODIUM 112 MCG PO TABS: 112.0000 ug | ORAL_TABLET | Freq: Every day | ORAL | 11 refills | Status: DC

## 2019-08-04 NOTE — Telephone Encounter (Signed)
Pt called requesting that the brand name Synthroid be sent to her pharmacy. She claims the prescription that was sent in this morning was generic. I informed pt I would send a message back. Pt states she would also send a message to Dr. Jonni Sanger and call office back within 15 minutes.

## 2019-08-04 NOTE — Telephone Encounter (Signed)
Patient sent MyChart message stating that pharmacy filled Synthroid for her.

## 2019-08-06 LAB — HEPATIC FUNCTION PANEL
ALT: 20 (ref 7–35)
AST: 21 (ref 13–35)
Alkaline Phosphatase: 69 (ref 25–125)
Bilirubin, Total: 0.3

## 2019-08-06 LAB — BASIC METABOLIC PANEL
BUN: 18 (ref 4–21)
CO2: 31 — AB (ref 13–22)
Chloride: 102 (ref 99–108)
Creatinine: 0.8 (ref 0.5–1.1)
Glucose: 187
Potassium: 4.6 (ref 3.4–5.3)
Sodium: 142 (ref 137–147)

## 2019-08-06 LAB — HEMOGLOBIN A1C: Hemoglobin A1C: 7.2

## 2019-08-06 LAB — COMPREHENSIVE METABOLIC PANEL WITH GFR
Albumin: 4.5 (ref 3.5–5.0)
Calcium: 9.5 (ref 8.7–10.7)
GFR calc Af Amer: 97
GFR calc non Af Amer: 85
Globulin: 2.1

## 2019-08-06 LAB — LIPID PANEL
Cholesterol: 191 (ref 0–200)
HDL: 78 — AB (ref 35–70)
LDL Cholesterol: 103
Triglycerides: 53 (ref 40–160)

## 2019-08-13 ENCOUNTER — Encounter: Payer: Self-pay | Admitting: Family Medicine

## 2019-08-16 ENCOUNTER — Encounter: Payer: Self-pay | Admitting: Family Medicine

## 2019-08-19 ENCOUNTER — Encounter: Payer: Self-pay | Admitting: Family Medicine

## 2019-08-19 ENCOUNTER — Other Ambulatory Visit: Payer: Self-pay

## 2019-08-19 ENCOUNTER — Ambulatory Visit (INDEPENDENT_AMBULATORY_CARE_PROVIDER_SITE_OTHER): Payer: PRIVATE HEALTH INSURANCE | Admitting: Family Medicine

## 2019-08-19 DIAGNOSIS — E739 Lactose intolerance, unspecified: Secondary | ICD-10-CM

## 2019-08-19 DIAGNOSIS — R899 Unspecified abnormal finding in specimens from other organs, systems and tissues: Secondary | ICD-10-CM

## 2019-08-19 DIAGNOSIS — E669 Obesity, unspecified: Secondary | ICD-10-CM | POA: Diagnosis not present

## 2019-08-19 LAB — POCT GLYCOSYLATED HEMOGLOBIN (HGB A1C): Hemoglobin A1C: 5.2 % (ref 4.0–5.6)

## 2019-08-19 NOTE — Progress Notes (Signed)
Subjective  CC:  Chief Complaint  Patient presents with  . Follow-up    possible diabetes from previous bloodwork abstracted     HPI: Diane Alexander is a 59 y.o. female who presents to the office today to address the problems listed above in the chief complaint. Diane Alexander presents due to some confusion regarding recently abstracted lab work.  She reports that she has not had any labs done at any other outside office recently.  Unfortunately, the hard copies have been sent to be scanned into the chart and I cannot look at them since they have not been scanned yet.  The abstractive data did show an A1c of 7.2.  Patient denies symptoms of diabetes. Lab Results  Component Value Date   HGBA1C 5.2 08/19/2019   HGBA1C 7.2 08/06/2019   HGBA1C 5.5 05/08/2018    todays a1c was 5.2  Office Visit on 08/19/2019  Component Date Value Ref Range Status  . Hemoglobin A1C 08/19/2019 5.2  4.0 - 5.6 % Final     Assessment  1. Abnormal laboratory test result   2. Lactose intolerance   3. Obesity (BMI 30-39.9)      Plan   Abnormal lab result: Reassured.  Today's A1c was completely normal.  We will need to get hard copies of labs and see if there has been an error.  Patient understands.  Lactose intolerant and obesity and constipation.  All discussed.  Nutrition counseling given.  Patient to work on weight loss.  Follow up: Follow-up as scheduled 11/21/2019  Orders Placed This Encounter  Procedures  . POCT glycosylated hemoglobin (Hb A1C)   No orders of the defined types were placed in this encounter.     I reviewed the patients updated PMH, FH, and SocHx.    Patient Active Problem List   Diagnosis Date Noted  . Hormone replacement therapy (HRT) 11/22/2018    Priority: High  . Hypothyroidism following RAI 12/2016, on Levothyroxine 05/20/2018    Priority: High  . Essential hypertension 02/02/2017    Priority: High  . Bilateral primary osteoarthritis of hip 05/22/2019    Priority:  Medium  . Chronic idiopathic constipation 05/23/2018    Priority: Medium  . Diverticulosis 05/23/2018    Priority: Medium  . OA (osteoarthritis) of knee, bilateral 10/31/2017    Priority: Medium  . Vasomotor symptoms due to menopause 10/31/2017    Priority: Medium  . Obesity (BMI 30-39.9) 02/02/2017    Priority: Medium  . Adjustment insomnia 02/02/2017    Priority: Medium  . Plantar fascia syndrome 02/09/2018    Priority: Low  . Seasonal allergic rhinitis due to pollen 02/02/2017    Priority: Low  . Arthralgia of multiple joints, with negative Rheum panel 02/02/2017    Priority: Low  . Vitamin D deficiency, taking 2000 IU daily 02/02/2017    Priority: Low  . Lactose intolerance 02/02/2017    Priority: Low  . Elevated LFTs, worked up by GI, essentially normal, thought to be due to mobic 05/23/2018   Current Meds  Medication Sig  . amLODipine (NORVASC) 5 MG tablet Take 1 tablet (5 mg total) by mouth daily.  . calcium carbonate (TUMS - DOSED IN MG ELEMENTAL CALCIUM) 500 MG chewable tablet Chew 1 tablet by mouth as needed.   . calcium-vitamin D (OSCAL WITH D) 500-200 MG-UNIT tablet Take 1 tablet by mouth.  . diclofenac sodium (VOLTAREN) 1 % GEL Apply 2 g topically 4 (four) times daily.  Marland Kitchen estradiol (ESTRACE) 2 MG tablet  Take one 2 mg tablet by mouth daily  . ibuprofen (ADVIL) 200 MG tablet Take 2 tablets (400 mg total) by mouth daily.  . Lactase (LACTAID PO) Take by mouth.  . levothyroxine (SYNTHROID) 112 MCG tablet Take 1 tablet (112 mcg total) by mouth daily before breakfast.  . linaclotide (LINZESS) 72 MCG capsule Take 1 capsule (72 mcg total) by mouth daily before breakfast.  . losartan-hydrochlorothiazide (HYZAAR) 100-25 MG tablet Take 1 tablet by mouth daily.  . pantoprazole (PROTONIX) 40 MG tablet Take 1 tablet (40 mg total) by mouth daily.  . phentermine (ADIPEX-P) 37.5 MG tablet 1/2 po daily  . polyethylene glycol (MIRALAX / GLYCOLAX) packet Take 17 g by mouth as needed.       Allergies: Patient has No Known Allergies. Family History: Patient family history includes Cancer in her maternal grandmother; Heart attack in her father; Heart disease in her mother; Lymphoma (age of onset: 75) in her sister. Social History:  Patient  reports that she has never smoked. She has never used smokeless tobacco. She reports that she does not drink alcohol and does not use drugs.  Review of Systems: Constitutional: Negative for fever malaise or anorexia Cardiovascular: negative for chest pain Respiratory: negative for SOB or persistent cough Gastrointestinal: negative for abdominal pain  Objective  Vitals: LMP  (LMP Unknown)  General: no acute distress , A&Ox3   Commons side effects, risks, benefits, and alternatives for medications and treatment plan prescribed today were discussed, and the patient expressed understanding of the given instructions. Patient is instructed to call or message via MyChart if he/she has any questions or concerns regarding our treatment plan. No barriers to understanding were identified. We discussed Red Flag symptoms and signs in detail. Patient expressed understanding regarding what to do in case of urgent or emergency type symptoms.   Medication list was reconciled, printed and provided to the patient in AVS. Patient instructions and summary information was reviewed with the patient as documented in the AVS. This note was prepared with assistance of Dragon voice recognition software. Occasional wrong-word or sound-a-like substitutions may have occurred due to the inherent limitations of voice recognition software  This visit occurred during the SARS-CoV-2 public health emergency.  Safety protocols were in place, including screening questions prior to the visit, additional usage of staff PPE, and extensive cleaning of exam room while observing appropriate contact time as indicated for disinfecting solutions.

## 2019-08-20 ENCOUNTER — Encounter: Payer: Self-pay | Admitting: Family Medicine

## 2019-08-20 NOTE — Telephone Encounter (Signed)
I have left patient vm to call me back in regard.

## 2019-09-30 ENCOUNTER — Telehealth: Payer: Self-pay

## 2019-09-30 NOTE — Telephone Encounter (Signed)
error 

## 2019-10-01 ENCOUNTER — Ambulatory Visit: Payer: PRIVATE HEALTH INSURANCE | Admitting: Family Medicine

## 2019-10-02 ENCOUNTER — Other Ambulatory Visit: Payer: PRIVATE HEALTH INSURANCE

## 2019-11-21 ENCOUNTER — Ambulatory Visit: Payer: PRIVATE HEALTH INSURANCE | Admitting: Family Medicine

## 2019-11-24 ENCOUNTER — Telehealth: Payer: Self-pay

## 2019-11-24 ENCOUNTER — Other Ambulatory Visit: Payer: Self-pay

## 2019-11-24 NOTE — Telephone Encounter (Signed)
MEDICATION: linaclotide (LINZESS) 72 MCG capsule   PHARMACY: [id number] - [place], [place] - [address]  Comments:   **Let patient know to contact pharmacy at the end of the day to make sure medication is ready. **  ** Please notify patient to allow 48-72 hours to process**  **Encourage patient to contact the pharmacy for refills or they can request refills through [facility]**

## 2019-11-24 NOTE — Telephone Encounter (Signed)
Yes, 90 day supply, 3 rf. thanks

## 2019-11-24 NOTE — Telephone Encounter (Signed)
OK to send in? You are not original prescribing provider

## 2019-11-25 ENCOUNTER — Other Ambulatory Visit: Payer: Self-pay

## 2019-11-25 MED ORDER — LINACLOTIDE 72 MCG PO CAPS: 72.0000 ug | ORAL_CAPSULE | Freq: Every day | ORAL | 3 refills | Status: DC

## 2019-11-25 NOTE — Telephone Encounter (Signed)
Year script sent to pharmacy

## 2020-01-05 ENCOUNTER — Ambulatory Visit: Payer: PRIVATE HEALTH INSURANCE

## 2020-01-12 ENCOUNTER — Ambulatory Visit: Payer: PRIVATE HEALTH INSURANCE | Admitting: Family Medicine

## 2020-01-16 ENCOUNTER — Other Ambulatory Visit: Payer: Self-pay | Admitting: Family Medicine

## 2020-01-16 DIAGNOSIS — I10 Essential (primary) hypertension: Secondary | ICD-10-CM

## 2020-02-09 ENCOUNTER — Encounter: Payer: Self-pay | Admitting: Family Medicine

## 2020-02-09 ENCOUNTER — Other Ambulatory Visit: Payer: Self-pay

## 2020-02-09 ENCOUNTER — Ambulatory Visit (INDEPENDENT_AMBULATORY_CARE_PROVIDER_SITE_OTHER): Payer: BLUE CROSS/BLUE SHIELD | Admitting: Family Medicine

## 2020-02-09 VITALS — BP 126/78 | HR 87 | Temp 97.5°F | Ht 64.0 in | Wt 213.4 lb

## 2020-02-09 DIAGNOSIS — I10 Essential (primary) hypertension: Secondary | ICD-10-CM | POA: Diagnosis not present

## 2020-02-09 DIAGNOSIS — E669 Obesity, unspecified: Secondary | ICD-10-CM

## 2020-02-09 DIAGNOSIS — E890 Postprocedural hypothyroidism: Secondary | ICD-10-CM | POA: Diagnosis not present

## 2020-02-09 DIAGNOSIS — R600 Localized edema: Secondary | ICD-10-CM | POA: Diagnosis not present

## 2020-02-09 MED ORDER — FUROSEMIDE 20 MG PO TABS: 20.0000 mg | ORAL_TABLET | Freq: Every day | ORAL | 2 refills | Status: AC | PRN

## 2020-02-09 NOTE — Progress Notes (Signed)
Subjective  CC:  Chief Complaint  Patient presents with  . Hypertension  . Weight Gain  . water retention  . Medication Problem    She wants to change her synthroid medication     HPI: Diane Alexander is a 60 y.o. female who presents to the office today to address the problems listed above in the chief complaint.  Hypertension f/u: Control is good . Pt reports she is doing well. taking medications as instructed, no medication side effects noted, no TIAs, no chest pain on exertion, no dyspnea on exertion.  She complains of intermittent lower extremity swelling.  This may be related to sodium intake.  She has used Lasix by her prior PCP intermittently in the past.  She finds to be helpful.  She has no swelling today.  Denies adverse effects from his BP medications. Compliance with medication is good.   Obesity: This continues to be a struggle for her.  She has gained weight since her last visit.  She bakes often but does not feel that she eats too much.  She has been to a nutritionist in the past.  She is not interested in doing that again.  She has used phentermine but this does not seem to be a [place]-term solution for her.  She rarely exercises.  Hypothyroidism: Change to brand name Synthroid in September or November.  Wondering if it is doing as well.  Sometimes she does not feel well will skip a dose and then feels better.  She denies fatigue.  Assessment  1. Essential hypertension   2. Hypothyroidism following RAI 12/2016, on Levothyroxine   3. Obesity (BMI 30-39.9)   4. Leg edema      Plan    Hypertension f/u: BP control is well controlled.  Continue amlodipine 5 mg daily.  Monitor ankle swelling.  None today on exam.  Check renal function and electrolytes.  Leg edema, intermittent: Discussed low-sodium diet.  Drinking plenty of water.  May use Lasix 20 mg as needed but I recommend this to be used infrequently.  Hypothyroidism f/u: Has been well controlled over the last year or  2.  We will recheck again today.  Continue brand-name.  Education given.  Obesity: Discussed exercise and diet recommendations. Education regarding management of these chronic disease states was given. Management strategies discussed on successive visits include dietary and exercise recommendations, goals of achieving and maintaining IBW, and lifestyle modifications aiming for adequate sleep and minimizing stressors.   Follow up: May for complete physical  Orders Placed This Encounter  Procedures  . TSH  . Hemoglobin A1c  . Basic metabolic panel   Meds ordered this encounter  Medications  . furosemide (LASIX) 20 MG tablet    Sig: Take 1 tablet (20 mg total) by mouth daily as needed for edema.    Dispense:  30 tablet    Refill:  2      BP Readings from Last 3 Encounters:  02/09/20 126/78  07/25/19 113/78  05/22/19 128/64   Wt Readings from Last 3 Encounters:  02/09/20 213 lb 6.4 oz (96.8 kg)  07/25/19 (!) 205 lb (93 kg)  05/22/19 204 lb 12.8 oz (92.9 kg)    Lab Results  Component Value Date   CHOL 191 08/06/2019   CHOL 163 04/09/2019   Lab Results  Component Value Date   HDL 78 (A) 08/06/2019   HDL 59.80 04/09/2019   Lab Results  Component Value Date   LDLCALC 103 08/06/2019  LDLCALC 73 04/09/2019   Lab Results  Component Value Date   TRIG 53 08/06/2019   TRIG 151.0 (H) 04/09/2019   Lab Results  Component Value Date   CHOLHDL 3 04/09/2019   No results found for: LDLDIRECT Lab Results  Component Value Date   CREATININE 0.8 08/06/2019   BUN 18 08/06/2019   NA 142 08/06/2019   K 4.6 08/06/2019   CL 102 08/06/2019   CO2 31 (A) 08/06/2019    The 10-year ASCVD risk score Mikey Bussing DC Jr., et al., 2013) is: 5.1%*   Values used to calculate the score:     Age: 51 years     Sex: Female     Is Non-Hispanic African American: Yes     Diabetic: No     Tobacco smoker: No     Systolic Blood Pressure: 361 mmHg     Is BP treated: Yes     HDL Cholesterol: 78  mg/dL*     Total Cholesterol: 191 mg/dL*     * - Cholesterol units were assumed for this score calculation  I reviewed the patients updated PMH, FH, and SocHx.    Patient Active Problem List   Diagnosis Date Noted  . Hormone replacement therapy (HRT) 11/22/2018    Priority: High  . Hypothyroidism following RAI 12/2016, on Levothyroxine 05/20/2018    Priority: High  . Essential hypertension 02/02/2017    Priority: High  . Bilateral primary osteoarthritis of hip 05/22/2019    Priority: Medium  . Chronic idiopathic constipation 05/23/2018    Priority: Medium  . Diverticulosis 05/23/2018    Priority: Medium  . OA (osteoarthritis) of knee, bilateral 10/31/2017    Priority: Medium  . Vasomotor symptoms due to menopause 10/31/2017    Priority: Medium  . Obesity (BMI 30-39.9) 02/02/2017    Priority: Medium  . Adjustment insomnia 02/02/2017    Priority: Medium  . Plantar fascia syndrome 02/09/2018    Priority: Low  . Seasonal allergic rhinitis due to pollen 02/02/2017    Priority: Low  . Arthralgia of multiple joints, with negative Rheum panel 02/02/2017    Priority: Low  . Vitamin D deficiency, taking 2000 IU daily 02/02/2017    Priority: Low  . Lactose intolerance 02/02/2017    Priority: Low  . Elevated LFTs, worked up by GI, essentially normal, thought to be due to mobic 05/23/2018    Allergies: Patient has no known allergies.  Social History: Patient  reports that she has never smoked. She has never used smokeless tobacco. She reports that she does not drink alcohol and does not use drugs.  Current Meds  Medication Sig  . amLODipine (NORVASC) 5 MG tablet Take 1 tablet (5 mg total) by mouth daily.  . calcium carbonate (TUMS - DOSED IN MG ELEMENTAL CALCIUM) 500 MG chewable tablet Chew 1 tablet by mouth as needed.   . diclofenac sodium (VOLTAREN) 1 % GEL Apply 2 g topically 4 (four) times daily.  Marland Kitchen estradiol (ESTRACE) 2 MG tablet Take one 2 mg tablet by mouth daily  .  furosemide (LASIX) 20 MG tablet Take 1 tablet (20 mg total) by mouth daily as needed for edema.  Marland Kitchen ibuprofen (ADVIL) 200 MG tablet Take 2 tablets (400 mg total) by mouth daily.  . Lactase (LACTAID PO) Take by mouth.  . levothyroxine (SYNTHROID) 112 MCG tablet Take 1 tablet (112 mcg total) by mouth daily before breakfast.  . linaclotide (LINZESS) 72 MCG capsule Take 1 capsule (72 mcg total) by mouth  daily before breakfast.  . losartan-hydrochlorothiazide (HYZAAR) 100-25 MG tablet TAKE ONE TABLET BY MOUTH ONE TIME DAILY  . pantoprazole (PROTONIX) 40 MG tablet Take 1 tablet (40 mg total) by mouth daily.  Marland Kitchen VITAMIN D PO 1 tablet    Review of Systems: Cardiovascular: negative for chest pain, palpitations, leg swelling, orthopnea Respiratory: negative for SOB, wheezing or persistent cough Gastrointestinal: negative for abdominal pain Genitourinary: negative for dysuria or gross hematuria  Objective  Vitals: BP 126/78   Pulse 87   Temp (!) 97.5 F (36.4 C) (Temporal)   Ht 5\' 4"  (1.626 m)   Wt 213 lb 6.4 oz (96.8 kg)   LMP  (LMP Unknown)   SpO2 98%   BMI 36.63 kg/m  General: no acute distress  Psych:  Alert and oriented, normal mood and affect HEENT:  Normocephalic, atraumatic, supple neck  Cardiovascular:  RRR without murmur. no edema Respiratory:  Good breath sounds bilaterally, CTAB with normal respiratory effort Skin:  Warm, no rashes Neurologic:   Mental status is normal  Commons side effects, risks, benefits, and alternatives for medications and treatment plan prescribed today were discussed, and the patient expressed understanding of the given instructions. Patient is instructed to call or message via MyChart if he/she has any questions or concerns regarding our treatment plan. No barriers to understanding were identified. We discussed Red Flag symptoms and signs in detail. Patient expressed understanding regarding what to do in case of urgent or emergency type symptoms.    Medication list was reconciled, printed and provided to the patient in AVS. Patient instructions and summary information was reviewed with the patient as documented in the AVS. This note was prepared with assistance of Dragon voice recognition software. Occasional wrong-word or sound-a-like substitutions may have occurred due to the inherent limitations of voice recognition software  This visit occurred during the SARS-CoV-2 public health emergency.  Safety protocols were in place, including screening questions prior to the visit, additional usage of staff PPE, and extensive cleaning of exam room while observing appropriate contact time as indicated for disinfecting solutions.

## 2020-02-09 NOTE — Patient Instructions (Addendum)
Please return in May 2022 for your annual complete physical; please come fasting.  I will release your lab results to you on your MyChart account with further instructions. Please reply with any questions.   If you have any questions or concerns, please don't hesitate to send me a message via MyChart or call the office at [phone]. Thank you for visiting with [place] today! It's our pleasure caring for you.

## 2020-02-10 LAB — HEMOGLOBIN A1C: Hgb A1c MFr Bld: 5.8 % (ref 4.6–6.5)

## 2020-02-10 LAB — BASIC METABOLIC PANEL
BUN: 16 mg/dL (ref 6–23)
CO2: 35 mEq/L — ABNORMAL HIGH (ref 19–32)
Calcium: 9.8 mg/dL (ref 8.4–10.5)
Chloride: 96 mEq/L (ref 96–112)
Creatinine, Ser: 0.81 mg/dL (ref 0.40–1.20)
GFR: 78.74 mL/min (ref >60.00)
Glucose, Bld: 91 mg/dL (ref 70–99)
Potassium: 3.1 mEq/L — ABNORMAL LOW (ref 3.5–5.1)
Sodium: 138 mEq/L (ref 135–145)

## 2020-02-10 LAB — TSH: TSH: 0.67 u[IU]/mL (ref 0.35–4.50)

## 2020-02-16 ENCOUNTER — Encounter: Payer: Self-pay | Admitting: Family Medicine

## 2020-02-17 NOTE — Telephone Encounter (Signed)
[facility] notified patient of her lab result patient voiced understanding. Please send in medication and order labs.

## 2020-02-19 ENCOUNTER — Other Ambulatory Visit: Payer: Self-pay

## 2020-02-19 MED ORDER — POTASSIUM CHLORIDE CRYS ER 20 MEQ PO TBCR: 20.0000 meq | EXTENDED_RELEASE_TABLET | Freq: Every day | ORAL | 3 refills | Status: DC

## 2020-02-27 ENCOUNTER — Telehealth: Payer: Self-pay

## 2020-02-27 NOTE — Telephone Encounter (Signed)
Shouldn't cause cramps;  Please schedule for lab visit on Monday for bmp.  OV if needed.

## 2020-02-27 NOTE — Telephone Encounter (Signed)
FYI

## 2020-02-27 NOTE — Telephone Encounter (Signed)
Patient called in stating she was prescribed potassium medicine but it has caused her to have cramps. Would like someone to call her back ASAP to discuss this further.

## 2020-03-01 ENCOUNTER — Other Ambulatory Visit: Payer: Self-pay

## 2020-03-01 ENCOUNTER — Other Ambulatory Visit (INDEPENDENT_AMBULATORY_CARE_PROVIDER_SITE_OTHER): Payer: BC Managed Care – PPO

## 2020-03-01 DIAGNOSIS — E876 Hypokalemia: Secondary | ICD-10-CM

## 2020-03-01 LAB — BASIC METABOLIC PANEL
BUN: 12 mg/dL (ref 6–23)
CO2: 30 mEq/L (ref 19–32)
Calcium: 10 mg/dL (ref 8.4–10.5)
Chloride: 98 mEq/L (ref 96–112)
Creatinine, Ser: 0.64 mg/dL (ref 0.40–1.20)
GFR: 95.83 mL/min (ref >60.00)
Glucose, Bld: 95 mg/dL (ref 70–99)
Potassium: 3.5 mEq/L (ref 3.5–5.1)
Sodium: 137 mEq/L (ref 135–145)

## 2020-03-01 NOTE — Telephone Encounter (Signed)
Lab appt scheduled for today. 

## 2020-03-04 NOTE — Progress Notes (Signed)
Please call patient: I have reviewed his/her lab results. Potassium is improved. Continue potassium daily with BP medications

## 2020-03-17 ENCOUNTER — Other Ambulatory Visit: Payer: Self-pay

## 2020-03-17 ENCOUNTER — Telehealth: Payer: Self-pay

## 2020-03-17 DIAGNOSIS — G8929 Other chronic pain: Secondary | ICD-10-CM

## 2020-03-17 DIAGNOSIS — M545 Low back pain, unspecified: Secondary | ICD-10-CM

## 2020-03-17 DIAGNOSIS — M25551 Pain in right hip: Secondary | ICD-10-CM

## 2020-03-17 NOTE — Telephone Encounter (Signed)
Yes please refer to orthopedics. Patient is due for complete physical in May.  Please ask her to schedule in May or June, next available.

## 2020-03-17 NOTE — Telephone Encounter (Signed)
LMOV[facility] advising patient that referral has been placed and to schedule CPE after 05/23/20

## 2020-03-17 NOTE — Telephone Encounter (Signed)
Patient c/o chronic lower back pain that is going into her legs and knees with a burning sensation. Last imaging results you recommended ortho referral for worsening arthritis of her hips. OK to place referral?

## 2020-03-17 NOTE — Telephone Encounter (Signed)
Patient is calling regarding her referral she states its referencing her hip and the pain is in her back, patient would just like clarity.  As well as patient is upset Dr. Jonni Sanger "did not give her any medical advice or help with her pain or give any medications " patient would like a call back regarding these matters

## 2020-03-18 ENCOUNTER — Encounter: Payer: Self-pay | Admitting: Family Medicine

## 2020-03-18 NOTE — Telephone Encounter (Signed)
When I placed the referral I put lower back pain and hip pain for dx in reference to the most recent xray from last year where it was noted changed it arthritic state in hips.

## 2020-03-18 NOTE — Telephone Encounter (Signed)
Please call patient and offer appt at next available appt. OK to use same day

## 2020-03-18 NOTE — Telephone Encounter (Signed)
Can offer her an appt with me to discuss again. Looks like we last discussed this in may 2021. As always, if problems persist, f/u visits are recommended. Thanks.

## 2020-03-19 ENCOUNTER — Ambulatory Visit: Payer: BC Managed Care – PPO | Admitting: Family Medicine

## 2020-03-19 ENCOUNTER — Other Ambulatory Visit: Payer: Self-pay

## 2020-03-19 ENCOUNTER — Encounter: Payer: Self-pay | Admitting: Family Medicine

## 2020-03-19 VITALS — BP 132/84 | Temp 97.3°F | Ht 64.0 in | Wt 216.8 lb

## 2020-03-19 DIAGNOSIS — N951 Menopausal and female climacteric states: Secondary | ICD-10-CM | POA: Diagnosis not present

## 2020-03-19 DIAGNOSIS — Z79890 Hormone replacement therapy (postmenopausal): Secondary | ICD-10-CM | POA: Diagnosis not present

## 2020-03-19 DIAGNOSIS — E890 Postprocedural hypothyroidism: Secondary | ICD-10-CM | POA: Diagnosis not present

## 2020-03-19 DIAGNOSIS — I10 Essential (primary) hypertension: Secondary | ICD-10-CM | POA: Diagnosis not present

## 2020-03-19 DIAGNOSIS — M51369 Other intervertebral disc degeneration, lumbar region without mention of lumbar back pain or lower extremity pain: Secondary | ICD-10-CM | POA: Insufficient documentation

## 2020-03-19 DIAGNOSIS — M5136 Other intervertebral disc degeneration, lumbar region: Secondary | ICD-10-CM | POA: Diagnosis not present

## 2020-03-19 DIAGNOSIS — F5102 Adjustment insomnia: Secondary | ICD-10-CM

## 2020-03-19 LAB — COMPREHENSIVE METABOLIC PANEL
ALT: 22 U/L (ref 0–35)
AST: 16 U/L (ref 0–37)
Albumin: 4 g/dL (ref 3.5–5.2)
Alkaline Phosphatase: 57 U/L (ref 39–117)
BUN: 16 mg/dL (ref 6–23)
CO2: 33 mEq/L — ABNORMAL HIGH (ref 19–32)
Calcium: 9.8 mg/dL (ref 8.4–10.5)
Chloride: 100 mEq/L (ref 96–112)
Creatinine, Ser: 0.76 mg/dL (ref 0.40–1.20)
GFR: 84.94 mL/min (ref >60.00)
Glucose, Bld: 118 mg/dL — ABNORMAL HIGH (ref 70–99)
Potassium: 3.8 mEq/L (ref 3.5–5.1)
Sodium: 140 mEq/L (ref 135–145)
Total Bilirubin: 0.5 mg/dL (ref 0.2–1.2)
Total Protein: 7.4 g/dL (ref 6.0–8.3)

## 2020-03-19 LAB — TSH: TSH: 0.78 u[IU]/mL (ref 0.35–4.50)

## 2020-03-19 LAB — SEDIMENTATION RATE: Sed Rate: 22 mm/hr (ref 0–30)

## 2020-03-19 MED ORDER — DICLOFENAC SODIUM 75 MG PO TBEC: 75.0000 mg | DELAYED_RELEASE_TABLET | Freq: Two times a day (BID) | ORAL | 0 refills | Status: DC

## 2020-03-19 NOTE — Patient Instructions (Signed)
Please return in 3 months for your annual complete physical; please come fasting.  I will release your lab results to you on your MyChart account with further instructions. Please reply with any questions.   If you have any questions or concerns, please don't hesitate to send me a message via MyChart or call the office at [phone]. Thank you for visiting with [place] today! It's our pleasure caring for you.   Acute Back Pain, Adult Acute back pain is sudden and usually short-lived. It is often caused by an injury to the muscles and tissues in the back. The injury may result from:  A muscle or ligament getting overstretched or torn (strained). Ligaments are tissues that connect bones to each other. Lifting something improperly can cause a back strain.  Wear and tear (degeneration) of the spinal disks. Spinal disks are circular tissue that provide cushioning between the bones of the spine (vertebrae).  Twisting motions, such as while playing sports or doing yard work.  A hit to the back.  Arthritis. You may have a physical exam, lab tests, and imaging tests to find the cause of your pain. Acute back pain usually goes away with rest and home care. Follow these instructions at home: Managing pain, stiffness, and swelling  Treatment may include medicines for pain and inflammation that are taken by mouth or applied to the skin, prescription pain medicine, or muscle relaxants. Take over-the-counter and prescription medicines only as told by your health care provider.  Your health care provider may recommend applying ice during the first 24-48 hours after your pain starts. To do this: ? Put ice in a plastic bag. ? Place a towel between your skin and the bag. ? Leave the ice on for 20 minutes, 2-3 times a day.  If directed, apply heat to the affected area as often as told by your health care provider. Use the heat source that your health care provider recommends, such as a moist heat pack or a  heating pad. ? Place a towel between your skin and the heat source. ? Leave the heat on for 20-30 minutes. ? Remove the heat if your skin turns bright red. This is especially important if you are unable to feel pain, heat, or cold. You have a greater risk of getting burned. Activity  Do not stay in bed. Staying in bed for more than 1-2 days can delay your recovery.  Sit up and stand up straight. Avoid leaning forward when you sit or hunching over when you stand. ? If you work at a desk, sit close to it so you do not need to lean over. Keep your chin tucked in. Keep your neck drawn back, and keep your elbows bent at a 90-degree angle (right angle). ? Sit high and close to the steering wheel when you drive. Add lower back (lumbar) support to your car seat, if needed.  Take short walks on even surfaces as soon as you are able. Try to increase the length of time you walk each day.  Do not sit, drive, or stand in one place for more than 30 minutes at a time. Sitting or standing for [place] periods of time can put stress on your back.  Do not drive or use heavy machinery while taking prescription pain medicine.  Use proper lifting techniques. When you bend and lift, use positions that put less stress on your back: ? [place] your knees. ? Keep the load close to your body. ? Avoid twisting.  Exercise  regularly as told by your health care provider. Exercising helps your back heal faster and helps prevent back injuries by keeping muscles strong and flexible.  Work with a physical therapist to make a safe exercise program, as recommended by your health care provider. Do any exercises as told by your physical therapist.   Lifestyle  Maintain a healthy weight. Extra weight puts stress on your back and makes it difficult to have good posture.  Avoid activities or situations that make you feel anxious or stressed. Stress and anxiety increase muscle tension and can make back pain worse. Learn ways to manage  anxiety and stress, such as through exercise. General instructions  Sleep on a firm mattress in a comfortable position. Try lying on your side with your knees slightly bent. If you lie on your back, put a pillow under your knees.  Follow your treatment plan as told by your health care provider. This may include: ? Cognitive or behavioral therapy. ? Acupuncture or massage therapy. ? Meditation or yoga. Contact a health care provider if:  You have pain that is not relieved with rest or medicine.  You have increasing pain going down into your legs or buttocks.  Your pain does not improve after 2 weeks.  You have pain at night.  You lose weight without trying.  You have a fever or chills. Get help right away if:  You develop new bowel or bladder control problems.  You have unusual weakness or numbness in your arms or legs.  You develop nausea or vomiting.  You develop abdominal pain.  You feel faint. Summary  Acute back pain is sudden and usually short-lived.  Use proper lifting techniques. When you bend and lift, use positions that put less stress on your back.  Take over-the-counter and prescription medicines and apply heat or ice as directed by your health care provider. This information is not intended to replace advice given to you by your health care provider. Make sure you discuss any questions you have with your health care provider. Document Revised: 09/12/2019 Document Reviewed: 09/12/2019 Elsevier Patient Education  2021 [employer].

## 2020-03-19 NOTE — Progress Notes (Signed)
Subjective  CC:  Chief Complaint  Patient presents with  . Back Pain    States that her TSH is off, will not go into anymore detail     HPI: Diane Alexander is a 60 y.o. female who presents to the office today to address the problems listed above in the chief complaint.  Having midline low back pain. Has had in the past but now active again over the last 2 weeks. No injury. She thinks it is related to her thryoid problems.  Denies hip pain or knee pain. Has DDD of lumbar spine by review of xrays from 2020. Has hip arthritis from recent xrays and knee OA. She took a few advil. Marland Kitchen   HTN: reviewed BP and meds. Denies cp. Has had hypokalemia on hctz which helps manage her mild leg edema. She was instructed to take oral potassium supplements but reports they made her nauseaous.   HRT, hot flushes and poor sleep. C/o feeling hot after she sleeps. Also with hot flushes and night sweats. On HRT> sees gyn.  Assessment  1. Hypothyroidism following RAI 12/2016, on Levothyroxine   2. Essential hypertension   3. Hormone replacement therapy (HRT)   4. DDD (degenerative disc disease), lumbar   5. Vasomotor symptoms due to menopause   6. Adjustment insomnia      Plan   hypothyroidism:  Will recheck levels per patients request. Has been well controlled with nl reading last month. Education given  HTN: good control however with hypokalemia in past. If remains low, will try a different potassium formulation to see if she can tolerate it. She has lasix but uses only on occasion. Is on amlodipine as well. She is hesitant to change meds  HRT for menopausal hot flushes: education given: she will discuss further mgt strategies with GYN. Continue estradiol.   DDD and low back pain: education given. Trial of voltaren for flare. No red flag sxs present. Check ESR  Insomnia: mood and hot flush related.  Today's visit was 40 minutes [place]. Greater than 50% of this time was devoted to face to face counseling  with the patient and coordination of care. We discussed her diagnosis, prognosis, treatment options and treatment plan is documented above.    Follow up: Return in about 3 months (around 06/19/2020) for complete physical.  Visit date not found  Orders Placed This Encounter  Procedures  . Comprehensive metabolic panel  . TSH  . Sedimentation rate   Meds ordered this encounter  Medications  . diclofenac (VOLTAREN) 75 MG EC tablet    Sig: Take 1 tablet (75 mg total) by mouth 2 (two) times daily.    Dispense:  30 tablet    Refill:  0      I reviewed the patients updated PMH, FH, and SocHx.    Patient Active Problem List   Diagnosis Date Noted  . Hormone replacement therapy (HRT) 11/22/2018    Priority: High  . Hypothyroidism following RAI 12/2016, on Levothyroxine 05/20/2018    Priority: High  . Essential hypertension 02/02/2017    Priority: High  . Bilateral primary osteoarthritis of hip 05/22/2019    Priority: Medium  . Chronic idiopathic constipation 05/23/2018    Priority: Medium  . Diverticulosis 05/23/2018    Priority: Medium  . OA (osteoarthritis) of knee, bilateral 10/31/2017    Priority: Medium  . Vasomotor symptoms due to menopause 10/31/2017    Priority: Medium  . Obesity (BMI 30-39.9) 02/02/2017    Priority: Medium  .  Adjustment insomnia 02/02/2017    Priority: Medium  . Plantar fascia syndrome 02/09/2018    Priority: Low  . Seasonal allergic rhinitis due to pollen 02/02/2017    Priority: Low  . Arthralgia of multiple joints, with negative Rheum panel 02/02/2017    Priority: Low  . Vitamin D deficiency, taking 2000 IU daily 02/02/2017    Priority: Low  . Lactose intolerance 02/02/2017    Priority: Low  . DDD (degenerative disc disease), lumbar 03/19/2020  . Elevated LFTs, worked up by GI, essentially normal, thought to be due to mobic 05/23/2018   Current Meds  Medication Sig  . amLODipine (NORVASC) 5 MG tablet Take 1 tablet (5 mg total) by mouth  daily.  . calcium carbonate (TUMS - DOSED IN MG ELEMENTAL CALCIUM) 500 MG chewable tablet Chew 1 tablet by mouth as needed.   . calcium-vitamin D (OSCAL WITH D) 500-200 MG-UNIT tablet Take 1 tablet by mouth.  . diclofenac (VOLTAREN) 75 MG EC tablet Take 1 tablet (75 mg total) by mouth 2 (two) times daily.  . diclofenac sodium (VOLTAREN) 1 % GEL Apply 2 g topically 4 (four) times daily.  Marland Kitchen estradiol (ESTRACE) 2 MG tablet Take one 2 mg tablet by mouth daily  . furosemide (LASIX) 20 MG tablet Take 1 tablet (20 mg total) by mouth daily as needed for edema.  . Lactase (LACTAID PO) Take by mouth.  . levothyroxine (SYNTHROID) 112 MCG tablet Take 1 tablet (112 mcg total) by mouth daily before breakfast.  . linaclotide (LINZESS) 72 MCG capsule Take 1 capsule (72 mcg total) by mouth daily before breakfast.  . losartan-hydrochlorothiazide (HYZAAR) 100-25 MG tablet TAKE ONE TABLET BY MOUTH ONE TIME DAILY  . pantoprazole (PROTONIX) 40 MG tablet Take 1 tablet (40 mg total) by mouth daily.  Marland Kitchen VITAMIN D PO 1 tablet  . [DISCONTINUED] ibuprofen (ADVIL) 200 MG tablet Take 2 tablets (400 mg total) by mouth daily.    Allergies: Patient has No Known Allergies. Family History: Patient family history includes Cancer in her maternal grandmother; Heart attack in her father; Heart disease in her mother; Lymphoma (age of onset: 15) in her sister. Social History:  Patient  reports that she has never smoked. She has never used smokeless tobacco. She reports that she does not drink alcohol and does not use drugs.  Review of Systems: Constitutional: Negative for fever malaise or anorexia Cardiovascular: negative for chest pain Respiratory: negative for SOB or persistent cough Gastrointestinal: negative for abdominal pain  Objective  Vitals: BP 132/84 (BP Location: Left Arm, Patient Position: Sitting, Cuff Size: Normal)   Temp (!) 97.3 F (36.3 C) (Temporal)   Ht $R'5\' 4"'ko$  (1.626 m)   Wt 216 lb 12.8 oz (98.3 kg)   LMP   (LMP Unknown)   BMI 37.21 kg/m  General: no acute distress , A&Ox3 HEENT: PEERL, conjunctiva normal, neck is supple Cardiovascular:  RRR without murmur or gallop.  Respiratory:  Good breath sounds bilaterally, CTAB with normal respiratory effort Skin:  Warm, no rashes Back: midline lower ttp, no paravertebral or hip or sciatic notch ttp. Full rom. Neg SLR bilateral. Nl gait.      Commons side effects, risks, benefits, and alternatives for medications and treatment plan prescribed today were discussed, and the patient expressed understanding of the given instructions. Patient is instructed to call or message via MyChart if he/she has any questions or concerns regarding our treatment plan. No barriers to understanding were identified. We discussed Red Flag symptoms and signs  in detail. Patient expressed understanding regarding what to do in case of urgent or emergency type symptoms.   Medication list was reconciled, printed and provided to the patient in AVS. Patient instructions and summary information was reviewed with the patient as documented in the AVS. This note was prepared with assistance of Dragon voice recognition software. Occasional wrong-word or sound-a-like substitutions may have occurred due to the inherent limitations of voice recognition software  This visit occurred during the SARS-CoV-2 public health emergency.  Safety protocols were in place, including screening questions prior to the visit, additional usage of staff PPE, and extensive cleaning of exam room while observing appropriate contact time as indicated for disinfecting solutions.

## 2020-03-30 ENCOUNTER — Ambulatory Visit: Payer: BC Managed Care – PPO | Admitting: Orthopaedic Surgery

## 2020-04-22 DIAGNOSIS — I10 Essential (primary) hypertension: Secondary | ICD-10-CM | POA: Diagnosis not present

## 2020-04-22 DIAGNOSIS — E890 Postprocedural hypothyroidism: Secondary | ICD-10-CM | POA: Diagnosis not present

## 2020-04-22 DIAGNOSIS — R7301 Impaired fasting glucose: Secondary | ICD-10-CM | POA: Diagnosis not present

## 2020-05-05 DIAGNOSIS — Z1231 Encounter for screening mammogram for malignant neoplasm of breast: Secondary | ICD-10-CM | POA: Diagnosis not present

## 2020-05-05 DIAGNOSIS — Z6836 Body mass index (BMI) 36.0-36.9, adult: Secondary | ICD-10-CM | POA: Diagnosis not present

## 2020-05-05 DIAGNOSIS — Z01419 Encounter for gynecological examination (general) (routine) without abnormal findings: Secondary | ICD-10-CM | POA: Diagnosis not present

## 2020-08-11 ENCOUNTER — Telehealth: Payer: Self-pay

## 2020-08-11 DIAGNOSIS — I10 Essential (primary) hypertension: Secondary | ICD-10-CM

## 2020-08-11 DIAGNOSIS — E890 Postprocedural hypothyroidism: Secondary | ICD-10-CM

## 2020-08-11 MED ORDER — LOSARTAN POTASSIUM-HCTZ 100-25 MG PO TABS
1.0000 | ORAL_TABLET | Freq: Every day | ORAL | 1 refills | Status: DC
Start: 2020-08-11 — End: 2021-03-07

## 2020-08-11 MED ORDER — LEVOTHYROXINE SODIUM 112 MCG PO TABS
112.0000 ug | ORAL_TABLET | Freq: Every day | ORAL | 5 refills | Status: DC
Start: 2020-08-11 — End: 2022-02-10

## 2020-08-11 MED ORDER — AMLODIPINE BESYLATE 5 MG PO TABS: 5.0000 mg | ORAL_TABLET | Freq: Every day | ORAL | 1 refills | Status: DC

## 2020-08-11 NOTE — Telephone Encounter (Signed)
  Encourage patient to contact the pharmacy for refills or they can request refills through [facility]:  03/19/2020  NEXT APPOINTMENT DATE:   MEDICATION: amLODipine (NORVASC) 5 MG tablet // levothyroxine (SYNTHROID) 112 MCG tablet// losartan-hydrochlorothiazide (HYZAAR) 100-25 MG tablet  PHARMACY: [id number] - [place], [place] - [place]  Let patient know to contact pharmacy at the end of the day to make sure medication is ready.  Please notify patient to allow 48-72 hours to process

## 2020-08-11 NOTE — Telephone Encounter (Signed)
Error

## 2020-08-11 NOTE — Telephone Encounter (Signed)
Rx's sent to pharmacy as requested. 

## 2020-08-16 ENCOUNTER — Encounter: Payer: Self-pay | Admitting: Family Medicine

## 2020-08-16 ENCOUNTER — Other Ambulatory Visit: Payer: Self-pay

## 2020-08-16 ENCOUNTER — Ambulatory Visit: Payer: Self-pay

## 2020-08-16 ENCOUNTER — Ambulatory Visit: Payer: BC Managed Care – PPO | Admitting: Family Medicine

## 2020-08-16 VITALS — BP 110/68 | HR 81 | Ht 64.0 in | Wt 221.2 lb

## 2020-08-16 DIAGNOSIS — M5416 Radiculopathy, lumbar region: Secondary | ICD-10-CM

## 2020-08-16 DIAGNOSIS — M7062 Trochanteric bursitis, left hip: Secondary | ICD-10-CM

## 2020-08-16 DIAGNOSIS — M25562 Pain in left knee: Secondary | ICD-10-CM

## 2020-08-16 DIAGNOSIS — M79662 Pain in left lower leg: Secondary | ICD-10-CM

## 2020-08-16 DIAGNOSIS — G8929 Other chronic pain: Secondary | ICD-10-CM

## 2020-08-16 MED ORDER — LORAZEPAM 0.5 MG PO TABS: ORAL_TABLET | ORAL | 0 refills | Status: DC

## 2020-08-16 NOTE — Patient Instructions (Addendum)
Thank you for coming in today.   I've referred you to Physical Therapy.  Let us know if you don't hear from them in one week.   You should hear from MRI scheduling within 1 week. If you do not hear please let me know.    Take the ativan prior to the MRI for anxiety.  You will need a driver.   Recheck after the MRI and PT.

## 2020-08-16 NOTE — Progress Notes (Signed)
I, Wendy Poet, LAT, ATC, am serving as scribe for Dr. Lynne Leader.  Subjective:    CC: L leg pain  HPI: Pt is a 60 y/o female c/o L lower leg, knee and thigh pain x months that worsened last week. Pt locates pain to her entire L leg from the L thigh to the L knee to the L lower leg .  She has been walking for exercise and weight loss but has done this previously w/ no issue in terms of her L leg.  Low back pain: yes Leg swelling: no Numbness/tingling: intermittently in her B feet, R>L L leg weakness: yes, feels like her leg goes dead Aggravating factors: transitioning from sit-to-stand; walking; after walking for exercise Treatments tried: topical and oral diclofenac; tylenol; Advil; ice bath;  Dx imaging: 05/22/19 Bilat hip XR  08/27/18 L knee & L-spine XR    Pertinent review of Systems: No fevers or chills  Relevant historical information: Hypertension.  Hypothyroidism.  History of knee osteoarthritis.   Objective:    Vitals:   08/16/20 1253  BP: 110/68  Pulse: 81  SpO2: 96%   General: Well Developed, well nourished, and in no acute distress.   MSK:  L-spine: Nontender midline decreased lumbar motion. Positive left-sided slump test. Lower extremity strength is intact except noted below.  Left hip normal. Normal motion. Tender palpation greater trochanter. Hip abduction and external rotation strength are decreased 4/5.  Left knee normal-appearing normal motion with crepitation.  Left calf normal-appearing nontender normal motion.  Lab and Radiology Results  EXAM: DG HIP (WITH OR WITHOUT PELVIS) 2V BILAT   COMPARISON:  None   FINDINGS: Osseous mineralization normal.   SI joint spaces preserved.   Narrowing of hip joints bilaterally slightly greater on RIGHT with RIGHT femoral head spur formation.   No acute fracture, dislocation, or bone destruction.   Small pelvic phleboliths.   IMPRESSION: Degenerative changes of BILATERAL hip joints  without acute abnormalities.     Electronically Signed   By: Lavonia Dana M.D.   On: 05/22/2019 10:06  I, Lynne Leader, personally (independently) visualized and performed the interpretation of the images attached in this note.   X-ray images left knee and L-spine images obtained August 27, 2018 personally independent interpreted today.  Left knee patellofemoral DJD.  L-spine.  DDD lower portion lumbar spine.    Impression and Recommendations:    Assessment and Plan: 59 y.o. female with left leg pain multifactorial.  Patient has pain predominantly located in the lateral hip associate with left hip abductor weakness thought to be related to hip abductor tendinopathy and greater trochanteric bursitis.  She is an excellent candidate for physical therapy for this.  Plan for referral to PT for hip abductor stretching and strengthening.  Additionally she has pain in the left knee that certainly could be lumbar radiculopathy but also related to patellofemoral DJD and quad weakness.  She is an excellent candidate for physical therapy as well for this.  Again referral to PT.  Consider steroid injection left knee if needed in the future.  Left lower leg pain thought to be L5 or L4 radiculopathy or perhaps both.  This could explain the rest of her leg pain as well and I think is one of the dominant issues today.  She does have an x-ray from about 2 years ago that does show some degenerative changes in the lower portion lumbar spine that is characteristic for her symptoms explained today.  At this point she  is had a lot of treatment trials including trials of systemic steroids that have helped temporarily.  Plan for MRI.  This should help characterize cause of pain.  Additionally should help determine further treatment including epidural steroid injection.  Would like to avoid systemic steroids if possible.  Recheck after MRI.  She mentions MRI claustrophobia.  Lorazepam prescribed.Marland Kitchen  PDMP not  reviewed this encounter. Orders Placed This Encounter  Procedures   MR Lumbar Spine Wo Contrast    Standing Status:   Future    Standing Expiration Date:   08/16/2021    Order Specific Question:   What is the patient's sedation requirement?    Answer:   No Sedation    Order Specific Question:   Does the patient have a pacemaker or implanted devices?    Answer:   No    Order Specific Question:   Preferred imaging location?    Answer:   [facility] (table limit-550lbs)   Ambulatory referral to Physical Therapy    Referral Priority:   Routine    Referral Type:   Physical Medicine    Referral Reason:   Specialty Services Required    Requested Specialty:   Physical Therapy    Number of Visits Requested:   1   Meds ordered this encounter  Medications   LORazepam (ATIVAN) 0.5 MG tablet    Sig: 1-2 tabs 30 - 60 min prior to MRI. Do not drive with this medicine.    Dispense:  4 tablet    Refill:  0    Discussed warning signs or symptoms. Please see discharge instructions. Patient expresses understanding.   The above documentation has been reviewed and is accurate and complete Lynne Leader, M.D.

## 2020-08-23 ENCOUNTER — Encounter: Payer: Self-pay | Admitting: Rehabilitative and Restorative Service Providers"

## 2020-08-23 ENCOUNTER — Ambulatory Visit (INDEPENDENT_AMBULATORY_CARE_PROVIDER_SITE_OTHER): Payer: BC Managed Care – PPO | Admitting: Rehabilitative and Restorative Service Providers"

## 2020-08-23 ENCOUNTER — Other Ambulatory Visit: Payer: Self-pay

## 2020-08-23 DIAGNOSIS — M5442 Lumbago with sciatica, left side: Secondary | ICD-10-CM | POA: Diagnosis not present

## 2020-08-23 DIAGNOSIS — M25562 Pain in left knee: Secondary | ICD-10-CM | POA: Diagnosis not present

## 2020-08-23 DIAGNOSIS — M6281 Muscle weakness (generalized): Secondary | ICD-10-CM | POA: Diagnosis not present

## 2020-08-23 DIAGNOSIS — M79605 Pain in left leg: Secondary | ICD-10-CM

## 2020-08-23 DIAGNOSIS — G8929 Other chronic pain: Secondary | ICD-10-CM

## 2020-08-23 DIAGNOSIS — R262 Difficulty in walking, not elsewhere classified: Secondary | ICD-10-CM

## 2020-08-23 NOTE — Therapy (Signed)
[facility] [address] [place], [place], [address] Phone: [phone]   Fax:  [phone]  Physical Therapy Evaluation  Patient Details  Name: Diane Alexander MRN: [id number] Date of Birth: 09-09-1960 Referring Provider (PT): Dr. Lynne Leader   Encounter Date: 08/23/2020   PT End of Session - 08/23/20 1017     Visit Number 1    Number of Visits 20    Date for PT Re-Evaluation 11/01/20    Authorization Type [employer] 40 visits per year    Progress Note Due on Visit 10    PT Start Time 1020    PT Stop Time 1100    PT Time Calculation (min) 40 min    Activity Tolerance Patient tolerated treatment well    Behavior During Therapy [facility] for tasks assessed/performed             Past Medical History:  Diagnosis Date   Allergy    Bilateral primary osteoarthritis of hip 05/22/2019   Constipation    Lumbar arthropathy     Past Surgical History:  Procedure Laterality Date   BREAST CYST EXCISION     CHOLECYSTECTOMY     VAGINAL HYSTERECTOMY      There were no vitals filed for this visit.    Subjective Assessment - 08/23/20 1022     Subjective Pt. indicated she has been suffering from Lt side of body for years.  Pt. indicated she was told she had arthritis in Lt hip and Lt knee as well as lumbar decreased spacing.  Pt. stated she also has been diagnosised c IT band symptoms.  Pt. indicated she likes to walk and usually have more pain with walking in Lt leg and knees as well. Pt. indicated medications have not helped as much and still hurts.  Pt. stated she wants to walk but it hurts.  Pt. stated chief complaint of leg pain on Lt but does have back pain as well.    Limitations Walking;Standing;House hold activities    How [place] can you sit comfortably? "for hours"    How Lieber can you walk comfortably? 15 mins    Diagnostic tests MRI to be performed on back.  History of xrays on hip/knees with degenerative changes.    Patient Stated Goals Reduce pain,  walk better    Currently in Pain? Yes    Pain Score 1    8/10 at worst in last week   Pain Location Leg   hip/knee   Pain Orientation Left    Pain Descriptors / Indicators Aching   charlie horse feeling   Pain Type Chronic pain    Pain Onset More than a month ago    Pain Frequency Intermittent    Aggravating Factors  standing, walking, stairs    Pain Relieving Factors massage, trying to rest, hot, ice at times    Effect of Pain on Daily Activities Limited in walking and stair navigation.                [facility] PT Assessment - 08/23/20 0001       Assessment   Medical Diagnosis M25.562,G89.29 (ICD-10-CM) - Chronic pain of left knee  M79.662 (ICD-10-CM) - Pain of left calf  M54.16 (ICD-10-CM) - Lumbar radiculopathy    Referring Provider (PT) Dr. Lynne Leader    Onset Date/Surgical Date 08/11/18    Hand Dominance Right      Precautions   Precautions None      Balance Screen   Has the  patient fallen in the past 6 months Yes    How many times? 1    Has the patient had a decrease in activity level because of a fear of falling?  No    Is the patient reluctant to leave their home because of a fear of falling?  No      Home Environment   Additional Comments stairs in house to get to bedroom (16-20 stairs) c rail on Lt going up.      Prior Function   Vocation Part time employment    Vocation Requirements sitting work    Leisure Walking, cooking/baking, detail car, boardgames, working with plants      Observation/Other Assessments   Focus on Therapeutic Outcomes (FOTO)  intake 42%, predicted 50%      Sensation   Light Touch Appears Intact      Posture/Postural Control   Posture Comments Rt lateral shift, increased lumbar lordosis      AROM   Overall AROM Comments Plan to check hip AROM next visits    AROM Assessment Site Lumbar;Hip;Knee    Right/Left Hip Left;Right    Right/Left Knee Right;Left    Lumbar Flexion movement to mid shin no complaint change    Lumbar Extension  50 % WFL, no complaint change      Strength   Strength Assessment Site Hip;Knee;Ankle    Right/Left Hip Left;Right    Right Hip Flexion 5/5    Right Hip ABduction 4+/5    Left Hip Flexion 4/5    Left Hip ABduction 3+/5    Right/Left Knee Left;Right    Right Knee Flexion 5/5    Right Knee Extension 5/5    Left Knee Flexion 5/5    Left Knee Extension 4/5    Right/Left Ankle Right;Left    Right Ankle Dorsiflexion 5/5    Left Ankle Dorsiflexion 5/5      Palpation   Palpation comment Trigger points in Lt glute med/min/max, lateral quad, IT band tenderness noted      Transfers   Comments Able to perform 18 inch chair s UE assist      Ambulation/Gait   Gait Comments Reduce stance on Lt leg                        Objective measurements completed on examination: See above findings.       [facility] PT Treatment/Exercise - 08/23/20 0001       Exercises   Exercises Other Exercises    Other Exercises  HEP instruction/performance c cues for techniques, handout provided.  Trial set performed of each for comprehension and symptom assessment.  HEP consisting of lateral shift correction at wall (trunk movement to Lt), supine bridge, sidelying clam shell.      Manual Therapy   Manual therapy comments Lateral shift correction(trunk to Lt)                    PT Education - 08/23/20 1017     Education Details HEP, POC    Person(s) Educated Patient    Methods Explanation;Demonstration;Verbal cues;Handout    Comprehension Verbalized understanding;Returned demonstration              PT Short Term Goals - 08/23/20 1017       PT SHORT TERM GOAL #1   Title Patient will demonstrate independent use of home exercise program to maintain progress from in clinic treatments.    Time 3  Period Weeks    Status New    Target Date 09/13/20               PT Canepa Term Goals - 08/23/20 1016       PT Karner TERM GOAL #1   Title Patient will  demonstrate/report pain at worst less than or equal to 2/10 to facilitate minimal limitation in daily activity secondary to pain symptoms.    Time 10    Period Weeks    Status New    Target Date 11/01/20      PT Mascaro TERM GOAL #2   Title Patient will demonstrate independent use of home exercise program to facilitate ability to maintain/progress functional gains from skilled physical therapy services.    Time 10    Period Weeks    Status New    Target Date 11/01/20      PT Branan TERM GOAL #3   Title Pt. will demonstrate FOTO outcome > or =50% to indicated reduced disability due to condition.    Time 10    Period Weeks    Status New    Target Date 11/01/20      PT Bonenfant TERM GOAL #4   Title Patient will demonstrate lumbar extension 100 % WFL s symptoms to facilitate upright standing, walking posture at PLOF s limitation.    Time 10    Period Weeks    Status New    Target Date 11/01/20      PT Goodnow TERM GOAL #5   Title Pt. will demonstrate Lt leg strength equal to Rt at 5/5 to facilitate usual walking, stairs and daily activity at PLOF.    Time 10    Period Weeks    Status New    Target Date 11/01/20      Additional Chow Term Goals   Additional Creps Term Goals Yes      PT Shelnutt TERM GOAL #6   Title Pt. will demonstrate reciprocal gait pattern on stairs c one hand rail a flight of stairs for household navigation.    Time 10    Period Weeks    Status New    Target Date 11/01/20                    Plan - 08/23/20 1019     Clinical Impression Statement Patient is a 60 y.o. who comes to clinic with complaints of low back pain c Lt leg/hip pain with mobility, strength and movement coordination deficits that impair their ability to perform usual daily and recreational functional activities without increase difficulty/symptoms at this time.  Patient to benefit from skilled PT services to address impairments and limitations to improve to previous level of function without  restriction secondary to condition.    Personal Factors and Comorbidities Comorbidity 1    Comorbidities hip OA    Examination-Activity Limitations Stand;Stairs;Squat;Bend;Transfers;Locomotion Level;Lift    Examination-Participation Restrictions Community Activity;Meal Prep;Shop    Stability/Clinical Decision Making Evolving/Moderate complexity    Clinical Decision Making Moderate    Rehab Potential --   fair to good   PT Frequency Other (comment)   1-2x/week   PT Duration Other (comment)   10 weeks   PT Treatment/Interventions ADLs/Self Care Home Management;Cryotherapy;Electrical Stimulation;Iontophoresis '4mg'$ /ml Dexamethasone;Moist Heat;Traction;Ultrasound;Functional mobility training;Stair training;Therapeutic activities;Gait training;Therapeutic exercise;Neuromuscular re-education;Manual techniques;Patient/family education;Taping;Passive range of motion;Spinal Manipulations;Joint Manipulations;Dry needling;Balance training    PT Next Visit Plan Possible Lt hip dry needling, recheck lateral shift presence.   hip/quad strengthening  PT Home Exercise Plan [email or web address]    Consulted and Agree with Plan of Care Patient             Patient will benefit from skilled therapeutic intervention in order to improve the following deficits and impairments:  Abnormal gait, Pain, Improper body mechanics, Increased fascial restricitons, Increased muscle spasms, Postural dysfunction, Decreased mobility, Decreased activity tolerance, Decreased range of motion, Decreased strength, Impaired flexibility, Difficulty walking, Hypomobility, Decreased coordination, Impaired perceived functional ability  Visit Diagnosis: Chronic left-sided low back pain with left-sided sciatica  Chronic pain of left knee  Pain in left leg  Muscle weakness (generalized)  Difficulty in walking, not elsewhere classified     Problem List Patient Active Problem List   Diagnosis Date Noted   DDD (degenerative disc disease),  lumbar 03/19/2020   Bilateral primary osteoarthritis of hip 05/22/2019   Hormone replacement therapy (HRT) 11/22/2018   Chronic idiopathic constipation 05/23/2018   Elevated LFTs, worked up by GI, essentially normal, thought to be due to mobic 05/23/2018   Diverticulosis 05/23/2018   Hypothyroidism following RAI 12/2016, on Levothyroxine 05/20/2018   Plantar fascia syndrome 02/09/2018   OA (osteoarthritis) of knee, bilateral 10/31/2017   Vasomotor symptoms due to menopause 10/31/2017   Obesity (BMI 30-39.9) 02/02/2017   Adjustment insomnia 02/02/2017   Essential hypertension 02/02/2017   Seasonal allergic rhinitis due to pollen 02/02/2017   Arthralgia of multiple joints, with negative Rheum panel 02/02/2017   Vitamin D deficiency, taking 2000 IU daily 02/02/2017   Lactose intolerance 02/02/2017   Scot Jun, PT, DPT, OCS, ATC 08/23/20  11:27 AM    [facility] [address] [place], [place], [address] Phone: [phone]   Fax:  [phone]  Name: Diane Alexander MRN: [id number] Date of Birth: Sep 12, 1960

## 2020-08-23 NOTE — Patient Instructions (Signed)
Access Code: [email or web address] URL: [email or web address] Date: 08/23/2020 Prepared by: Scot Jun  Exercises Supine Bridge - 2 x daily - 7 x weekly - 3 sets - 10 reps - 2 hold Clamshell - 2 x daily - 7 x weekly - 3 sets - 10 reps Left Standing Lateral Shift Correction at Wall - Repetitions (Mirrored) - 2-3 x daily - 7 x weekly - 1 sets - 10 reps - 5 hold  Patient Education Trigger Point Dry Needling

## 2020-08-26 ENCOUNTER — Other Ambulatory Visit: Payer: Self-pay

## 2020-08-26 ENCOUNTER — Encounter: Payer: Self-pay | Admitting: Rehabilitative and Restorative Service Providers"

## 2020-08-26 ENCOUNTER — Ambulatory Visit (INDEPENDENT_AMBULATORY_CARE_PROVIDER_SITE_OTHER): Payer: BC Managed Care – PPO | Admitting: Rehabilitative and Restorative Service Providers"

## 2020-08-26 DIAGNOSIS — M5442 Lumbago with sciatica, left side: Secondary | ICD-10-CM | POA: Diagnosis not present

## 2020-08-26 DIAGNOSIS — M6281 Muscle weakness (generalized): Secondary | ICD-10-CM | POA: Diagnosis not present

## 2020-08-26 DIAGNOSIS — M79605 Pain in left leg: Secondary | ICD-10-CM

## 2020-08-26 DIAGNOSIS — M25562 Pain in left knee: Secondary | ICD-10-CM | POA: Diagnosis not present

## 2020-08-26 DIAGNOSIS — G8929 Other chronic pain: Secondary | ICD-10-CM

## 2020-08-26 DIAGNOSIS — R262 Difficulty in walking, not elsewhere classified: Secondary | ICD-10-CM

## 2020-08-26 NOTE — Therapy (Signed)
[facility] [address] [place], [place], [address] Phone: [phone]   Fax:  [phone]  Physical Therapy Treatment  Patient Details  Name: Diane Alexander MRN: [id number] Date of Birth: [birth year] Referring Provider (PT): Dr. Lynne Leader   Encounter Date: 08/26/2020   PT End of Session - 08/26/20 1143     Visit Number 2    Number of Visits 20    Date for PT Re-Evaluation 11/01/20    Authorization Type [employer] 40 visits per year    Progress Note Due on Visit 10    PT Start Time 1142    PT Stop Time 1221    PT Time Calculation (min) 39 min    Activity Tolerance Patient tolerated treatment well    Behavior During Therapy [facility] for tasks assessed/performed             Past Medical History:  Diagnosis Date   Allergy    Bilateral primary osteoarthritis of hip 05/22/2019   Constipation    Lumbar arthropathy     Past Surgical History:  Procedure Laterality Date   BREAST CYST EXCISION     CHOLECYSTECTOMY     VAGINAL HYSTERECTOMY      There were no vitals filed for this visit.   Subjective Assessment - 08/26/20 1146     Subjective Pt. indicated she was feeling pain today.   Pt. stated difficulty from Lt hip still.  No trouble during HEP. No specific change after performance so far.    Limitations Walking;Standing;House hold activities    How [place] can you sit comfortably? "for hours"    How Wen[place] can you walk comfortably? 15 mins    Diagnostic tests MRI to be performed on back.  History of xrays on hip/knees with degenerative changes.    Patient Stated Goals Reduce pain, walk better    Currently in Pain? Yes    Pain Score 7    at worst   Pain Location Leg    Pain Orientation Left    Pain Descriptors / Indicators Aching    Pain Type Chronic pain    Pain Onset More than a month ago    Pain Frequency Intermittent    Aggravating Factors  various WB    Pain Relieving Factors nothing specific from last visit.                                [facility] Adult PT Treatment/Exercise - 08/26/20 0001       Exercises   Exercises Knee/Hip      Knee/Hip Exercises: Stretches   Other Knee/Hip Stretches single knee to opposite shoulder 30 sec x 3 Lt      Knee/Hip Exercises: Aerobic   Nustep Lvl 6 10 mins UE/LE      Knee/Hip Exercises: Supine   Bridges Both;2 sets;10 reps      Knee/Hip Exercises: Sidelying   Clams Lt 2 x 15      Manual Therapy   Manual therapy comments Compression to Lt glute med              Trigger Point Dry Needling - 08/26/20 0001     Consent Given? Yes    Education Handout Provided Yes    Muscles Treated Back/Hip Gluteus medius;Gluteus maximus   Lt   Gluteus Medius Response Twitch response elicited    Gluteus Maximus Response Twitch response elicited  PT Short Term Goals - 08/26/20 1202       PT SHORT TERM GOAL #1   Title Patient will demonstrate independent use of home exercise program to maintain progress from in clinic treatments.    Time 3    Period Weeks    Status On-going    Target Date 09/13/20               PT Struckman Term Goals - 08/23/20 1016       PT Castro TERM GOAL #1   Title Patient will demonstrate/report pain at worst less than or equal to 2/10 to facilitate minimal limitation in daily activity secondary to pain symptoms.    Time 10    Period Weeks    Status New    Target Date 11/01/20      PT Kugelman TERM GOAL #2   Title Patient will demonstrate independent use of home exercise program to facilitate ability to maintain/progress functional gains from skilled physical therapy services.    Time 10    Period Weeks    Status New    Target Date 11/01/20      PT Kiang TERM GOAL #3   Title Pt. will demonstrate FOTO outcome > or =50% to indicated reduced disability due to condition.    Time 10    Period Weeks    Status New    Target Date 11/01/20      PT Riemenschneider TERM GOAL #4   Title Patient will demonstrate  lumbar extension 100 % WFL s symptoms to facilitate upright standing, walking posture at PLOF s limitation.    Time 10    Period Weeks    Status New    Target Date 11/01/20      PT Mirsky TERM GOAL #5   Title Pt. will demonstrate Lt leg strength equal to Rt at 5/5 to facilitate usual walking, stairs and daily activity at PLOF.    Time 10    Period Weeks    Status New    Target Date 11/01/20      Additional Fecher Term Goals   Additional Ting Term Goals Yes      PT Cafaro TERM GOAL #6   Title Pt. will demonstrate reciprocal gait pattern on stairs c one hand rail a flight of stairs for household navigation.    Time 10    Period Weeks    Status New    Target Date 11/01/20                   Plan - 08/26/20 1202     Clinical Impression Statement Localized Lt lateral hip symptoms produced c trigger point palpation and twitch response from DN that provided concordant symptoms to area of complaint.  Good tolerance overall noted.  Cues required to improve HEP technique.  Continued skilled PT services indicated at this time.    Personal Factors and Comorbidities Comorbidity 1    Comorbidities hip OA    Examination-Activity Limitations Stand;Stairs;Squat;Bend;Transfers;Locomotion Level;Lift    Examination-Participation Restrictions Community Activity;Meal Prep;Shop    Stability/Clinical Decision Making Evolving/Moderate complexity    Rehab Potential --   fair to good   PT Frequency Other (comment)   1-2x/week   PT Duration Other (comment)   10 weeks   PT Treatment/Interventions ADLs/Self Care Home Management;Cryotherapy;Electrical Stimulation;Iontophoresis '4mg'$ /ml Dexamethasone;Moist Heat;Traction;Ultrasound;Functional mobility training;Stair training;Therapeutic activities;Gait training;Therapeutic exercise;Neuromuscular re-education;Manual techniques;Patient/family education;Taping;Passive range of motion;Spinal Manipulations;Joint Manipulations;Dry needling;Balance training    PT Next  Visit Plan DN as desired.  Continued hip mobility and strengthening, recheck lateral shift    PT Home Exercise Plan [id number]    Consulted and Agree with Plan of Care Patient             Patient will benefit from skilled therapeutic intervention in order to improve the following deficits and impairments:  Abnormal gait, Pain, Improper body mechanics, Increased fascial restricitons, Increased muscle spasms, Postural dysfunction, Decreased mobility, Decreased activity tolerance, Decreased range of motion, Decreased strength, Impaired flexibility, Difficulty walking, Hypomobility, Decreased coordination, Impaired perceived functional ability  Visit Diagnosis: Chronic left-sided low back pain with left-sided sciatica  Chronic pain of left knee  Pain in left leg  Muscle weakness (generalized)  Difficulty in walking, not elsewhere classified     Problem List Patient Active Problem List   Diagnosis Date Noted   DDD (degenerative disc disease), lumbar 03/19/2020   Bilateral primary osteoarthritis of hip 05/22/2019   Hormone replacement therapy (HRT) 11/22/2018   Chronic idiopathic constipation 05/23/2018   Elevated LFTs, worked up by GI, essentially normal, thought to be due to mobic 05/23/2018   Diverticulosis 05/23/2018   Hypothyroidism following RAI 12/2016, on Levothyroxine 05/20/2018   Plantar fascia syndrome 02/09/2018   OA (osteoarthritis) of knee, bilateral 10/31/2017   Vasomotor symptoms due to menopause 10/31/2017   Obesity (BMI 30-39.9) 02/02/2017   Adjustment insomnia 02/02/2017   Essential hypertension 02/02/2017   Seasonal allergic rhinitis due to pollen 02/02/2017   Arthralgia of multiple joints, with negative Rheum panel 02/02/2017   Vitamin D deficiency, taking 2000 IU daily 02/02/2017   Lactose intolerance 02/02/2017    Scot Jun, PT, DPT, OCS, ATC 08/26/20  12:13 PM    [facility] [address] [place], [place],  [address] Phone: [phone]   Fax:  [phone]  Name: Diane Alexander MRN: [id number] Date of Birth: Sep 16, 1960

## 2020-08-31 ENCOUNTER — Other Ambulatory Visit: Payer: Self-pay

## 2020-08-31 ENCOUNTER — Ambulatory Visit (INDEPENDENT_AMBULATORY_CARE_PROVIDER_SITE_OTHER): Payer: BC Managed Care – PPO | Admitting: Physical Therapy

## 2020-08-31 DIAGNOSIS — M79605 Pain in left leg: Secondary | ICD-10-CM

## 2020-08-31 DIAGNOSIS — R262 Difficulty in walking, not elsewhere classified: Secondary | ICD-10-CM

## 2020-08-31 DIAGNOSIS — M6281 Muscle weakness (generalized): Secondary | ICD-10-CM

## 2020-08-31 DIAGNOSIS — M25562 Pain in left knee: Secondary | ICD-10-CM | POA: Diagnosis not present

## 2020-08-31 DIAGNOSIS — M5442 Lumbago with sciatica, left side: Secondary | ICD-10-CM | POA: Diagnosis not present

## 2020-08-31 DIAGNOSIS — G8929 Other chronic pain: Secondary | ICD-10-CM

## 2020-08-31 NOTE — Therapy (Signed)
[facility] Physical Therapy [address] [place], [place], [address] Phone: [phone]   Fax:  [phone]  Physical Therapy Treatment  Patient Details  Name: Diane Alexander MRN: [id number] Date of Birth: 11/27/1960 Referring Provider (PT): Dr. Lynne Leader   Encounter Date: 08/31/2020   PT End of Session - 08/31/20 1201     Visit Number 3    Number of Visits 20    Date for PT Re-Evaluation 11/01/20    Authorization Type [employer] 40 visits per year    Progress Note Due on Visit 10    PT Start Time 1112    PT Stop Time 1152    PT Time Calculation (min) 40 min    Activity Tolerance Patient tolerated treatment well    Behavior During Therapy [facility] for tasks assessed/performed             Past Medical History:  Diagnosis Date   Allergy    Bilateral primary osteoarthritis of hip 05/22/2019   Constipation    Lumbar arthropathy     Past Surgical History:  Procedure Laterality Date   BREAST CYST EXCISION     CHOLECYSTECTOMY     VAGINAL HYSTERECTOMY      There were no vitals filed for this visit.   Subjective Assessment - 08/31/20 1146     Subjective relays the DN really helped for one day but then the pain came back, she says it feels like a toothache and a cramping sensation in her left posterior and lateral hip with some pain in her groin area. She states she feels like it needs to pop. She denies N/T    Limitations Walking;Standing;House hold activities    How Altemus can you sit comfortably? "for hours"    How Bendix can you walk comfortably? 15 mins    Diagnostic tests MRI to be performed on back.  History of xrays on hip/knees with degenerative changes.    Patient Stated Goals Reduce pain, walk better    Pain Onset More than a month ago              [facility] Adult PT Treatment/Exercise - 08/31/20 0001       Knee/Hip Exercises: Aerobic   Nustep L7 X 5 min      Manual Therapy   Manual therapy comments left hip Aleshire axis distraciton mobs grade 3-5,  Lt hip PROM and manual piriformis stretching,Lt sidelying lumbar rotational mobs/manip grade 4-5, (unsucessful cavitation) compression with palpation during DN              Trigger Point Dry Needling - 08/31/20 0001     Consent Given? Yes    Muscles Treated Back/Hip Gluteus minimus;Gluteus medius;Gluteus maximus;Piriformis;Tensor fascia lata    Other Dry Needling twitch response, good tolerance to this, used E-stim micro current 100 frequency to her tolerated level of intensity                    PT Short Term Goals - 08/26/20 1202       PT SHORT TERM GOAL #1   Title Patient will demonstrate independent use of home exercise program to maintain progress from in clinic treatments.    Time 3    Period Weeks    Status On-going    Target Date 09/13/20               PT Hanner Term Goals - 08/23/20 1016       PT Beier TERM GOAL #1  Title Patient will demonstrate/report pain at worst less than or equal to 2/10 to facilitate minimal limitation in daily activity secondary to pain symptoms.    Time 10    Period Weeks    Status New    Target Date 11/01/20      PT Lozinski TERM GOAL #2   Title Patient will demonstrate independent use of home exercise program to facilitate ability to maintain/progress functional gains from skilled physical therapy services.    Time 10    Period Weeks    Status New    Target Date 11/01/20      PT Isbell TERM GOAL #3   Title Pt. will demonstrate FOTO outcome > or =50% to indicated reduced disability due to condition.    Time 10    Period Weeks    Status New    Target Date 11/01/20      PT Caccavale TERM GOAL #4   Title Patient will demonstrate lumbar extension 100 % WFL s symptoms to facilitate upright standing, walking posture at PLOF s limitation.    Time 10    Period Weeks    Status New    Target Date 11/01/20      PT Tarpley TERM GOAL #5   Title Pt. will demonstrate Lt leg strength equal to Rt at 5/5 to facilitate usual walking, stairs  and daily activity at PLOF.    Time 10    Period Weeks    Status New    Target Date 11/01/20      Additional Durkee Term Goals   Additional Porte Term Goals Yes      PT Mcclintic TERM GOAL #6   Title Pt. will demonstrate reciprocal gait pattern on stairs c one hand rail a flight of stairs for household navigation.    Time 10    Period Weeks    Status New    Target Date 11/01/20                   Plan - 08/31/20 1203     Clinical Impression Statement She is still having high overall pain levels, she will have MRI this saturday so this may help guide our treatments after we know the results of this. She does still have severe trigger points noted in her Lt glutes and lateral leg so we again treated her with DN with addition of E-stim with this. She does relay she felt better after session so we will assess her longer term response to this next visit.    Personal Factors and Comorbidities Comorbidity 1    Comorbidities hip OA    Examination-Activity Limitations Stand;Stairs;Squat;Bend;Transfers;Locomotion Level;Lift    Examination-Participation Restrictions Community Activity;Meal Prep;Shop    Stability/Clinical Decision Making Evolving/Moderate complexity    Rehab Potential --   fair to good   PT Frequency Other (comment)   1-2x/week   PT Duration Other (comment)   10 weeks   PT Treatment/Interventions ADLs/Self Care Home Management;Cryotherapy;Electrical Stimulation;Iontophoresis '4mg'$ /ml Dexamethasone;Moist Heat;Traction;Ultrasound;Functional mobility training;Stair training;Therapeutic activities;Gait training;Therapeutic exercise;Neuromuscular re-education;Manual techniques;Patient/family education;Taping;Passive range of motion;Spinal Manipulations;Joint Manipulations;Dry needling;Balance training    PT Next Visit Plan check for MRI results. DN as desired.  Continued hip mobility and strengthening, recheck lateral shift    PT Home Exercise Plan [id number]    Consulted and Agree with  Plan of Care Patient             Patient will benefit from skilled therapeutic intervention in order to improve the following deficits and impairments:  Abnormal gait, Pain, Improper body mechanics, Increased fascial restricitons, Increased muscle spasms, Postural dysfunction, Decreased mobility, Decreased activity tolerance, Decreased range of motion, Decreased strength, Impaired flexibility, Difficulty walking, Hypomobility, Decreased coordination, Impaired perceived functional ability  Visit Diagnosis: Chronic left-sided low back pain with left-sided sciatica  Chronic pain of left knee  Pain in left leg  Muscle weakness (generalized)  Difficulty in walking, not elsewhere classified     Problem List Patient Active Problem List   Diagnosis Date Noted   DDD (degenerative disc disease), lumbar 03/19/2020   Bilateral primary osteoarthritis of hip 05/22/2019   Hormone replacement therapy (HRT) 11/22/2018   Chronic idiopathic constipation 05/23/2018   Elevated LFTs, worked up by GI, essentially normal, thought to be due to mobic 05/23/2018   Diverticulosis 05/23/2018   Hypothyroidism following RAI 12/2016, on Levothyroxine 05/20/2018   Plantar fascia syndrome 02/09/2018   OA (osteoarthritis) of knee, bilateral 10/31/2017   Vasomotor symptoms due to menopause 10/31/2017   Obesity (BMI 30-39.9) 02/02/2017   Adjustment insomnia 02/02/2017   Essential hypertension 02/02/2017   Seasonal allergic rhinitis due to pollen 02/02/2017   Arthralgia of multiple joints, with negative Rheum panel 02/02/2017   Vitamin D deficiency, taking 2000 IU daily 02/02/2017   Lactose intolerance 02/02/2017    Silvestre Mesi 08/31/2020, 12:06 PM  [facility] Physical Therapy [address] [place], [place], [address] Phone: [phone]   Fax:  [phone]  Name: Diane Alexander MRN: [id number] Date of Birth: Apr 17, 1960

## 2020-09-01 ENCOUNTER — Encounter: Payer: BC Managed Care – PPO | Admitting: Rehabilitative and Restorative Service Providers"

## 2020-09-01 DIAGNOSIS — R102 Pelvic and perineal pain: Secondary | ICD-10-CM | POA: Diagnosis not present

## 2020-09-04 ENCOUNTER — Other Ambulatory Visit: Payer: Self-pay

## 2020-09-04 ENCOUNTER — Ambulatory Visit
Admission: RE | Admit: 2020-09-04 | Discharge: 2020-09-04 | Disposition: A | Discharge status: Home / Self Care | Payer: BC Managed Care – PPO | Source: Ambulatory Visit | Attending: Family Medicine | Admitting: Family Medicine

## 2020-09-04 DIAGNOSIS — M5416 Radiculopathy, lumbar region: Secondary | ICD-10-CM

## 2020-09-04 DIAGNOSIS — M4807 Spinal stenosis, lumbosacral region: Secondary | ICD-10-CM | POA: Diagnosis not present

## 2020-09-04 DIAGNOSIS — M4727 Other spondylosis with radiculopathy, lumbosacral region: Secondary | ICD-10-CM | POA: Diagnosis not present

## 2020-09-04 DIAGNOSIS — M5117 Intervertebral disc disorders with radiculopathy, lumbosacral region: Secondary | ICD-10-CM | POA: Diagnosis not present

## 2020-09-04 DIAGNOSIS — G8929 Other chronic pain: Secondary | ICD-10-CM

## 2020-09-04 DIAGNOSIS — M79662 Pain in left lower leg: Secondary | ICD-10-CM

## 2020-09-07 ENCOUNTER — Encounter: Payer: Self-pay | Admitting: Family Medicine

## 2020-09-07 NOTE — Progress Notes (Signed)
Lumbar spine MRI shows a broad-based disc bulging associated with enlarged facet joints that are likely pinching nerves causing your leg pain.  Additionally back arthritis is present and could cause some back pain.  Lastly there is an abnormality at L3 level which represents likely an atypical hemangioma which is a benign typical finding.  Radiology would like to look at this again with an MRI in 6 months.  I have set a reminder.

## 2020-09-08 ENCOUNTER — Other Ambulatory Visit: Payer: Self-pay

## 2020-09-08 ENCOUNTER — Ambulatory Visit (INDEPENDENT_AMBULATORY_CARE_PROVIDER_SITE_OTHER): Payer: BC Managed Care – PPO | Admitting: Rehabilitative and Restorative Service Providers"

## 2020-09-08 ENCOUNTER — Encounter: Payer: Self-pay | Admitting: Rehabilitative and Restorative Service Providers"

## 2020-09-08 DIAGNOSIS — M6281 Muscle weakness (generalized): Secondary | ICD-10-CM

## 2020-09-08 DIAGNOSIS — M5442 Lumbago with sciatica, left side: Secondary | ICD-10-CM

## 2020-09-08 DIAGNOSIS — M79605 Pain in left leg: Secondary | ICD-10-CM | POA: Diagnosis not present

## 2020-09-08 DIAGNOSIS — M25562 Pain in left knee: Secondary | ICD-10-CM

## 2020-09-08 DIAGNOSIS — R262 Difficulty in walking, not elsewhere classified: Secondary | ICD-10-CM

## 2020-09-08 DIAGNOSIS — G8929 Other chronic pain: Secondary | ICD-10-CM

## 2020-09-08 NOTE — Patient Instructions (Signed)
Access Code: [email or web address] URL: [email or web address] Date: 09/08/2020 Prepared by: Scot Jun  Exercises Supine Bridge - 2 x daily - 7 x weekly - 3 sets - 10 reps - 2 hold Clamshell - 2 x daily - 7 x weekly - 3 sets - 10 reps Left Standing Lateral Shift Correction at Loraine (Mirrored) - 2-3 x daily - 7 x weekly - 1 sets - 10 reps - 5 hold Sidelying Hip Abduction - 1 x daily - 7 x weekly - 3 sets - 10 reps Modified Thomas Stretch (Mirrored) - 2 x daily - 7 x weekly - 1 sets - 5 reps - 15-30 hold Supine Hip Adductor Stretch - 2 x daily - 7 x weekly - 1 sets - 5 reps - 15-30 hold

## 2020-09-08 NOTE — Therapy (Addendum)
[facility] Physical Therapy [address] [place], [place], [address] Phone: [phone]   Fax:  [phone]  Physical Therapy Treatment / DIscharge  Patient Details  Name: Diane Alexander MRN: [id number] Date of Birth: 1961-01-02 Referring Provider (PT): Dr. Lynne Leader   Encounter Date: 09/08/2020   PT End of Session - 09/08/20 0925     Visit Number 4    Number of Visits 20    Date for PT Re-Evaluation 11/01/20    Authorization Type [employer] 40 visits per year    Progress Note Due on Visit 10    PT Start Time 0926    PT Stop Time 1005    PT Time Calculation (min) 39 min    Activity Tolerance Patient tolerated treatment well    Behavior During Therapy [facility] for tasks assessed/performed             Past Medical History:  Diagnosis Date   Allergy    Bilateral primary osteoarthritis of hip 05/22/2019   Constipation    Lumbar arthropathy     Past Surgical History:  Procedure Laterality Date   BREAST CYST EXCISION     CHOLECYSTECTOMY     VAGINAL HYSTERECTOMY      There were no vitals filed for this visit.   Subjective Assessment - 09/08/20 0931     Subjective Pt. stated feeling less severe pain that previously noted.  Pt. stated today pain was around both lateral hips but Lt more.  Had MRI c results noted in chart.  Seeing MD tomorrow for review of MRI    Limitations Walking;Standing;House hold activities    How Lukasiewicz can you sit comfortably? "for hours"    How Devargas can you walk comfortably? 15 mins    Diagnostic tests MRI to be performed on back.  History of xrays on hip/knees with degenerative changes.    Patient Stated Goals Reduce pain, walk better    Currently in Pain? Yes    Pain Score 5     Pain Location Leg    Pain Orientation Left;Right   Lt more than Rt   Pain Descriptors / Indicators Aching    Pain Type Chronic pain    Pain Onset More than a month ago    Pain Frequency Intermittent    Aggravating Factors  standing, pressure    Pain  Relieving Factors treatments have shown some progress                [facility] PT Assessment - 09/08/20 0001       Assessment   Medical Diagnosis M25.562,G89.29 (ICD-10-CM) - Chronic pain of left knee  M79.662 (ICD-10-CM) - Pain of left calf  M54.16 (ICD-10-CM) - Lumbar radiculopathy    Referring Provider (PT) Dr. Lynne Leader    Onset Date/Surgical Date 08/11/18    Hand Dominance Right      Strength   Left Hip ABduction 4/5      Palpation   Palpation comment Trigger points in Lt glute med/min/max, lateral quad, IT band tenderness noted                           [facility] Adult PT Treatment/Exercise - 09/08/20 0001       Knee/Hip Exercises: Stretches   Other Knee/Hip Stretches single knee to opposite shoulder 30 sec x 3 Lt    Other Knee/Hip Stretches supine hip adductor stretch 15 sec x 3, supine thomas stretch 15 sec x 3 Lt  Knee/Hip Exercises: Standing   Other Standing Knee Exercises standing lumbar extension x 10, lateral shift correction at wall (wall to Rt shoulder) 5 sec hold x 10      Knee/Hip Exercises: Supine   Bridges Both;15 reps   5 sec holds     Knee/Hip Exercises: Sidelying   Hip ABduction 2 sets;10 reps;Both    Clams Lt 20x      Manual Therapy   Manual therapy comments compression to Lt Glute med/min              Trigger Point Dry Needling - 09/08/20 0001     Consent Given? Yes    Education Handout Provided No    Muscles Treated Back/Hip Gluteus medius   Lt   Gluteus Medius Response Twitch response elicited                Upper Extremity Functional Index Score :   /80   PT Education - 09/08/20 1003     Education Details HEP progression.    Person(s) Educated Patient    Methods Explanation;Demonstration;Verbal cues;Handout    Comprehension Returned demonstration;Verbalized understanding              PT Short Term Goals - 09/08/20 0941       PT SHORT TERM GOAL #1   Title Patient will demonstrate independent  use of home exercise program to maintain progress from in clinic treatments.    Time 3    Period Weeks    Status Achieved    Target Date 09/13/20               PT Carrithers Term Goals - 08/23/20 1016       PT Wrinkle TERM GOAL #1   Title Patient will demonstrate/report pain at worst less than or equal to 2/10 to facilitate minimal limitation in daily activity secondary to pain symptoms.    Time 10    Period Weeks    Status New    Target Date 11/01/20      PT Atha TERM GOAL #2   Title Patient will demonstrate independent use of home exercise program to facilitate ability to maintain/progress functional gains from skilled physical therapy services.    Time 10    Period Weeks    Status New    Target Date 11/01/20      PT Miler TERM GOAL #3   Title Pt. will demonstrate FOTO outcome > or =50% to indicated reduced disability due to condition.    Time 10    Period Weeks    Status New    Target Date 11/01/20      PT Nevers TERM GOAL #4   Title Patient will demonstrate lumbar extension 100 % WFL s symptoms to facilitate upright standing, walking posture at PLOF s limitation.    Time 10    Period Weeks    Status New    Target Date 11/01/20      PT Lares TERM GOAL #5   Title Pt. will demonstrate Lt leg strength equal to Rt at 5/5 to facilitate usual walking, stairs and daily activity at PLOF.    Time 10    Period Weeks    Status New    Target Date 11/01/20      Additional Burks Term Goals   Additional Orihuela Term Goals Yes      PT Stonerock TERM GOAL #6   Title Pt. will demonstrate reciprocal gait pattern on stairs c one hand rail a  flight of stairs for household navigation.    Time 10    Period Weeks    Status New    Target Date 11/01/20                   Plan - 09/08/20 0930     Clinical Impression Statement Pt. has reported reduced severity in symptoms as compared to evaluation presentation with some progress noted in treatments.  Reassessment continued to show lateral  hip weakness on Lt > Rt at this time that can continue to improve c use of HEP.  Continued inclusion on lumbar and hip regions to address presentation.  Groin complaints were indicated from adductor stretch and improved after performance.    Personal Factors and Comorbidities Comorbidity 1    Comorbidities hip OA    Examination-Activity Limitations Stand;Stairs;Squat;Bend;Transfers;Locomotion Level;Lift    Examination-Participation Restrictions Community Activity;Meal Prep;Shop    Stability/Clinical Decision Making Evolving/Moderate complexity    Rehab Potential --   fair to good   PT Frequency Other (comment)   1-2x/week   PT Duration Other (comment)   10 weeks   PT Treatment/Interventions ADLs/Self Care Home Management;Cryotherapy;Electrical Stimulation;Iontophoresis 17m/ml Dexamethasone;Moist Heat;Traction;Ultrasound;Functional mobility training;Stair training;Therapeutic activities;Gait training;Therapeutic exercise;Neuromuscular re-education;Manual techniques;Patient/family education;Taping;Passive range of motion;Spinal Manipulations;Joint Manipulations;Dry needling;Balance training    PT Next Visit Plan DN prn.  Continued posterior/lateral hip strengthening, lumbar mobility gains.    PT Home Exercise Plan [id number]    Consulted and Agree with Plan of Care Patient             Patient will benefit from skilled therapeutic intervention in order to improve the following deficits and impairments:  Abnormal gait, Pain, Improper body mechanics, Increased fascial restricitons, Increased muscle spasms, Postural dysfunction, Decreased mobility, Decreased activity tolerance, Decreased range of motion, Decreased strength, Impaired flexibility, Difficulty walking, Hypomobility, Decreased coordination, Impaired perceived functional ability  Visit Diagnosis: Chronic left-sided low back pain with left-sided sciatica  Chronic pain of left knee  Pain in left leg  Muscle weakness  (generalized)  Difficulty in walking, not elsewhere classified     Problem List Patient Active Problem List   Diagnosis Date Noted   DDD (degenerative disc disease), lumbar 03/19/2020   Bilateral primary osteoarthritis of hip 05/22/2019   Hormone replacement therapy (HRT) 11/22/2018   Chronic idiopathic constipation 05/23/2018   Elevated LFTs, worked up by GI, essentially normal, thought to be due to mobic 05/23/2018   Diverticulosis 05/23/2018   Hypothyroidism following RAI 12/2016, on Levothyroxine 05/20/2018   Plantar fascia syndrome 02/09/2018   OA (osteoarthritis) of knee, bilateral 10/31/2017   Vasomotor symptoms due to menopause 10/31/2017   Obesity (BMI 30-39.9) 02/02/2017   Adjustment insomnia 02/02/2017   Essential hypertension 02/02/2017   Seasonal allergic rhinitis due to pollen 02/02/2017   Arthralgia of multiple joints, with negative Rheum panel 02/02/2017   Vitamin D deficiency, taking 2000 IU daily 02/02/2017   Lactose intolerance 02/02/2017   MScot Jun PT, DPT, OCS, ATC 09/08/20  10:09 AM  PHYSICAL THERAPY DISCHARGE SUMMARY  Visits from Start of Care: 4  Current functional level related to goals / functional outcomes: See note   Remaining deficits: See note   Education / Equipment: HEP   Patient agrees to discharge. Patient goals were partially met. Patient is being discharged due to not returning since the last visit.  MScot Jun PT, DPT, OCS, ATC 10/08/20  9:11 AM      [facility] [address]GCano [place] [address]Phone: [phone]  Fax:  [phone]  Name: Diane Alexander MRN: [id number] Date of Birth: 1960/02/14

## 2020-09-09 ENCOUNTER — Ambulatory Visit: Payer: BC Managed Care – PPO | Admitting: Family Medicine

## 2020-09-09 ENCOUNTER — Ambulatory Visit: Payer: Self-pay

## 2020-09-09 ENCOUNTER — Encounter: Payer: Self-pay | Admitting: Family Medicine

## 2020-09-09 VITALS — BP 100/64 | HR 80 | Ht 64.0 in | Wt 223.0 lb

## 2020-09-09 DIAGNOSIS — M5416 Radiculopathy, lumbar region: Secondary | ICD-10-CM | POA: Diagnosis not present

## 2020-09-09 DIAGNOSIS — M160 Bilateral primary osteoarthritis of hip: Secondary | ICD-10-CM | POA: Diagnosis not present

## 2020-09-09 DIAGNOSIS — M25552 Pain in left hip: Secondary | ICD-10-CM

## 2020-09-09 DIAGNOSIS — D1809 Hemangioma of other sites: Secondary | ICD-10-CM | POA: Insufficient documentation

## 2020-09-09 NOTE — Patient Instructions (Addendum)
Good to see you today.  You had a L hip injection.  Call or go to the ER if you develop a large red swollen joint with extreme pain or oozing puss.    Call or go to the ER if you develop a large red swollen joint with extreme pain or oozing puss.    The numbing medicine will last a few hours and then it will wear off.   The cortisone will start working in a day or two.   If this does not last very Wendel next step is MRI arthrogram to know whats wrong better.   Ok to pause on PT for now.   Let me know how this goes.

## 2020-09-09 NOTE — Progress Notes (Signed)
I, Wendy Poet, LAT, ATC, am serving as scribe for Dr. Lynne Leader.  Diane Alexander is a 60 y.o. female who presents to [place] at [facility] today for f/u of L leg pain thought to be due to lumbar radiculopathy.  She was last seen by Dr. Georgina Snell on 08/16/20 and was referred for an MRI and to PT of which she has completed 4 sessions.  Today, pt reports that she has been getting dry needling at PT which is helping her L lateral hip and lateral thigh somewhat but she con't to have pain in those areas.  She states that her most painful area is her groin and ant hip and wants to know if that location of pain correlates to what was found on her MRI.  She con't to deny any low back pain  Today she notes the pain is primarily located in the anterior hip and anterior thigh.  Diagnostic testing: L-spine MRI- 09/04/20; B hip XR- 05/22/19; L-spine XR- 08/27/18  Pertinent review of systems: No fevers or chills  Relevant historical information: Hypertension.   Exam:  BP 100/64 (BP Location: Right Arm, Patient Position: Sitting, Cuff Size: Large)   Pulse 80   Ht '5\' 4"'$  (1.626 m)   Wt 223 lb (101.2 kg)   LMP  (LMP Unknown)   SpO2 97%   BMI 38.28 kg/m  General: Well Developed, well nourished, and in no acute distress.   MSK: L-spine nontender midline.  Normal lumbar motion. Left hip normal-appearing pain with hip flexion.  Tender palpation greater trochanter.    Lab and Radiology Results  Procedure: Real-time Ultrasound Guided Injection of left hip femoral acetabular joint Device: Philips Affiniti 50G Images permanently stored and available for review in PACS Verbal informed consent obtained.  Discussed risks and benefits of procedure. Warned about infection bleeding damage to structures skin hypopigmentation and fat atrophy among others. Patient expresses understanding and agreement Time-out conducted.   Noted no overlying erythema, induration, or other signs of local  infection.   Skin prepped in a sterile fashion.   Local anesthesia: Topical Ethyl chloride.   With sterile technique and under real time ultrasound guidance: 40 mg of Kenalog and 2 mL of Marcaine injected into hip joint. Fluid seen entering the joint capsule.   Completed without difficulty   Pain immediately resolved suggesting accurate placement of the medication.   Advised to call if fevers/chills, erythema, induration, drainage, or persistent bleeding.   Images permanently stored and available for review in the ultrasound unit.  Impression: Technically successful ultrasound guided injection.     ADDENDUM REPORT: 09/05/2020 10:26   ADDENDUM: The lesion at L3 most likely represents atypical hemangioma. Recommend follow-up MRI without and with contrast in 6 months to assure stability.   These results will be called to the ordering clinician or representative by the Radiologist Assistant, and communication documented in the PACS or [employer].     Electronically Signed   By: San Morelle M.D.   On: 09/05/2020 10:26    Addended by San Morelle, MD on 09/05/2020 10:28 AM   Study Result  Narrative & Impression  CLINICAL DATA:  Lumbar radiculopathy for greater than 6 weeks. Low back pain extending to left buttocks and groin and upper leg for 2 years. Numbness in the right foot.   EXAM: MRI LUMBAR SPINE WITHOUT CONTRAST   TECHNIQUE: Multiplanar, multisequence MR imaging of the lumbar spine was performed. No intravenous contrast was administered.   COMPARISON:  None.   FINDINGS: Segmentation: 5 non rib-bearing lumbar type vertebral bodies are present. The lowest fully formed vertebral body is L5.   Alignment: Slight anterolisthesis is present at L4-5. No other significant listhesis is present. Lumbar lordosis preserved.   Vertebrae: T2 hyperintense lesion at L3 measures 2.3 x 2.3 cm on the left extending to the lateral margin of the vertebral body.  The lesion is stable in size compared to prior CT of 2016. There is some T1 signal hyperintensity within the lesion.   Conus medullaris and cauda equina: Conus extends to the L2 level. Conus and cauda equina appear normal.   Paraspinal and other soft tissues: Limited imaging the abdomen is unremarkable. There is no significant adenopathy. No solid organ lesions are present.   Disc levels:   L1-2: Mild facet hypertrophy is present bilaterally. No significant disc protrusion or stenosis.   L2-3: Mild facet hypertrophy is worse on the right. No significant disc protrusion or stenosis is present.   L3-4: Moderate facet hypertrophy is present bilaterally. Mild disc bulging is noted. No significant stenosis is present.   L4-5: A broad-based disc protrusion is present. Moderate facet hypertrophy and ligamentum flavum thickening is noted. Mild central and bilateral foraminal narrowing is evident.   L5-S1: Moderate facet hypertrophy is noted bilaterally. Mild disc bulging is noted. No significant stenosis is present.   IMPRESSION: 1. Mild central and bilateral foraminal narrowing at L4-5 secondary to a broad-based disc protrusion and moderate facet hypertrophy. 2. Moderate facet hypertrophy and mild disc bulging at L3-4 and L5-S1 without significant stenosis at these levels.   Electronically Signed: By: San Morelle M.D. On: 09/05/2020 10:15     I, Lynne Leader, personally (independently) visualized and performed the interpretation of the images attached in this note.     Assessment and Plan: 60 y.o. female with left leg pain.  Multifactorial.  Patient does have a little bit of pain extending past the knee but the dominant source of her pain seems to be related to intra-articular hip pain.  She had immediate great response to intra-articular hip injection today in clinic.  If the intra-articular hip injection does not provide lasting relief neck step would be MRI arthrogram  of the hip to evaluate source of pain.  She probably does have some L4 or L5 radicular pain this seems to be a much less dominant issue.  She does have an incidental probable L3 hemangioma seen on MRI that needs reassessment in about 6 months (March 2023) with MRI with and without contrast.   PDMP not reviewed this encounter. Orders Placed This Encounter  Procedures   [place] LIMITED JOINT SPACE STRUCTURES LOW LEFT(NO LINKED CHARGES)    Order Specific Question:   Reason for Exam (SYMPTOM  OR DIAGNOSIS REQUIRED)    Answer:   L hip pain    Order Specific Question:   Preferred imaging location?    Answer:   [place]   No orders of the defined types were placed in this encounter.    Discussed warning signs or symptoms. Please see discharge instructions. Patient expresses understanding.   The above documentation has been reviewed and is accurate and complete Lynne Leader, M.D.

## 2020-09-15 ENCOUNTER — Encounter: Payer: BC Managed Care – PPO | Admitting: Physical Therapy

## 2020-09-20 ENCOUNTER — Encounter: Payer: BC Managed Care – PPO | Admitting: Rehabilitative and Restorative Service Providers"

## 2020-09-23 ENCOUNTER — Encounter: Payer: BC Managed Care – PPO | Admitting: Rehabilitative and Restorative Service Providers"

## 2020-09-27 ENCOUNTER — Encounter: Payer: BC Managed Care – PPO | Admitting: Rehabilitative and Restorative Service Providers"

## 2020-09-29 ENCOUNTER — Encounter: Payer: BC Managed Care – PPO | Admitting: Rehabilitative and Restorative Service Providers"

## 2020-10-05 ENCOUNTER — Encounter: Payer: BC Managed Care – PPO | Admitting: Rehabilitative and Restorative Service Providers"

## 2020-10-07 ENCOUNTER — Encounter: Payer: BC Managed Care – PPO | Admitting: Rehabilitative and Restorative Service Providers"

## 2020-12-02 NOTE — Progress Notes (Signed)
I, Peterson Lombard, LAT, ATC acting as a scribe for Lynne Leader, MD.  Diane Alexander is a 60 y.o. female who presents to [place] at [facility] today for continued L leg pain thought to be due to lumbar radiculopathy. She was last seen by Dr. Georgina Snell on 09/09/20 and was given a L hip femoral acetabular steroid injection. Of note, pt's MRI revealed a lesion at L3 most likely represents atypical hemangioma and pt was advised to MRI without and with contrast in 6 months. Today, pt reports L leg pain is about the same. Pt notes increased pain on Tues, 11/29. Pt had 1-2 months of improved pain from prior steroid injection.  She had a intra-articular left hip injection September 8 and had immediate benefit right after the injection which helped her pain significantly.  The injection lasted about a month or so overall.  Dx imaging: 09/05/20 L-spine MRI  05/22/19 Bilat hip XR 08/27/18 L knee & L-spine XR  Pertinent review of systems: No fevers or chills  Relevant historical information: Hypertension   Exam:  BP 116/78   Pulse 82   Ht 5\' 4"  (1.626 m)   Wt 225 lb (102.1 kg)   LMP  (LMP Unknown)   SpO2 99%   BMI 38.62 kg/m  General: Well Developed, well nourished, and in no acute distress.   MSK: Left hip normal-appearing Decreased range of motion pain with flexion and internal rotation. Intact strength.    Lab and Radiology Results  EXAM: DG HIP (WITH OR WITHOUT PELVIS) 2V BILAT   COMPARISON:  None   FINDINGS: Osseous mineralization normal.   SI joint spaces preserved.   Narrowing of hip joints bilaterally slightly greater on RIGHT with RIGHT femoral head spur formation.   No acute fracture, dislocation, or bone destruction.   Small pelvic phleboliths.   IMPRESSION: Degenerative changes of BILATERAL hip joints without acute abnormalities.     Electronically Signed   By: Lavonia Dana M.D.   On: 05/22/2019 10:06 I, Lynne Leader, personally (independently)  visualized and performed the interpretation of the images attached in this note.     Assessment and Plan: 60 y.o. female with left leg pain.  Pain thought to be due to intra-articular hip etiology.  Most likely labrum tear as she does have minimal hip arthritis but not enough to sufficient Splane the amount of pain and discomfort she is experiencing.  Majority the pain is anterior hip which is thought to be labrum related.  She also has some lateral thigh pain thought to be hip abductor or potentially meralgia paresthetica or both.  She also some posterior thigh pain which may be intra-articular hip or hip abductor may be even hamstring.  Plan for MRI arthrogram.  This should help evaluate cause of hip pain including labrum tear, hip abductor tendinopathy, worse arthritis than was seen on x-ray etc.  Recheck after MRI.  Total encounter time 30 minutes including face-to-face time with the patient and, reviewing past medical record, and charting on the date of service.   Extensive discussion treatment plan and options.   PDMP not reviewed this encounter. Orders Placed This Encounter  Procedures   MR HIP LEFT W CONTRAST    MRI arthogram only. NO IV contrast.  Schedule with Dr T 1 hr prior to MRI for injection.    Standing Status:   Future    Standing Expiration Date:   12/03/2021    Scheduling Instructions:     MRI arthogram  only. NO IV contrast.      Schedule with Dr T 1 hr prior to MRI for injection.    Order Specific Question:   If indicated for the ordered procedure, I authorize the administration of contrast media per Radiology protocol    Answer:   Yes    Order Specific Question:   What is the patient's sedation requirement?    Answer:   No Sedation    Order Specific Question:   Does the patient have a pacemaker or implanted devices?    Answer:   No    Order Specific Question:   Preferred imaging location?    Answer:   [facility] (table limit-350lbs)   No orders of the  defined types were placed in this encounter.    Discussed warning signs or symptoms. Please see discharge instructions. Patient expresses understanding.   The above documentation has been reviewed and is accurate and complete Lynne Leader, M.D.

## 2020-12-03 ENCOUNTER — Ambulatory Visit: Payer: BC Managed Care – PPO | Admitting: Family Medicine

## 2020-12-03 ENCOUNTER — Other Ambulatory Visit: Payer: Self-pay

## 2020-12-03 VITALS — BP 116/78 | HR 82 | Ht 64.0 in | Wt 225.0 lb

## 2020-12-03 DIAGNOSIS — M25552 Pain in left hip: Secondary | ICD-10-CM

## 2020-12-03 NOTE — Patient Instructions (Addendum)
Thank you for coming in today.   You should hear from MRI scheduling within 1 week. If you do not hear please let me know.    Recheck after the MRI.

## 2020-12-13 ENCOUNTER — Ambulatory Visit: Payer: BC Managed Care – PPO | Admitting: Sports Medicine

## 2021-01-17 ENCOUNTER — Ambulatory Visit: Payer: BC Managed Care – PPO | Admitting: Family Medicine

## 2021-01-17 ENCOUNTER — Ambulatory Visit (INDEPENDENT_AMBULATORY_CARE_PROVIDER_SITE_OTHER): Payer: BC Managed Care – PPO

## 2021-01-17 ENCOUNTER — Other Ambulatory Visit: Payer: Self-pay

## 2021-01-17 ENCOUNTER — Encounter: Payer: Self-pay | Admitting: Family Medicine

## 2021-01-17 ENCOUNTER — Ambulatory Visit: Payer: Self-pay

## 2021-01-17 VITALS — BP 130/78 | HR 84 | Ht 64.0 in | Wt 222.6 lb

## 2021-01-17 DIAGNOSIS — M25552 Pain in left hip: Secondary | ICD-10-CM

## 2021-01-17 IMAGING — DX DG HIP (WITH OR WITHOUT PELVIS) 2-3V*L*
3 series · 3 of 3 positions shown · non-contrast
Comparison: [DATE]

CLINICAL DATA: Left groin and hip pain.

EXAM:
DG HIP (WITH OR WITHOUT PELVIS) 2-3V LEFT

[pelvis ap]
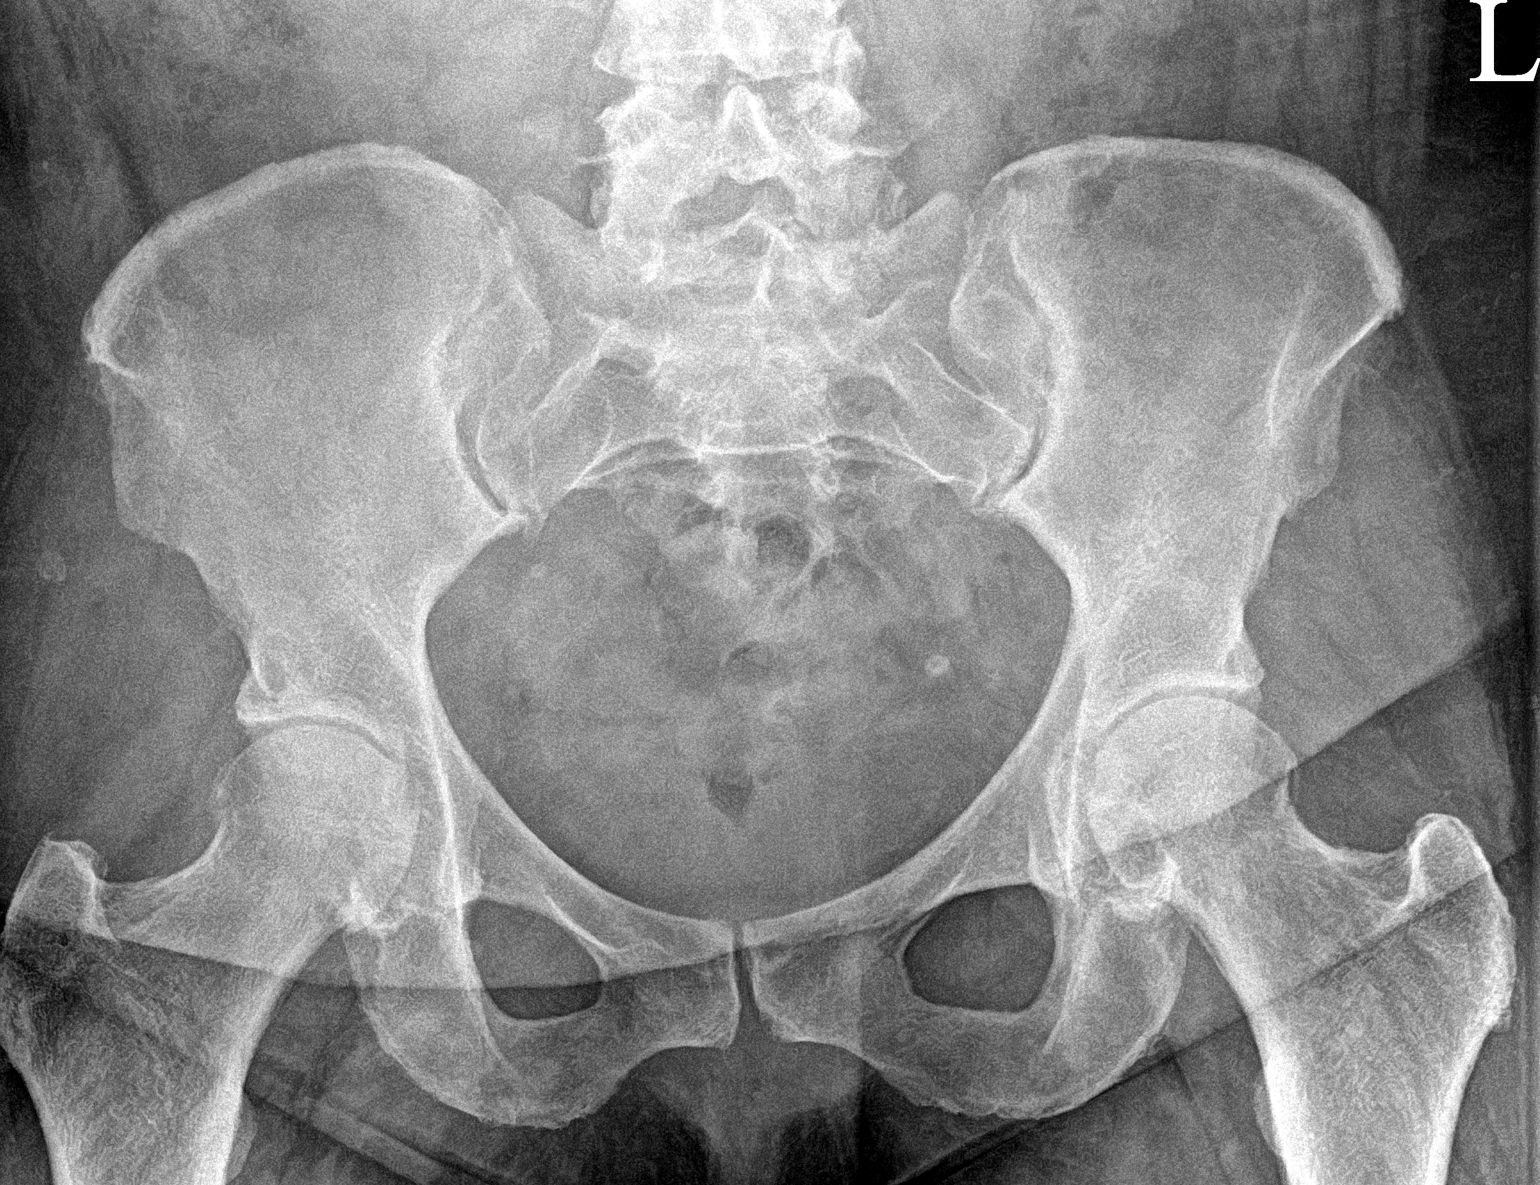

[hip ap]
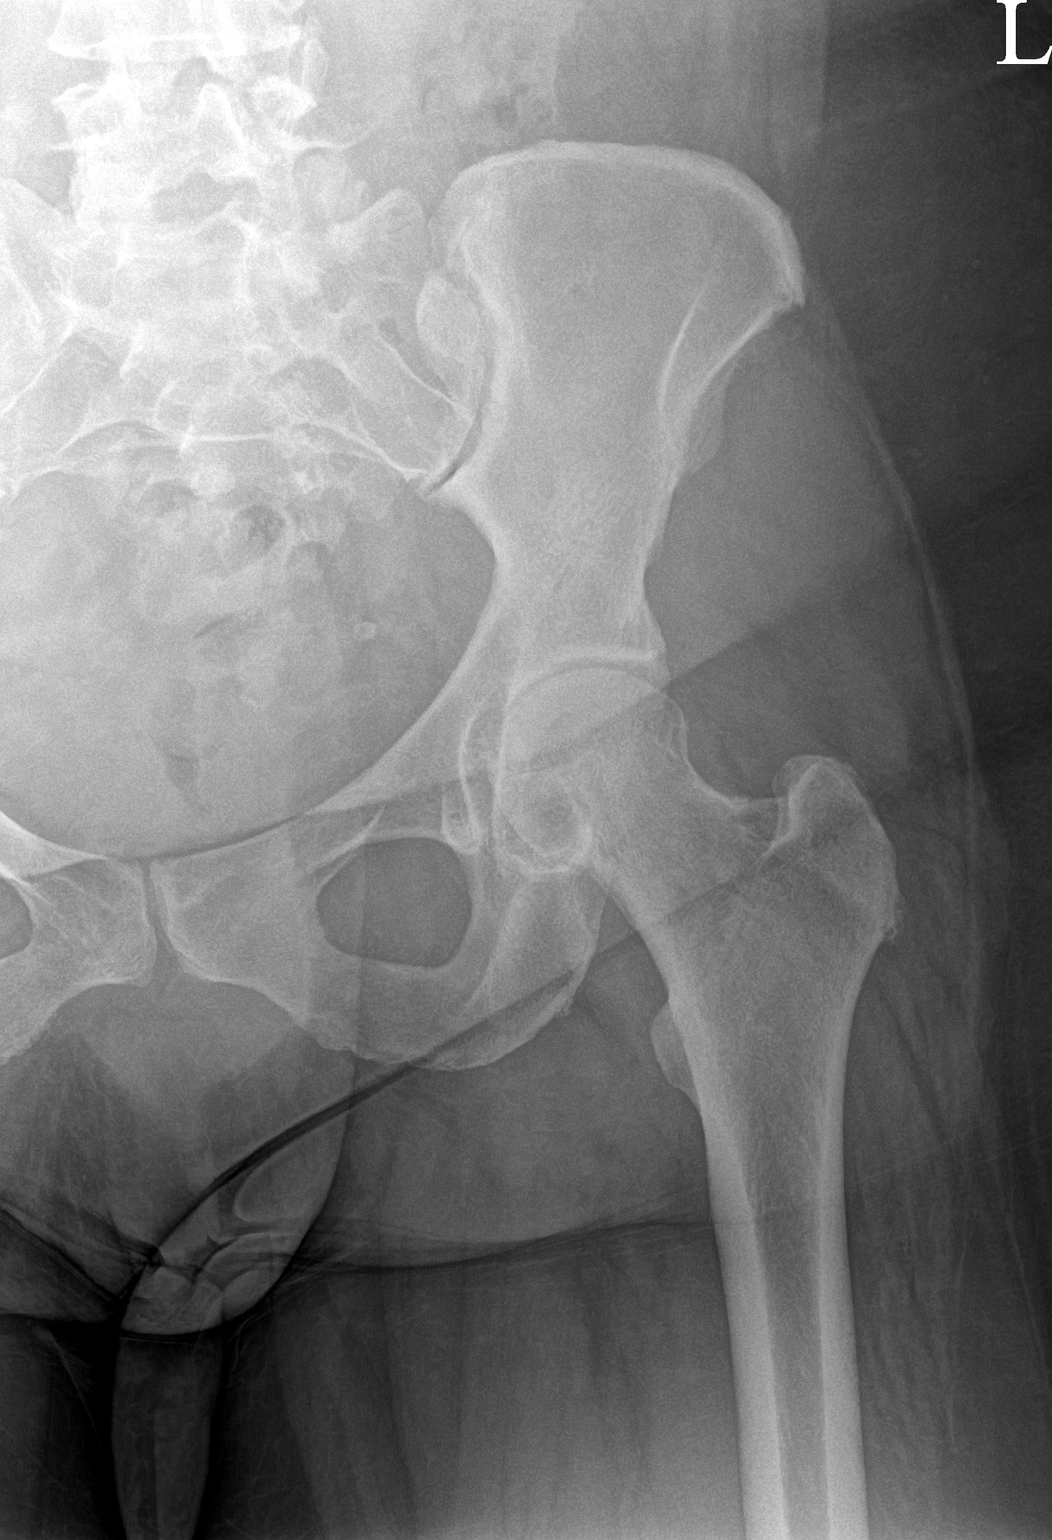

[hip frog leg]
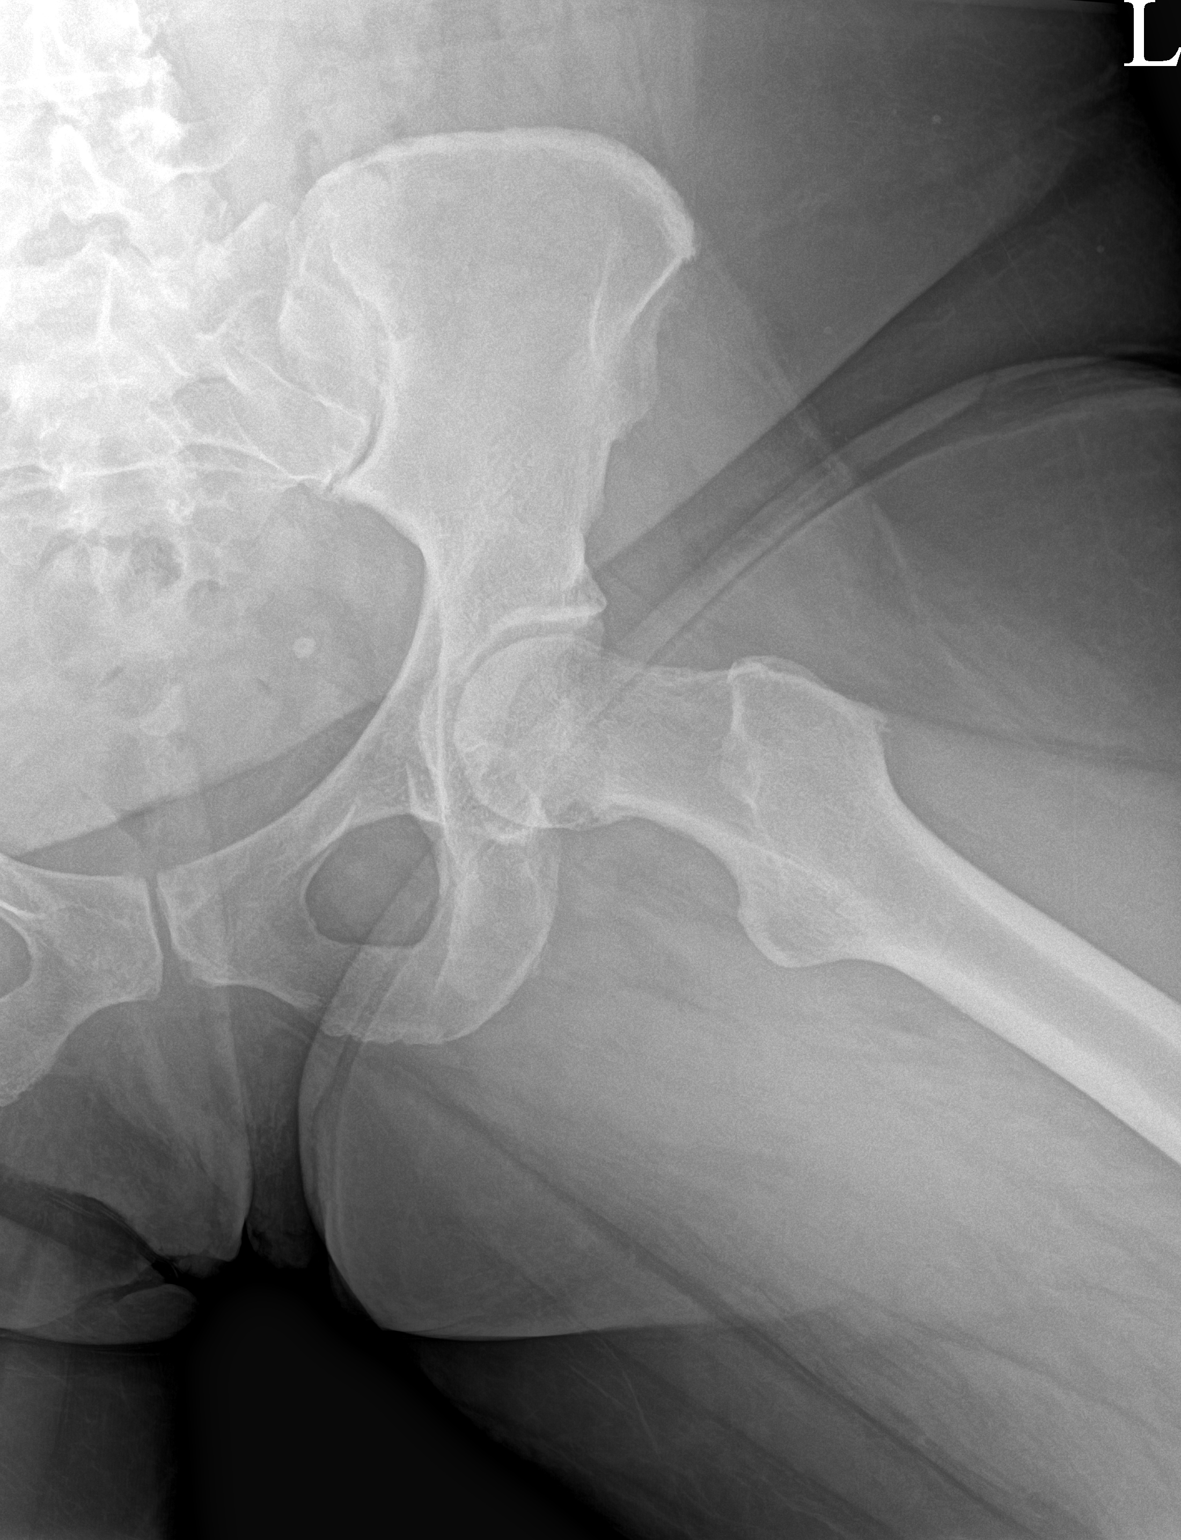

[3 of 3 positions shown; findings below may reference images not displayed]

FINDINGS: There is no evidence of hip fracture or dislocation. Narrow
bilateral hip joint spaces are identified. Pelvic phleboliths are
identified.
IMPRESSION: No acute fracture or dislocation.

## 2021-01-17 NOTE — Progress Notes (Signed)
° °  I, Wendy Poet, LAT, ATC, am serving as scribe for Dr. Lynne Leader.  Diane Alexander is a 61 y.o. female who presents to [place] at [facility] today for cont L hip pain. Pt was last seen by Dr. Georgina Snell no 12/03/20 for lumbar radiculopathy and was advised to proceed to MRI arthrogram to further evaluate the cause of her hip pain. The MRI arthrogram was scheduled for 12/13/20, however it was canceled due to a work conflict. Today, pt reports that her L hip pain is worsening since she was last seen.  Her main goal is to figure out why her hip hurts and to get a feeling better.  Dx imaging: 09/05/20 L-spine MRI             05/22/19 Bilat hip XR 08/27/18 L knee & L-spine XR  Pertinent review of systems: No fevers or chills  Relevant historical information: Hypertension   Exam:  BP 130/78 (BP Location: Left Arm, Patient Position: Sitting, Cuff Size: Normal)    Pulse 84    Ht 5\' 4"  (1.626 m)    Wt 222 lb 9.6 oz (101 kg)    LMP  (LMP Unknown)    SpO2 99%    BMI 38.21 kg/m  General: Well Developed, well nourished, and in no acute distress.   MSK: Left hip normal-appearing decreased hip motion pain with flexion and internal rotation.    Lab and Radiology Results  X-ray images left hip obtained today personally and independently interpreted. Mild left hip DJD.  No acute fractures. Await formal radiology review    Assessment and Plan: 61 y.o. female with left groin and leg pain.  This has been ongoing for around a year.  She said extensive treatment trials of physical therapy with not sufficient benefit.  She even had an MRI of her lumbar spine which does show some potential for L4 lumbar radiculopathy but not enough to explain her groin pain.  She did have a intra-articular hip injection September 2022 which worked very well for relatively short period of time.  At this point proceed to MRI arthrogram to further explain her pain and for potential surgical planning.  This was  scheduled first December but had to be canceled.  Spent quite a bit of time discussing likely diagnosis treatment plan and options going forward as well as diagnostic goals and steps.   PDMP not reviewed this encounter. Orders Placed This Encounter  Procedures   DG HIP UNILAT WITH PELVIS 2-3 VIEWS LEFT    Standing Status:   Future    Number of Occurrences:   1    Standing Expiration Date:   01/17/2022    Order Specific Question:   Reason for Exam (SYMPTOM  OR DIAGNOSIS REQUIRED)    Answer:   left hip pain. xray update    Order Specific Question:   Preferred imaging location?    Answer:   [facility]   No orders of the defined types were placed in this encounter.    Discussed warning signs or symptoms. Please see discharge instructions. Patient expresses understanding.   The above documentation has been reviewed and is accurate and complete Lynne Leader, M.D.   Total encounter time 30 minutes including face-to-face time with the patient and, reviewing past medical record, and charting on the date of service.

## 2021-01-17 NOTE — Patient Instructions (Addendum)
Good to see you.  Please get an Xray today before you leave  If the xray does not show significant arthritis I will order MRI arthrogram to [facility] imaging.

## 2021-01-18 NOTE — Progress Notes (Signed)
Left hip x-ray does not look much different than it did in 2021.  I have ordered an MRI arthrogram to better understand the source of your groin pain.

## 2021-01-31 DIAGNOSIS — E890 Postprocedural hypothyroidism: Secondary | ICD-10-CM | POA: Diagnosis not present

## 2021-02-11 ENCOUNTER — Telehealth: Payer: Self-pay | Admitting: Family Medicine

## 2021-02-11 ENCOUNTER — Encounter: Payer: Self-pay | Admitting: Family Medicine

## 2021-02-11 NOTE — Telephone Encounter (Signed)
Patient called wanting to make sure that the MRI that was ordered and is scheduled for Monday will show her groin? She said that this is the area she was wanting looked at. Please advise.

## 2021-02-14 ENCOUNTER — Other Ambulatory Visit: Payer: Self-pay

## 2021-02-14 ENCOUNTER — Ambulatory Visit
Admission: RE | Admit: 2021-02-14 | Discharge: 2021-02-14 | Disposition: A | Discharge status: Home / Self Care | Payer: BC Managed Care – PPO | Source: Ambulatory Visit | Attending: Family Medicine | Admitting: Family Medicine

## 2021-02-14 DIAGNOSIS — M25552 Pain in left hip: Secondary | ICD-10-CM

## 2021-02-14 DIAGNOSIS — M160 Bilateral primary osteoarthritis of hip: Secondary | ICD-10-CM | POA: Diagnosis not present

## 2021-02-14 IMAGING — XA DG FLUORO GUIDE NDL PLC/BX
1 series · 1 of 1 positions shown · non-contrast
Comparison: none

CLINICAL DATA: Left hip pain.

[Series 1: ortho standard · 1 of 1 slices shown]
[im 1/1]
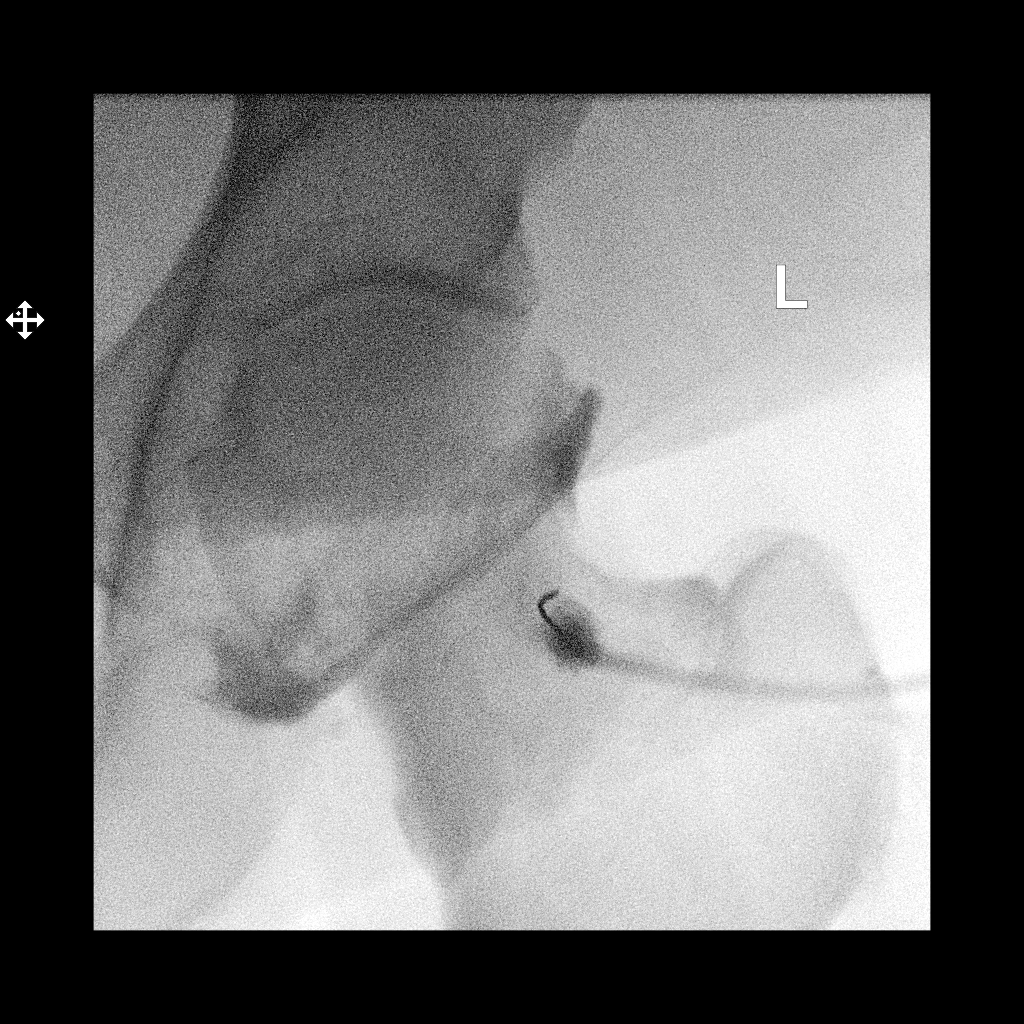

[1 of 1 positions shown; findings below may reference images not displayed]

FLUOROSCOPY TIME:  Radiation Exposure Index (as provided by the
fluoroscopic device): 1.8 mGy Kerma

PROCEDURE:
The risks and benefits of the procedure were discussed with the
patient, and written informed consent was obtained. The patient
stated no history of allergy to contrast media. A formal timeout
procedure was performed with the patient according to departmental
protocol.

The patient was placed supine on the fluoroscopy table and the left
hip joint was identified under fluoroscopy. The skin overlying the
left hip joint was subsequently cleaned with Betadine and a sterile
drape was placed over the area of interest. 2 ml 1% Lidocaine was
used to anesthetize the skin around the needle insertion site.

A 22 gauge spinal needle was inserted into the left hip joint under
fluoroscopy.

6 ml of gadolinium mixture (0.1 ml of Multihance mixed with 15 ml of
Isovue-M 200 contrast and 5 ml of sterile saline) were injected into
the left hip joint. There was significant resistance to injection
and the patient experienced significant pain during the injection.

The needle was removed and hemostasis was achieved. The patient was
subsequently transferred to MRI for imaging.
IMPRESSION: 1. Technically successful left hip injection for MRI.
2. Significant resistance to and pain with injection, suggestive of
adhesive capsulitis.

## 2021-02-14 IMAGING — MR MR HIP*L* W/CM
4 of 6 series · 16 of 40 positions shown · IV contrast (agent unspecified)
Comparison: X-ray [DATE]

CLINICAL DATA: Left hip pain since [REDACTED]

EXAM:
MRI OF THE LEFT HIP WITH CONTRAST (MR Arthrogram)
TECHNIQUE: Multiplanar, multisequence MR imaging of the hip was performed
immediately following contrast injection into the hip joint under
fluoroscopic guidance. No intravenous contrast was administered.

[Series 3: T1 · coronal · 4.0mm · 0.53mm/px · 6 of 24 slices shown]
[im 1/24]
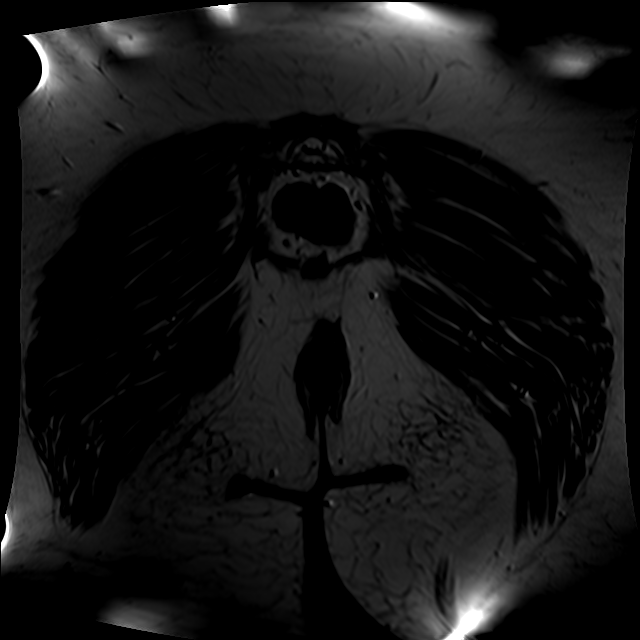
[im 5/24]
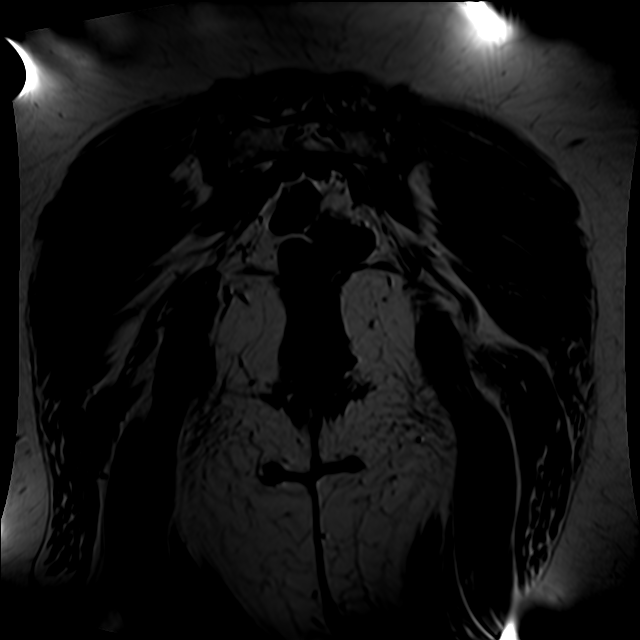
[im 10/24]
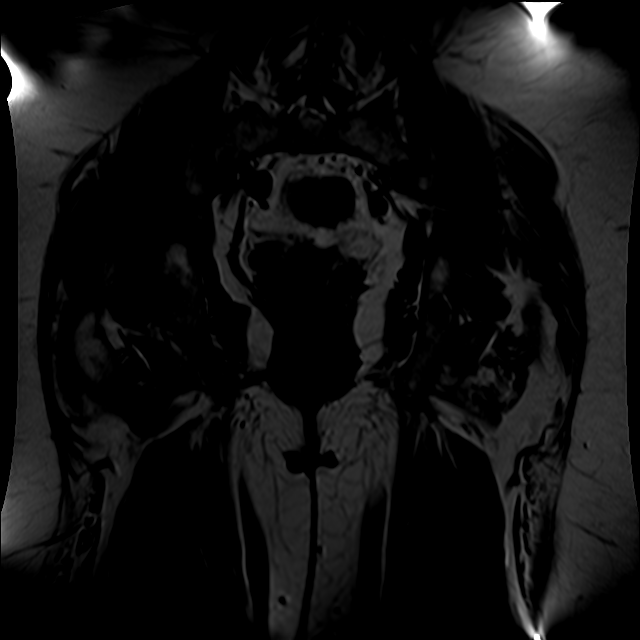
[im 14/24]
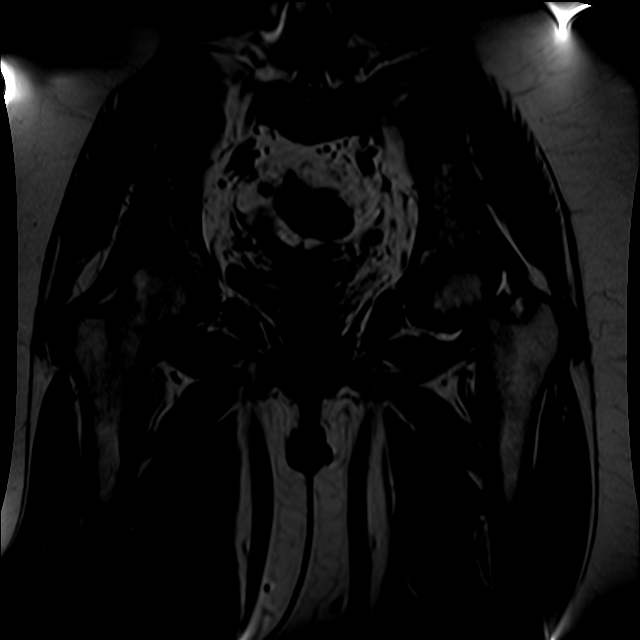
[im 19/24]
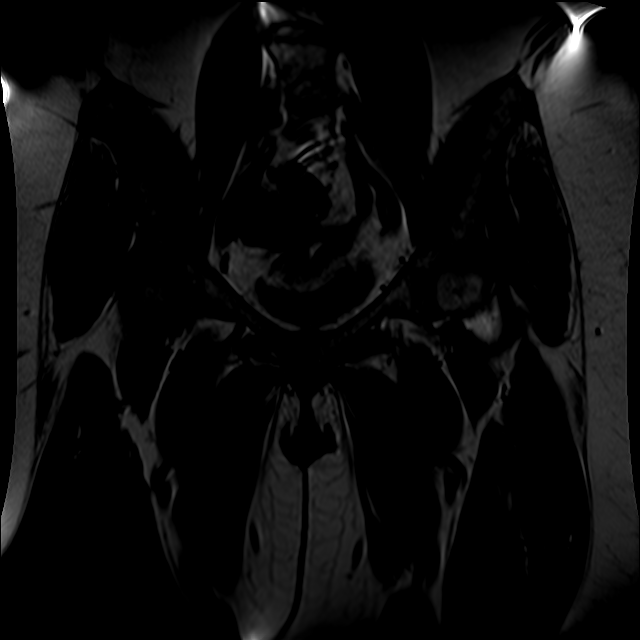
[im 24/24]
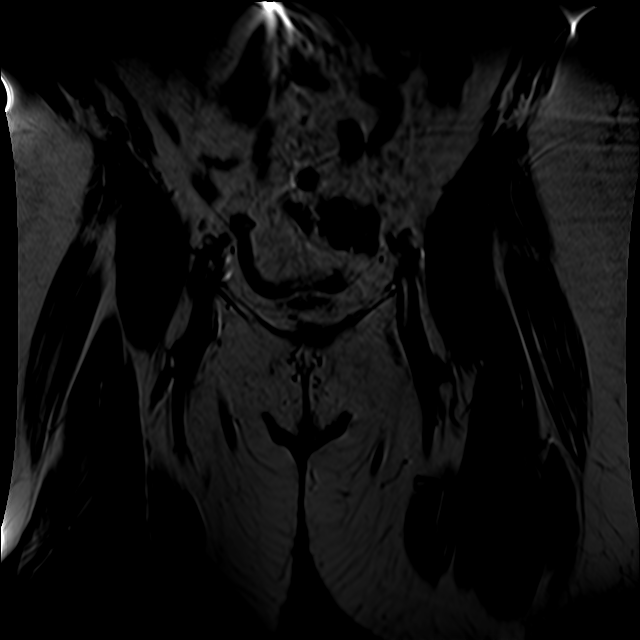

[Series 4: T2 fat-sat · coronal · 4.0mm · 0.53mm/px · 4 of 24 slices shown (1 of 2)]
[im 1/24]
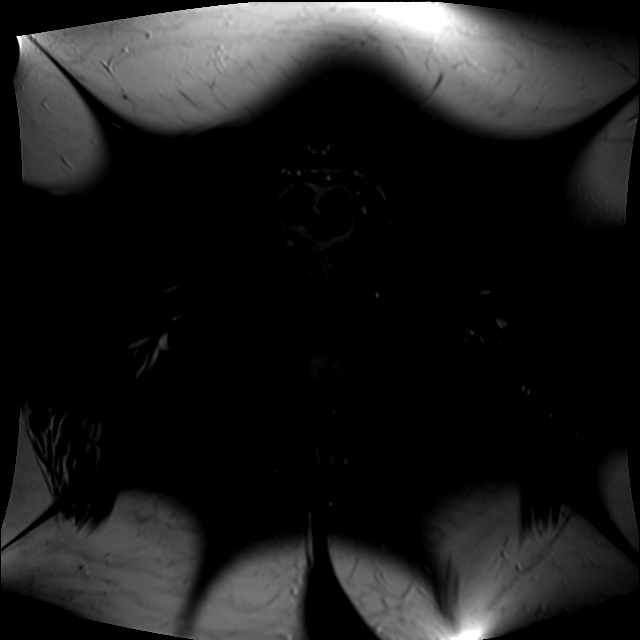
[im 4/24]
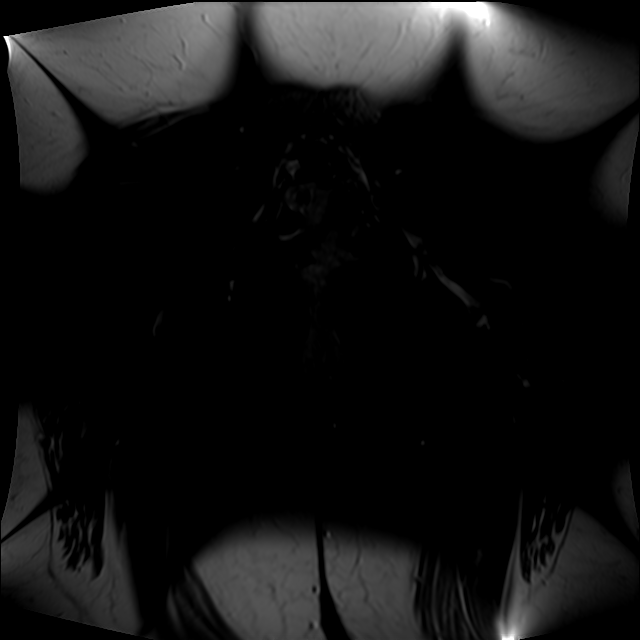
[im 12/24]
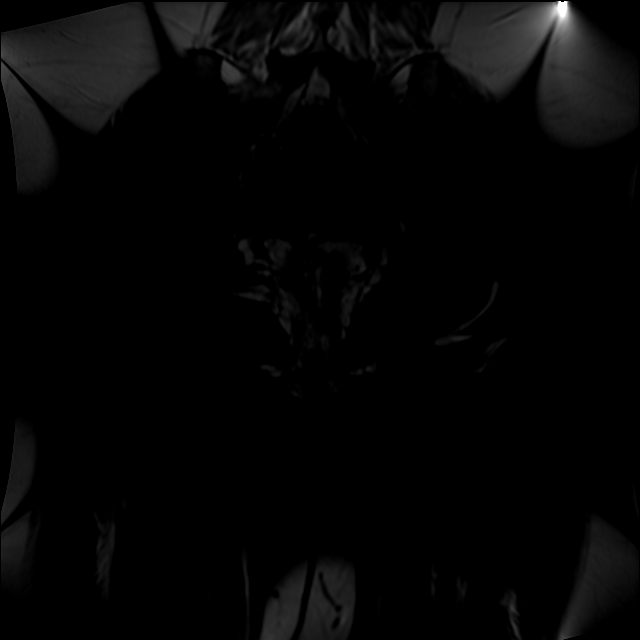
[im 20/24]
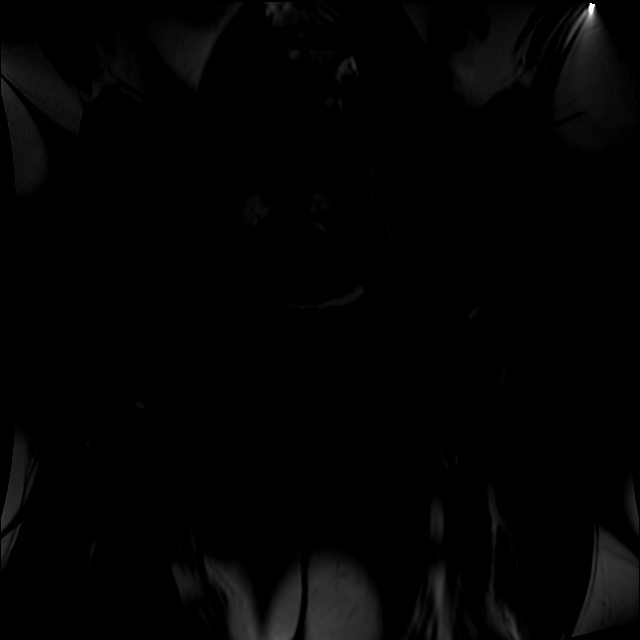

[Series 5: T2 fat-sat · axial · 4.0mm · 0.70mm/px · z∈[-48,+52]mm · 3 of 29 slices shown (2 of 2)]
[im 5/29]
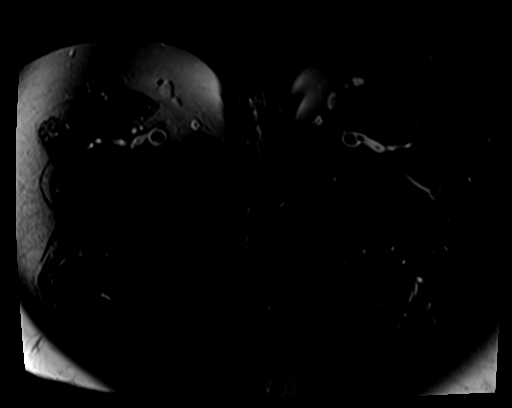
[im 17/29]
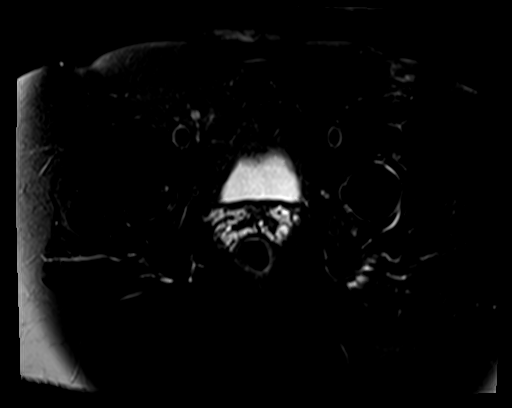
[im 25/29]
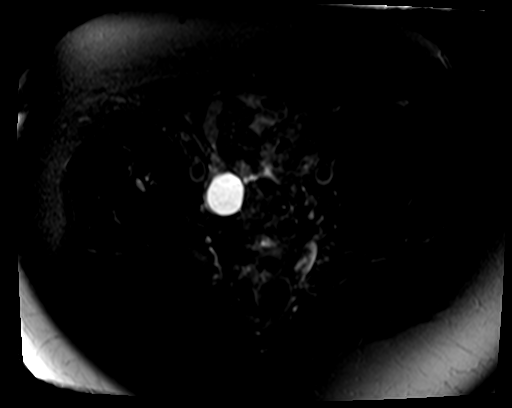

[Series 6: T1 fat-sat · axial · 4.0mm · 0.70mm/px · z∈[+29,+104]mm · 3 of 22 slices shown]
[im 5/22]
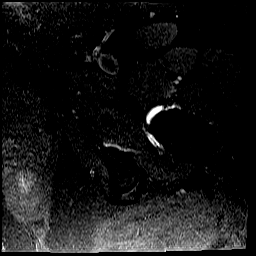
[im 13/22]
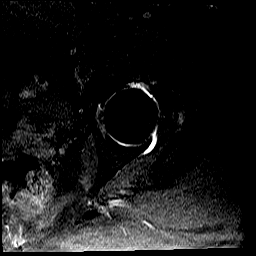
[im 22/22]
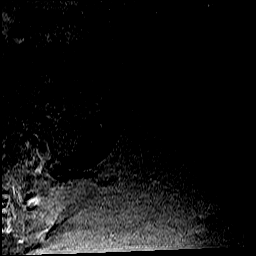

[16 of 40 positions shown; findings below may reference images not displayed]

FINDINGS: Technical Note: Despite efforts by the technologist and patient,
motion artifact is present on today's exam and could not be
eliminated. This reduces exam sensitivity and specificity.

Moderate

Bones: No acute fracture. No dislocation. No femoral head avascular
necrosis. Osteoarthritis of the right hip with diffuse chondral
thinning, marginal osteophyte formation, and prominent subchondral
cystic changes within the superior acetabulum. Bony pelvis intact
without diastasis. Mild arthropathy of the SI joints and pubic
symphysis. No extra-articular bone marrow edema. No marrow replacing
bone lesion.

Articular cartilage and labrum

Articular cartilage: Moderate diffuse cartilage thinning throughout
the left hip joint. Small femoral head marginal osteophytes.
Degenerative 1.6 cm subchondral cyst at the posterior acetabulum.

Labrum: Degenerative fraying of the labrum most pronounced at the
posterosuperior aspect. No paralabral cyst.

Joint or bursal effusion

Joint effusion: Joint is adequately distended with injected
contrast. No intra-articular loose body.

Bursae: Mild bilateral peritrochanteric bursal edema.

Muscles and tendons

Muscles and tendons: The gluteal, hamstring, iliopsoas, rectus
femoris, and adductor tendons appear intact without tear or
significant tendinosis. Normal muscle bulk and signal intensity
without edema, atrophy, or fatty infiltration.

Other findings

Miscellaneous: No soft tissue edema or fluid collection. No inguinal
lymphadenopathy. Simple cyst in the right adnexum measuring 3.0 x
2.5 x 3.0 cm. Diverticular changes of the sigmoid colon.
IMPRESSION: 1. Moderate osteoarthritis of the bilateral hips. No acute findings.
2. Mild bilateral peritrochanteric bursal edema.
3. Simple 3.0 cm right adnexal cyst. No follow-up imaging
recommended. Note: This recommendation does not apply to
premenarchal patients and to those with increased risk (genetic,
family history, elevated tumor markers or other high-risk factors)
of ovarian cancer. Reference: JACR [DATE]):[id number]
4. Sigmoid diverticulosis.

## 2021-02-14 MED ORDER — IOPAMIDOL (ISOVUE-M 200) INJECTION 41%
6.0000 mL | Freq: Once | INTRAMUSCULAR | Status: AC
  Administered 2021-02-14: 6 mL via INTRA_ARTICULAR

## 2021-02-14 NOTE — Telephone Encounter (Signed)
Yes it will

## 2021-02-14 NOTE — Telephone Encounter (Signed)
Spoke to patient and informed.

## 2021-02-15 NOTE — Progress Notes (Signed)
MRI arthrogram of the left hip shows medium arthritis.  This is probably the main source of your pain.  Your options are to return to clinic with me to discuss the results of the MRI in full detail as well as other treatment options or I can refer you to orthopedic surgery to discuss potential hip replacement option.  Please let me know what you would like.

## 2021-02-16 ENCOUNTER — Other Ambulatory Visit: Payer: Self-pay | Admitting: Family Medicine

## 2021-02-16 ENCOUNTER — Encounter: Payer: Self-pay | Admitting: Family Medicine

## 2021-02-17 ENCOUNTER — Other Ambulatory Visit: Payer: Self-pay

## 2021-02-17 DIAGNOSIS — M25552 Pain in left hip: Secondary | ICD-10-CM

## 2021-02-25 DIAGNOSIS — M1610 Unilateral primary osteoarthritis, unspecified hip: Secondary | ICD-10-CM | POA: Diagnosis not present

## 2021-02-28 DIAGNOSIS — N83201 Unspecified ovarian cyst, right side: Secondary | ICD-10-CM | POA: Diagnosis not present

## 2021-03-02 ENCOUNTER — Encounter: Payer: Self-pay | Admitting: Family Medicine

## 2021-03-02 ENCOUNTER — Other Ambulatory Visit: Payer: Self-pay

## 2021-03-02 MED ORDER — LINACLOTIDE 72 MCG PO CAPS: 72.0000 ug | ORAL_CAPSULE | Freq: Every day | ORAL | 0 refills | Status: DC

## 2021-03-02 NOTE — Telephone Encounter (Signed)
Refill sent to [facility] for patient; pt called and lvm to return my call to schedule an appt.  ?

## 2021-03-02 NOTE — Telephone Encounter (Signed)
Please advise 

## 2021-03-02 NOTE — Telephone Encounter (Signed)
.. ?  Encourage patient to contact the pharmacy for refills or they can request refills through [facility] ? ?LAST APPOINTMENT DATE:  03/19/20 ? ?NEXT APPOINTMENT DATE: N/A ? ?MEDICATION:linaclotide (LINZESS) 72 MCG capsule ? ?Is the patient out of medication? yes ? ?PHARMACY: ?[facility] # [place], [place] Phone:  [phone]  ?Fax:  [phone]  ?  ? ? ?Let patient know to contact pharmacy at the end of the day to make sure medication is ready. ? ?Please notify patient to allow 48-72 hours to process  ?

## 2021-03-04 ENCOUNTER — Other Ambulatory Visit: Payer: Self-pay | Admitting: Family Medicine

## 2021-03-04 DIAGNOSIS — I10 Essential (primary) hypertension: Secondary | ICD-10-CM

## 2021-03-22 ENCOUNTER — Other Ambulatory Visit: Payer: Self-pay | Admitting: Family Medicine

## 2021-03-22 DIAGNOSIS — I10 Essential (primary) hypertension: Secondary | ICD-10-CM

## 2021-03-28 DIAGNOSIS — E119 Type 2 diabetes mellitus without complications: Secondary | ICD-10-CM | POA: Diagnosis not present

## 2021-03-28 DIAGNOSIS — E890 Postprocedural hypothyroidism: Secondary | ICD-10-CM | POA: Diagnosis not present

## 2021-03-28 LAB — TSH: TSH: 3.4 (ref 0.41–5.90)

## 2021-04-01 DIAGNOSIS — M1612 Unilateral primary osteoarthritis, left hip: Secondary | ICD-10-CM | POA: Diagnosis not present

## 2021-04-01 DIAGNOSIS — M7062 Trochanteric bursitis, left hip: Secondary | ICD-10-CM | POA: Diagnosis not present

## 2021-04-05 ENCOUNTER — Telehealth: Payer: Self-pay | Admitting: Family Medicine

## 2021-04-05 NOTE — Telephone Encounter (Signed)
Pt is requesting to transfer care to Dr [name] due to her husband is already a pt of his. Please advise ?

## 2021-04-07 NOTE — Telephone Encounter (Signed)
Pt is asking if Dr [name] has considered her request.  ?

## 2021-04-07 NOTE — Telephone Encounter (Signed)
Yes that is OK. ? ?Diane Alexander. [name], MD ?04/07/2021 10:09 AM  ? ?

## 2021-04-07 NOTE — Telephone Encounter (Signed)
Ok to schedule TOC with Dr [name]  ?

## 2021-04-12 ENCOUNTER — Ambulatory Visit: Payer: BC Managed Care – PPO | Admitting: Family Medicine

## 2021-04-12 ENCOUNTER — Encounter: Payer: Self-pay | Admitting: Family Medicine

## 2021-04-12 VITALS — BP 125/85 | HR 67 | Temp 97.9°F | Ht 64.0 in | Wt 224.2 lb

## 2021-04-12 DIAGNOSIS — M160 Bilateral primary osteoarthritis of hip: Secondary | ICD-10-CM

## 2021-04-12 DIAGNOSIS — E890 Postprocedural hypothyroidism: Secondary | ICD-10-CM

## 2021-04-12 DIAGNOSIS — I10 Essential (primary) hypertension: Secondary | ICD-10-CM

## 2021-04-12 DIAGNOSIS — Z13220 Encounter for screening for lipoid disorders: Secondary | ICD-10-CM | POA: Diagnosis not present

## 2021-04-12 DIAGNOSIS — R739 Hyperglycemia, unspecified: Secondary | ICD-10-CM

## 2021-04-12 DIAGNOSIS — J301 Allergic rhinitis due to pollen: Secondary | ICD-10-CM | POA: Diagnosis not present

## 2021-04-12 DIAGNOSIS — N951 Menopausal and female climacteric states: Secondary | ICD-10-CM

## 2021-04-12 LAB — CBC
HCT: 38 % (ref 36.0–46.0)
Hemoglobin: 12.5 g/dL (ref 12.0–15.0)
MCHC: 32.9 g/dL (ref 30.0–36.0)
MCV: 85 fl (ref 78.0–100.0)
Platelets: 220 10*3/uL (ref 150.0–400.0)
RBC: 4.46 Mil/uL (ref 3.87–5.11)
RDW: 13.9 % (ref 11.5–15.5)
WBC: 4.2 10*3/uL (ref 4.0–10.5)

## 2021-04-12 LAB — LIPID PANEL
Cholesterol: 156 mg/dL (ref 0–200)
HDL: 60.4 mg/dL (ref >39.00)
LDL Cholesterol: 81 mg/dL (ref 0–99)
NonHDL: 95.93
Total CHOL/HDL Ratio: 3
Triglycerides: 76 mg/dL (ref 0.0–149.0)
VLDL: 15.2 mg/dL (ref 0.0–40.0)

## 2021-04-12 LAB — COMPREHENSIVE METABOLIC PANEL
ALT: 29 U/L (ref 0–35)
AST: 23 U/L (ref 0–37)
Albumin: 4 g/dL (ref 3.5–5.2)
Alkaline Phosphatase: 53 U/L (ref 39–117)
BUN: 13 mg/dL (ref 6–23)
CO2: 30 mEq/L (ref 19–32)
Calcium: 9.5 mg/dL (ref 8.4–10.5)
Chloride: 99 mEq/L (ref 96–112)
Creatinine, Ser: 0.67 mg/dL (ref 0.40–1.20)
GFR: 94.04 mL/min (ref >60.00)
Glucose, Bld: 104 mg/dL — ABNORMAL HIGH (ref 70–99)
Potassium: 3.2 mEq/L — ABNORMAL LOW (ref 3.5–5.1)
Sodium: 139 mEq/L (ref 135–145)
Total Bilirubin: 0.5 mg/dL (ref 0.2–1.2)
Total Protein: 7.4 g/dL (ref 6.0–8.3)

## 2021-04-12 LAB — TSH: TSH: 1.42 u[IU]/mL (ref 0.35–5.50)

## 2021-04-12 LAB — HEMOGLOBIN A1C: Hgb A1c MFr Bld: 5.8 % (ref 4.6–6.5)

## 2021-04-12 NOTE — Patient Instructions (Signed)
It was very nice to see you today! ? ?We will check blood work today. ? ?We may start you on Ozempic depending on the results. ? ?Please try switching your Zyrtec to Allegra or Xyzal. ? ?We will see you back in 3 months. Come back sooner if needed.  ?Take care, ?Dr [name] ? ?PLEASE NOTE: ? ?If you had any lab tests please let us know if you have not heard back within a few days. You may see your results on mychart before we have a chance to review them but we will give you a call once they are reviewed by [place]. If we ordered any referrals today, please let us know if you have not heard from their office within the next week.  ? ?Please try these tips to maintain a healthy lifestyle: ? ?Eat at least 3 REAL meals and 1-2 snacks per day.  Aim for no more than 5 hours between eating.  If you eat breakfast, please do so within one hour of getting up.  ? ?Each meal should contain half fruits/vegetables, one quarter protein, and one quarter carbs (no bigger than a computer mouse) ? ?Cut down on sweet beverages. This includes juice, soda, and sweet tea.  ? ?Drink at least 1 glass of water with each meal and aim for at least 8 glasses per day ? ?Exercise at least 150 minutes every week.   ?

## 2021-04-12 NOTE — Progress Notes (Signed)
? ?Diane Alexander is a 61 y.o. female who presents today for an office visit. ? ?Assessment/Plan:  ?Chronic Problems Addressed Today: ?Bilateral primary osteoarthritis of hip ?This is causing a significant mount of pain and impacting her quality of life and activities of daily living.  She will continue management per orthopedics.  May be getting cortisone injections in the future. ? ?Not clear if her rash is due to the Celebrex or not.  She will be switching her allergy med as below.  If she continues to have itchy rash with Celebrex she will need to stop it. ? ?Hypothyroidism following RAI 12/2016, on Levothyroxine ?Continue management per endocrinology.  She is on Synthroid 112 mcg daily.  We will check TSH today. ? ?Vasomotor symptoms due to menopause ?On estradiol replacement per gynecology. ? ?Seasonal allergic rhinitis due to pollen ?Recommended switching Zyrtec to Xyzal or Allegra to see if this works better. ? ?Essential hypertension ?At goal today on losartan-HCTZ 100-25 once daily and amlodipine 5 mg daily. ? ?Hyperglycemia ?Check A1c today.  We will likely start Ozempic if elevated. ? ?She will follow up with me in 3 months.  ? ?  ?Subjective:  ?HPI: ? ?She is here to transfer to care.  See A/P for status of chronic conditions. ? ?She still have issue with left hip pain. Symptoms got worse since July  2022. She was unable to walk. Had workup in the past which showed arthritis of bilateral hip. She notes her symptoms started to get worse since receiving Covid booster. She had steroid injection in the past with no improvement. She was started on Celebrex 200 mg 2 times daily. This has helped. She notes her pain has improved with the Celebrex. However, she has developed rash. Some itching. She has tried lotion with some  improvement. She has been taking Zyrtec which did not seems to help. Denies swelling.  ? ?She is also concerned about weight gain. She notes she has not been eating much. She is not sure  what is causing her gained weight She notes she has been trying to eat less. She is drinking protein drinks in the mornings. However, she is unable to lose weight. She has not been exercising due to her hip pain. She is concern that this might be related to blood sugar. Usually has high blood sugar. She would like to get blood work done.  ? ?PMH: ? ?The following were reviewed and entered/updated in epic: ?Past Medical History:  ?Diagnosis Date  ? Allergy   ? Bilateral primary osteoarthritis of hip 05/22/2019  ? Constipation   ? Lumbar arthropathy   ? ?Patient Active Problem List  ? Diagnosis Date Noted  ? Hemangioma of vertebral body 09/09/2020  ? DDD (degenerative disc disease), lumbar 03/19/2020  ? Bilateral primary osteoarthritis of hip 05/22/2019  ? Hormone replacement therapy (HRT) 11/22/2018  ? Chronic idiopathic constipation 05/23/2018  ? Diverticulosis 05/23/2018  ? Hypothyroidism following RAI 12/2016, on Levothyroxine 05/20/2018  ? Plantar fascia syndrome 02/09/2018  ? Vasomotor symptoms due to menopause 10/31/2017  ? Obesity (BMI 30-39.9) 02/02/2017  ? Essential hypertension 02/02/2017  ? Seasonal allergic rhinitis due to pollen 02/02/2017  ? Arthralgia of multiple joints, with negative Rheum panel 02/02/2017  ? Vitamin D deficiency, taking 2000 IU daily 02/02/2017  ? Lactose intolerance 02/02/2017  ? ?Past Surgical History:  ?Procedure Laterality Date  ? BREAST CYST EXCISION    ? CHOLECYSTECTOMY    ? VAGINAL HYSTERECTOMY    ? ? ?Family  History  ?Problem Relation Age of Onset  ? Lymphoma Sister 10  ? Heart disease Mother   ? Heart attack Father   ? Cancer Maternal Grandmother   ? Thyroid disease Neg Hx   ? ? ?Medications- reviewed and updated ?Current Outpatient Medications  ?Medication Sig Dispense Refill  ? amLODipine (NORVASC) 5 MG tablet Take 1 tablet (5 mg total) by mouth daily. 90 tablet 1  ? calcium-vitamin D (OSCAL WITH D) 500-200 MG-UNIT tablet Take 1 tablet by mouth.    ? celecoxib (CELEBREX)  200 MG capsule Take 200 mg by mouth 2 (two) times daily.    ? diclofenac (VOLTAREN) 75 MG EC tablet Take 1 tablet by mouth twice daily 30 tablet 0  ? diclofenac sodium (VOLTAREN) 1 % GEL Apply 2 g topically 4 (four) times daily. 100 g 1  ? estradiol (ESTRACE) 2 MG tablet Take one 2 mg tablet by mouth daily 90 tablet 0  ? furosemide (LASIX) 20 MG tablet Take 1 tablet (20 mg total) by mouth daily as needed for edema. 30 tablet 2  ? Lactase (LACTAID PO) Take by mouth.    ? levothyroxine (SYNTHROID) 112 MCG tablet Take 1 tablet (112 mcg total) by mouth daily before breakfast. 30 tablet 5  ? linaclotide (LINZESS) 72 MCG capsule Take 1 capsule (72 mcg total) by mouth daily before breakfast. 90 capsule 0  ? LORazepam (ATIVAN) 0.5 MG tablet 1-2 tabs 30 - 60 min prior to MRI. Do not drive with this medicine. 4 tablet 0  ? losartan-hydrochlorothiazide (HYZAAR) 100-25 MG tablet TAKE ONE TABLET BY MOUTH ONE TIME DAILY 90 tablet 0  ? potassium chloride SA (KLOR-CON) 20 MEQ tablet Take 1 tablet (20 mEq total) by mouth daily. 90 tablet 3  ? VITAMIN D PO 1 tablet    ? calcium carbonate (TUMS - DOSED IN MG ELEMENTAL CALCIUM) 500 MG chewable tablet Chew 1 tablet by mouth as needed.  (Patient not taking: Reported on 04/12/2021)    ? ?No current facility-administered medications for this visit.  ? ? ?Allergies-reviewed and updated ?No Known Allergies ? ?Social History  ? ?Socioeconomic History  ? Marital status: Married  ?  Spouse name: Not on file  ? Number of children: Not on file  ? Years of education: Not on file  ? Highest education level: Not on file  ?Occupational History  ?  Employer: [employer]  ?Tobacco Use  ? Smoking status: Never  ? Smokeless tobacco: Never  ?Vaping Use  ? Vaping Use: Never used  ?Substance and Sexual Activity  ? Alcohol use: No  ? Drug use: No  ? Sexual activity: Not on file  ?Other Topics Concern  ? Not on file  ?Social History Narrative  ? Not on file  ? ?Social Determinants  of Health  ? ?Financial Resource Strain: Not on file  ?Food Insecurity: Not on file  ?Transportation Needs: Not on file  ?Physical Activity: Not on file  ?Stress: Not on file  ?Social Connections: Not on file  ? ? ? ? ?   ?  ?Objective:  ?Physical Exam: ?BP 125/85 (BP Location: Left Arm)   Pulse 67   Temp 97.9 ?F (36.6 ?C) (Temporal)   Ht '5\' 4"'$  (1.626 m)   Wt 224 lb 3.2 oz (101.7 kg)   LMP  (LMP Unknown)   SpO2 100%   BMI 38.48 kg/m?   ?Gen: No acute distress, resting comfortably ?Neuro: Grossly normal, moves all extremities ?Psych:  Normal affect and thought content ? ?   ? ? ?I,Savera Zaman,acting as a Education administrator for Dimas Chyle, MD.,have documented all relevant documentation on the behalf of Dimas Chyle, MD,as directed by  Dimas Chyle, MD while in the presence of Dimas Chyle, MD.  ? ?I, Dimas Chyle, MD, have reviewed all documentation for this visit. The documentation on 04/12/21 for the exam, diagnosis, procedures, and orders are all accurate and complete. ? ?Time Spent: ?45 minutes of total time was spent on the date of the encounter performing the following actions: chart review prior to seeing the patient including recent visits with specialist and previous PCP, obtaining history, performing a medically necessary exam, counseling on the treatment plan, placing orders, and documenting in our EHR.  ? ? ?Algis Greenhouse. [name], MD ?04/12/2021 11:23 AM  ? ?

## 2021-04-12 NOTE — Assessment & Plan Note (Signed)
On estradiol replacement per gynecology. ?

## 2021-04-12 NOTE — Assessment & Plan Note (Signed)
Continue management per endocrinology.  She is on Synthroid 112 mcg daily.  We will check TSH today. ?

## 2021-04-12 NOTE — Assessment & Plan Note (Signed)
At goal today on losartan-HCTZ 100-25 once daily and amlodipine 5 mg daily. ?

## 2021-04-12 NOTE — Assessment & Plan Note (Signed)
Recommended switching Zyrtec to Xyzal or Allegra to see if this works better. ?

## 2021-04-12 NOTE — Assessment & Plan Note (Addendum)
This is causing a significant mount of pain and impacting her quality of life and activities of daily living.  She will continue management per orthopedics.  May be getting cortisone injections in the future. ? ?Not clear if her rash is due to the Celebrex or not.  She will be switching her allergy med as below.  If she continues to have itchy rash with Celebrex she will need to stop it. ?

## 2021-04-13 NOTE — Progress Notes (Signed)
Please inform patient of the following: ? ?Her A1c is in the borderline diabetic range.  Would like for her to start Ozempic 0.25 mg weekly.  Please send in a prescription.  She should send [place] a message in a few weeks to let me know how this is going.  We can recheck this in 3 months. ? ?Potassium is little bit low.  This is probably due to her fluid pill.  Please make sure that she is taking her potassium supplement that is on her medication list.  We can recheck this again in a few weeks. ? ?Everything else is stable.

## 2021-04-14 ENCOUNTER — Other Ambulatory Visit: Payer: Self-pay | Admitting: *Deleted

## 2021-04-14 ENCOUNTER — Encounter: Payer: Self-pay | Admitting: Family Medicine

## 2021-04-14 ENCOUNTER — Telehealth: Payer: Self-pay | Admitting: Family Medicine

## 2021-04-14 DIAGNOSIS — M25552 Pain in left hip: Secondary | ICD-10-CM | POA: Insufficient documentation

## 2021-04-14 DIAGNOSIS — E876 Hypokalemia: Secondary | ICD-10-CM

## 2021-04-14 MED ORDER — OZEMPIC (0.25 OR 0.5 MG/DOSE) 2 MG/3ML ~~LOC~~ SOPN: 0.2500 mg | PEN_INJECTOR | SUBCUTANEOUS | 1 refills | Status: DC

## 2021-04-14 NOTE — Telephone Encounter (Signed)
Pt called stating Prior authorization is needed from ins for Rx.  ? ?As of 11:06 am on 04/14/21, [facility] had not sent over the needed information. ? ?When it arrives it will be handled with the daily procedures and can take 24-48 business hours to reach the provider.  ?

## 2021-04-14 NOTE — Telephone Encounter (Signed)
Pt is returning a call regarding her labs. Asking for a call back. ?

## 2021-04-14 NOTE — Telephone Encounter (Signed)
See results note. 

## 2021-04-14 NOTE — Telephone Encounter (Signed)
Diane Alexander (Key: [id number]) ?Rx #: [id number] ?Ozempic (0.25 or 0.5 MG/DOSE) '2MG'$ /3ML pen-injectors ?

## 2021-04-15 DIAGNOSIS — M25552 Pain in left hip: Secondary | ICD-10-CM | POA: Diagnosis not present

## 2021-04-15 DIAGNOSIS — M1612 Unilateral primary osteoarthritis, left hip: Secondary | ICD-10-CM | POA: Diagnosis not present

## 2021-04-16 DIAGNOSIS — M1612 Unilateral primary osteoarthritis, left hip: Secondary | ICD-10-CM | POA: Insufficient documentation

## 2021-04-17 ENCOUNTER — Encounter: Payer: Self-pay | Admitting: Family Medicine

## 2021-04-18 ENCOUNTER — Other Ambulatory Visit: Payer: Self-pay | Admitting: Family Medicine

## 2021-04-18 ENCOUNTER — Telehealth: Payer: Self-pay | Admitting: Family Medicine

## 2021-04-18 ENCOUNTER — Other Ambulatory Visit: Payer: Self-pay

## 2021-04-18 ENCOUNTER — Telehealth: Payer: Self-pay

## 2021-04-18 DIAGNOSIS — I10 Essential (primary) hypertension: Secondary | ICD-10-CM

## 2021-04-18 MED ORDER — AMLODIPINE BESYLATE 5 MG PO TABS: 5.0000 mg | ORAL_TABLET | Freq: Every day | ORAL | 1 refills | Status: DC

## 2021-04-18 NOTE — Telephone Encounter (Signed)
"  [facility] refusing to fill" ? ?.. ?Encourage patient to contact the pharmacy for refills or they can request refills through [facility] ? ?LAST APPOINTMENT DATE:  ?04/12/21 ? ?NEXT APPOINTMENT DATE: ?07/18/21 ? ?MEDICATION: ?amLODipine (NORVASC) 5 MG tablet [[id number]]  ? ? ?Is the patient out of medication?  ?Yes ? ?PHARMACY: ?[facility] [address]  ?[place], [place] [address]  ?Phone:  [phone]  Fax:  [phone] ? ?Let patient know to contact pharmacy at the end of the day to make sure medication is ready. ? ?Please notify patient to allow 48-72 hours to process  ?

## 2021-04-18 NOTE — Telephone Encounter (Signed)
Called pt verified DOB, advised pt we would be more than happy to appeal the decision however she still may to check with her insurance to see if there are alternatives that they would approve. This is her responsibility to check with her insurance for covered medications. Pt verbalized understanding  ?

## 2021-04-18 NOTE — Telephone Encounter (Signed)
Please see note; patient has been contacted and advised of denial and why it was denied.  ?

## 2021-04-18 NOTE — Telephone Encounter (Signed)
Rx sent to [facility] pharmacy.  

## 2021-04-18 NOTE — Telephone Encounter (Signed)
Beverlyn Swaim (Key: [id number]) ?Rx #: [id number] ?Ozempic (0.25 or 0.5 MG/DOSE) '2MG'$ /3ML pen-injectors ? ?PA was denied contacted pt to advise to contact insurance to check on alternatives they would approve  ?

## 2021-04-18 NOTE — Telephone Encounter (Signed)
Spoke with pt via Mychart message and phone call, pt has bene advised  ?

## 2021-04-18 NOTE — Telephone Encounter (Signed)
Patient called back very upset that she was asked to contact her insurance for alternatives to Ozempic since it was denied by her insurance. Explained to pt that some insurance carriers won't approve if not being diabetic and this was prescribed according to her last office note since A1C was in prediabetic range. Patient is wanting to know is there anything that can be done or used that would be approved. If she is not currently diabetic why would this even be prescribed to her. Advised earlier this morning during our phone conversation that we could send appeal as she requested per her MyChart message.  ?

## 2021-04-18 NOTE — Telephone Encounter (Signed)
Pt is asking for a call back. She states it should be our responsibility to make sure her medication is ordered and appealed. She also states we should be contacting the insurance to find out what alternatives they would cover. She states she works and cannot do it. ?

## 2021-04-18 NOTE — Telephone Encounter (Signed)
It is the patients responsibility to know what is covered and what is not covered.  Her A1c has been in diabetic range in the past and most recently was in the prediabetic range.  This is why we started Ozempic. ? ? ?

## 2021-04-19 NOTE — Telephone Encounter (Signed)
We can try resubmitting it with a type 2 diabetes diagnosis.  She would technically meet this definition due to her elevated A1c from a couple years ago. ?

## 2021-04-21 NOTE — Telephone Encounter (Signed)
(  Key: [id number]) ?Ozempic (0.25 or 0.5 MG/DOSE) '2MG'$ /3ML pen-injectors ?Resend with E11.9  ?

## 2021-04-22 NOTE — Telephone Encounter (Signed)
We can try metformin xr '500mg'$  daily instead if she wishes. ? ?Algis Greenhouse. [name], MD ?04/22/2021 12:16 PM  ? ?

## 2021-04-22 NOTE — Telephone Encounter (Signed)
Spoke with patient advise to call pharmacy for Ozempic refills  ?

## 2021-04-22 NOTE — Telephone Encounter (Signed)
Patient states our office was to have started a PA for ozempic.  States she has not heard back in regard.   Patient wants to know if metformin can be used in place of ozempic?  Patient is requesting a call back at [phone]

## 2021-04-22 NOTE — Telephone Encounter (Signed)
This request has been approved using information available on the patient's profile. CaseId:[id number];Status:Approved;Review Type:Prior Auth;Coverage Start Date:03/22/2021;Coverage End Date:04/21/2022  ?Pharmacy notified  ?

## 2021-04-22 NOTE — Telephone Encounter (Signed)
Please see note and advise; Ozempic was denied and pt was advised of denial.  ?

## 2021-04-25 ENCOUNTER — Encounter: Payer: Self-pay | Admitting: Family Medicine

## 2021-04-25 ENCOUNTER — Ambulatory Visit: Payer: BC Managed Care – PPO | Admitting: Family Medicine

## 2021-04-25 VITALS — BP 119/84 | HR 72 | Temp 97.3°F | Ht 64.0 in | Wt 218.6 lb

## 2021-04-25 DIAGNOSIS — E1169 Type 2 diabetes mellitus with other specified complication: Secondary | ICD-10-CM | POA: Diagnosis not present

## 2021-04-25 DIAGNOSIS — I10 Essential (primary) hypertension: Secondary | ICD-10-CM | POA: Diagnosis not present

## 2021-04-25 DIAGNOSIS — E669 Obesity, unspecified: Secondary | ICD-10-CM | POA: Diagnosis not present

## 2021-04-25 DIAGNOSIS — E890 Postprocedural hypothyroidism: Secondary | ICD-10-CM

## 2021-04-25 DIAGNOSIS — J301 Allergic rhinitis due to pollen: Secondary | ICD-10-CM

## 2021-04-25 MED ORDER — AMOXICILLIN-POT CLAVULANATE 875-125 MG PO TABS: 1.0000 | ORAL_TABLET | Freq: Two times a day (BID) | ORAL | 0 refills | Status: DC

## 2021-04-25 MED ORDER — POTASSIUM CHLORIDE 20 MEQ PO PACK: 20.0000 meq | PACK | Freq: Two times a day (BID) | ORAL | 3 refills | Status: DC

## 2021-04-25 MED ORDER — AZELASTINE HCL 0.1 % NA SOLN: 2.0000 | Freq: Two times a day (BID) | NASAL | 12 refills | Status: DC

## 2021-04-25 NOTE — Assessment & Plan Note (Signed)
Worsened recently.  Likely contributing to her above sinus infection.  We will add on Astelin.  She can use this year-round as needed. ?

## 2021-04-25 NOTE — Progress Notes (Signed)
? ?  Diane Alexander is a 61 y.o. female who presents today for an office visit. ? ?Assessment/Plan:  ?New/Acute Problems: ?Sinusitis ?No red flags.  Given length of symptoms will start Augmentin.  Also start Astelin nasal spray.  Encourage good hydration.  She can use over-the-counter meds as needed.  Discussed reasons to return to care.  Follow-up as needed. ? ?Chronic Problems Addressed Today: ?Hypothyroidism following RAI 12/2016, on Levothyroxine ?Continue management per endocrinology.  She is on Synthroid 112 mcg daily. ? ?Seasonal allergic rhinitis due to pollen ?Worsened recently.  Likely contributing to her above sinus infection.  We will add on Astelin.  She can use this year-round as needed. ? ?Obesity (BMI 30-39.9) ?She is down about 3 pounds since her last visit.  She is waiting on insurance approval and pharmacy to get Ozempic in stock.  She has not yet started this.  She will follow-up with me in a few months.  We will place referral to nutritionist. ? ?Type 2 diabetes mellitus with other specified complication ([place]) ?Hopefully will be able to start [place] [facility] soon.  She has been doing reasonably well with dietary modification.  We briefly discussed diet today.  We will place referral to nutrition soon ? ?  ?Subjective:  ?HPI: ? ?Patient here with sinus congestion. Started about a week ago. Tried taking OTC which helped some. No fevers or chills. Some body aches. Increased fatigue. No sick contacts.  ? ?See A/P for status of chronic conditions. ? ?   ?  ?Objective:  ?Physical Exam: ?BP 119/84   Pulse 72   Temp (!) 97.3 ?F (36.3 ?C) (Temporal)   Ht '5\' 4"'$  (1.626 m)   Wt 218 lb 9.6 oz (99.2 kg)   LMP  (LMP Unknown)   SpO2 100%   BMI 37.52 kg/m?   ?Gen: No acute distress, resting comfortably ?HEENT: TMs with clear effusion.  Right TM slightly erythematous.  Nasal mucosa erythematous and boggy with clear discharge.  Tonsillar hypertrophy noted bilaterally. ?CV: Regular rate and rhythm with no murmurs  appreciated ?Pulm: Normal work of breathing, clear to auscultation bilaterally with no crackles, wheezes, or rhonchi ?Neuro: Grossly normal, moves all extremities ?Psych: Normal affect and thought content ? ?   ? ?Algis Greenhouse. [name], MD ?04/25/2021 12:22 PM  ?

## 2021-04-25 NOTE — Assessment & Plan Note (Signed)
Continue management per endocrinology.  She is on Synthroid 112 mcg daily. ?

## 2021-04-25 NOTE — Assessment & Plan Note (Signed)
Hopefully will be able to start [place] soon.  She has been doing reasonably well with dietary modification.  We briefly discussed diet today.  We will place referral to nutrition soon ?

## 2021-04-25 NOTE — Patient Instructions (Signed)
It was very nice to see you today! ? ?I think you have a sinus infection.  Please start the Augmentin and nasal spray. ? ?I will send a new prescription for your potassium. ? ?I will refer you to a nutritionist. ? ?Please come back in 1 to 2 weeks to recheck potassium. ? ?Take care, ?Dr [name] ? ?PLEASE NOTE: ? ?If you had any lab tests please let us know if you have not heard back within a few days. You may see your results on mychart before we have a chance to review them but we will give you a call once they are reviewed by [place]. If we ordered any referrals today, please let us know if you have not heard from their office within the next week.  ? ?Please try these tips to maintain a healthy lifestyle: ? ?Eat at least 3 REAL meals and 1-2 snacks per day.  Aim for no more than 5 hours between eating.  If you eat breakfast, please do so within one hour of getting up.  ? ?Each meal should contain half fruits/vegetables, one quarter protein, and one quarter carbs (no bigger than a computer mouse) ? ?Cut down on sweet beverages. This includes juice, soda, and sweet tea.  ? ?Drink at least 1 glass of water with each meal and aim for at least 8 glasses per day ? ?Exercise at least 150 minutes every week.   ?

## 2021-04-25 NOTE — Assessment & Plan Note (Addendum)
She is down about 3 pounds since her last visit.  She is waiting on insurance approval and pharmacy to get Ozempic in stock.  She has not yet started this.  She will follow-up with me in a few months.  We will place referral to nutritionist. ?

## 2021-04-26 ENCOUNTER — Encounter: Payer: Self-pay | Admitting: Family Medicine

## 2021-04-29 ENCOUNTER — Telehealth: Payer: Self-pay

## 2021-04-29 NOTE — Telephone Encounter (Signed)
Spoke with patient, patient will come on Tuesday for teaching how to use Ozempic Inj  ? ?

## 2021-04-29 NOTE — Telephone Encounter (Signed)
Patient would like to know if she could meet with the assistant today to show her how to use ozempic pen.

## 2021-05-02 ENCOUNTER — Other Ambulatory Visit (INDEPENDENT_AMBULATORY_CARE_PROVIDER_SITE_OTHER): Payer: BC Managed Care – PPO

## 2021-05-02 DIAGNOSIS — E876 Hypokalemia: Secondary | ICD-10-CM | POA: Diagnosis not present

## 2021-05-02 LAB — COMPREHENSIVE METABOLIC PANEL
ALT: 21 U/L (ref 0–35)
AST: 20 U/L (ref 0–37)
Albumin: 4.1 g/dL (ref 3.5–5.2)
Alkaline Phosphatase: 50 U/L (ref 39–117)
BUN: 16 mg/dL (ref 6–23)
CO2: 31 mEq/L (ref 19–32)
Calcium: 9.4 mg/dL (ref 8.4–10.5)
Chloride: 99 mEq/L (ref 96–112)
Creatinine, Ser: 0.81 mg/dL (ref 0.40–1.20)
GFR: 78.07 mL/min (ref >60.00)
Glucose, Bld: 123 mg/dL — ABNORMAL HIGH (ref 70–99)
Potassium: 3.5 mEq/L (ref 3.5–5.1)
Sodium: 138 mEq/L (ref 135–145)
Total Bilirubin: 0.4 mg/dL (ref 0.2–1.2)
Total Protein: 7.8 g/dL (ref 6.0–8.3)

## 2021-05-03 NOTE — Progress Notes (Signed)
Please inform patient of the following: ? ?Good news! Potassium is back to normal. Do not need to do any further testing at this point.  We can recheck when she comes back in for her next office visit.

## 2021-05-04 DIAGNOSIS — M25552 Pain in left hip: Secondary | ICD-10-CM | POA: Diagnosis not present

## 2021-05-11 NOTE — Telephone Encounter (Signed)
Fatigue is not common with ozempic. Recommend she come in for office visit if this persists. ? ?Algis Greenhouse. [name], MD ?05/11/2021 11:25 AM  ? ?

## 2021-05-11 NOTE — Telephone Encounter (Signed)
See note

## 2021-05-11 NOTE — Telephone Encounter (Signed)
Patient aware, will schedule appointment if symptoms persists  ?

## 2021-05-11 NOTE — Telephone Encounter (Signed)
Pt states feeling exhaustion. Pt is wondering if this is a common Ozempic side effect.  ? ?Pt states the glucose readings "keep coming back with an E3 reading". ? ?Pt plans to go to the pharmacy to see if the blood reading device is defective.  ? ?Pt would like a call back. ?

## 2021-05-13 DIAGNOSIS — Z6835 Body mass index (BMI) 35.0-35.9, adult: Secondary | ICD-10-CM | POA: Diagnosis not present

## 2021-05-13 DIAGNOSIS — Z01419 Encounter for gynecological examination (general) (routine) without abnormal findings: Secondary | ICD-10-CM | POA: Diagnosis not present

## 2021-05-13 DIAGNOSIS — Z1231 Encounter for screening mammogram for malignant neoplasm of breast: Secondary | ICD-10-CM | POA: Diagnosis not present

## 2021-05-26 ENCOUNTER — Encounter: Payer: Self-pay | Admitting: Family Medicine

## 2021-05-26 NOTE — Telephone Encounter (Signed)
Pt called to speak to PCP assistant about this message.  Pt is requesting a call back ASAP.

## 2021-05-26 NOTE — Telephone Encounter (Signed)
93 is a good fasting blood sugar. Anything between 70 and 100 is the goal.   Anything less than 180 after eating a meal is good.   It is ok for her to be on both medications.  Algis Greenhouse. [name], MD 05/26/2021 3:26 PM

## 2021-05-26 NOTE — Telephone Encounter (Signed)
Please advise 

## 2021-06-02 ENCOUNTER — Ambulatory Visit: Payer: BC Managed Care – PPO | Admitting: Dietician

## 2021-06-08 ENCOUNTER — Telehealth: Payer: Self-pay | Admitting: Family Medicine

## 2021-06-08 ENCOUNTER — Other Ambulatory Visit: Payer: Self-pay

## 2021-06-08 ENCOUNTER — Emergency Department (HOSPITAL_COMMUNITY)
Admission: EM | Admit: 2021-06-08 | Discharge: 2021-06-08 | Disposition: A | Discharge status: Home / Self Care | Payer: BC Managed Care – PPO | Attending: Emergency Medicine | Admitting: Emergency Medicine

## 2021-06-08 DIAGNOSIS — Z79899 Other long term (current) drug therapy: Secondary | ICD-10-CM | POA: Insufficient documentation

## 2021-06-08 DIAGNOSIS — E876 Hypokalemia: Secondary | ICD-10-CM | POA: Diagnosis not present

## 2021-06-08 DIAGNOSIS — J0100 Acute maxillary sinusitis, unspecified: Secondary | ICD-10-CM | POA: Diagnosis not present

## 2021-06-08 DIAGNOSIS — E119 Type 2 diabetes mellitus without complications: Secondary | ICD-10-CM | POA: Diagnosis not present

## 2021-06-08 DIAGNOSIS — R200 Anesthesia of skin: Secondary | ICD-10-CM | POA: Diagnosis not present

## 2021-06-08 DIAGNOSIS — E039 Hypothyroidism, unspecified: Secondary | ICD-10-CM | POA: Diagnosis not present

## 2021-06-08 DIAGNOSIS — R202 Paresthesia of skin: Secondary | ICD-10-CM | POA: Insufficient documentation

## 2021-06-08 DIAGNOSIS — R0789 Other chest pain: Secondary | ICD-10-CM | POA: Insufficient documentation

## 2021-06-08 DIAGNOSIS — I10 Essential (primary) hypertension: Secondary | ICD-10-CM | POA: Insufficient documentation

## 2021-06-08 LAB — BASIC METABOLIC PANEL
Anion gap: 9 (ref 5–15)
BUN: 10 mg/dL (ref 8–23)
CO2: 28 mmol/L (ref 22–32)
Calcium: 9.4 mg/dL (ref 8.9–10.3)
Chloride: 102 mmol/L (ref 98–111)
Creatinine, Ser: 0.83 mg/dL (ref 0.44–1.00)
GFR, Estimated: >60 mL/min (ref >60)
Glucose, Bld: 103 mg/dL — ABNORMAL HIGH (ref 70–99)
Potassium: 3 mmol/L — ABNORMAL LOW (ref 3.5–5.1)
Sodium: 139 mmol/L (ref 135–145)

## 2021-06-08 LAB — MAGNESIUM: Magnesium: 2 mg/dL (ref 1.7–2.4)

## 2021-06-08 LAB — CBC
HCT: 41 % (ref 36.0–46.0)
Hemoglobin: 13 g/dL (ref 12.0–15.0)
MCH: 27.5 pg (ref 26.0–34.0)
MCHC: 31.7 g/dL (ref 30.0–36.0)
MCV: 86.7 fL (ref 80.0–100.0)
Platelets: 295 10*3/uL (ref 150–400)
RBC: 4.73 MIL/uL (ref 3.87–5.11)
RDW: 14.8 % (ref 11.5–15.5)
WBC: 5.3 10*3/uL (ref 4.0–10.5)
nRBC: 0 % (ref 0.0–0.2)

## 2021-06-08 LAB — TROPONIN I (HIGH SENSITIVITY): Troponin I (High Sensitivity): 3 ng/L (ref <18)

## 2021-06-08 MED ORDER — POTASSIUM CHLORIDE CRYS ER 20 MEQ PO TBCR
40.0000 meq | EXTENDED_RELEASE_TABLET | Freq: Once | ORAL | Status: AC
  Administered 2021-06-08: 40 meq via ORAL
  Filled 2021-06-08: qty 2

## 2021-06-08 NOTE — Telephone Encounter (Signed)
PCP Triage Nurse states pt is refusing to call 911 or go to ED, as directed for possible stroke symptoms.  Pt warm transferred to [place] Rep.  Pt restated she does not want to go to emergency department due to finances. Does not understand why a primary care physician will not see her to determine that this is not a heart issue or gas.  Pt states she cannot afford a "$250 bill" to go to the Emergency department. FO Rep offered to set up communication to [facility] financial assistance department. Pt declined.  Pt states the ED will end up assessing her and send her back to the PCP for follow up, does not understand the policies for this situation.  Pt requests FO rep to ask provider to see her in office.   Awaiting PCP triage nurse follow up notes.

## 2021-06-08 NOTE — ED Notes (Signed)
ED Provider at bedside. 

## 2021-06-08 NOTE — ED Provider Notes (Signed)
[facility] EMERGENCY DEPARTMENT Provider Note   CSN: [id number] Arrival date & time: 06/08/21  1050     History  Chief Complaint  Patient presents with   facial numbness   left side heaviness    Diane Alexander is a 61 y.o. female.  Patient with history of hypertension, hypothyroidism, hormone replacement therapy, diabetes --presents to the emergency department today for evaluation of chest pain and facial "numbness".  Symptoms started yesterday.  Patient states that she began feeling decreased sensation in her left jaw area as well as pressure in her left cheek.  Soon after, she was anxious, and developed a tightness in her left upper chest into her left shoulder.  She was concerned that this could be her heart.  But she also states that she has issues with gas and she took a Gas-X which allowed her to pass some gas.  She states that symptoms temporarily improved but then returned.  She was able to calm and sleep last night.  She awoke this morning and try to go to work but felt "weird".  She describes this as feeling "swimmy headed".  She states that she was told to come to the emergency department due to symptoms on the left side of her body.  The numbness is a decreased sensation or unusual sensation and not a full numbness like if she went to the dentist office.  Patient denies signs other of stroke including: facial droop, slurred speech, aphasia, weakness/numbness in extremities, imbalance/trouble walking.  No changes in taste or trouble with fluids staying in her mouth while drinking.  She denies dental pain or abscesses.  She has had some nasal congestion and is concerned about having a sinus infection, as she has these frequently.  She does have a nasal spray which she uses.       Home Medications Prior to Admission medications   Medication Sig Start Date End Date Taking? Authorizing Provider  amLODipine (NORVASC) 5 MG tablet Take 1 tablet (5 mg total) by mouth  daily. 04/18/21   Vivi Barrack, MD  amoxicillin-clavulanate (AUGMENTIN) 875-125 MG tablet Take 1 tablet by mouth 2 (two) times daily. 04/25/21   Vivi Barrack, MD  azelastine (ASTELIN) 0.1 % nasal spray Place 2 sprays into both nostrils 2 (two) times daily. 04/25/21   Vivi Barrack, MD  calcium carbonate (TUMS - DOSED IN MG ELEMENTAL CALCIUM) 500 MG chewable tablet Chew 1 tablet by mouth as needed.    [provider]  calcium-vitamin D (OSCAL WITH D) 500-200 MG-UNIT tablet Take 1 tablet by mouth.    [provider]  diclofenac (VOLTAREN) 75 MG EC tablet Take 1 tablet by mouth twice daily 02/17/21   Leamon Arnt, MD  diclofenac sodium (VOLTAREN) 1 % GEL Apply 2 g topically 4 (four) times daily. 08/21/18   Burchette, Alinda Sierras, MD  estradiol (ESTRACE) 2 MG tablet Take one 2 mg tablet by mouth daily 06/05/19   Leamon Arnt, MD  furosemide (LASIX) 20 MG tablet Take 1 tablet (20 mg total) by mouth daily as needed for edema. 02/09/20   Leamon Arnt, MD  Lactase (LACTAID PO) Take by mouth.    [provider]  levothyroxine (SYNTHROID) 112 MCG tablet Take 1 tablet (112 mcg total) by mouth daily before breakfast. 08/11/20   Leamon Arnt, MD  linaclotide [facility]) 72 MCG capsule Take 1 capsule (72 mcg total) by mouth daily before breakfast. 03/02/21   Billey Chang  L, MD  LORazepam (ATIVAN) 0.5 MG tablet 1-2 tabs 30 - 60 min prior to MRI. Do not drive with this medicine. 08/16/20   Gregor Hams, MD  losartan-hydrochlorothiazide [facility]) 100-25 MG tablet TAKE ONE TABLET BY MOUTH ONE TIME DAILY 03/07/21   Leamon Arnt, MD  potassium chloride (KLOR-CON) 20 MEQ packet Take 20 mEq by mouth 2 (two) times daily. 04/25/21   Vivi Barrack, MD  VITAMIN D PO 1 tablet    [provider]  potassium chloride SA (KLOR-CON) 20 MEQ tablet Take 1 tablet (20 mEq total) by mouth daily. 02/19/20 04/25/21  Leamon Arnt, MD      Allergies    Codeine    Review of Systems   Review of  Systems  Physical Exam Updated Vital Signs BP 126/81   Pulse 62   Temp 97.8 F (36.6 C) (Oral)   Resp 16   LMP  (LMP Unknown)   SpO2 100%  Physical Exam Vitals and nursing note reviewed.  Constitutional:      Appearance: She is well-developed. She is not diaphoretic.  HENT:     Head: Normocephalic and atraumatic.     Right Ear: Tympanic membrane, ear canal and external ear normal.     Left Ear: Tympanic membrane, ear canal and external ear normal.     Nose: No nasal deformity.     Right Nostril: No occlusion.     Left Nostril: No occlusion.     Right Turbinates: Not swollen or pale.     Left Turbinates: Swollen. Not pale.     Right Sinus: No maxillary sinus tenderness or frontal sinus tenderness.     Left Sinus: Maxillary sinus tenderness present. No frontal sinus tenderness.     Comments: Patient reports pain with palpation over the left maxillary sinus.  When I do this, she states that she feels pain in her teeth as well.    Mouth/Throat:     Mouth: Mucous membranes are not dry.     Pharynx: Uvula midline.  Eyes:     General: Lids are normal.     Extraocular Movements:     Right eye: No nystagmus.     Left eye: No nystagmus.     Conjunctiva/sclera: Conjunctivae normal.     Pupils: Pupils are equal, round, and reactive to light.  Neck:     Vascular: Normal carotid pulses. No JVD.     Trachea: Trachea normal. No tracheal deviation.  Cardiovascular:     Rate and Rhythm: Normal rate and regular rhythm.     Pulses: No decreased pulses.          Radial pulses are 2+ on the right side and 2+ on the left side.     Heart sounds: Normal heart sounds, S1 normal and S2 normal. No murmur heard. Pulmonary:     Effort: Pulmonary effort is normal. No respiratory distress.     Breath sounds: Normal breath sounds. No wheezing.  Chest:     Chest wall: No tenderness.  Abdominal:     General: Bowel sounds are normal.     Palpations: Abdomen is soft.     Tenderness: There is no  abdominal tenderness. There is no guarding or rebound.  Musculoskeletal:        General: Normal range of motion.     Cervical back: Normal range of motion and neck supple. No tenderness or bony tenderness. No muscular tenderness.  Skin:    General: Skin is warm  and dry.     Coloration: Skin is not pale.  Neurological:     Mental Status: She is alert and oriented to person, place, and time.     GCS: GCS eye subscore is 4. GCS verbal subscore is 5. GCS motor subscore is 6.     Cranial Nerves: Cranial nerve deficit (sensation only as detailed) present. No dysarthria or facial asymmetry.     Sensory: No sensory deficit.     Motor: No weakness.     Coordination: Coordination normal.     Gait: Gait normal.     Comments: No facial weakness detected.  Patient with symmetrical smile.  She reports decreased sensation to light touch over the left-sided portion of her chin and maxilla area.  Upper extremity myotomes tested bilaterally:  C5 Shoulder abduction 5/5 C6 Elbow flexion/wrist extension 5/5 C7 Elbow extension 5/5 C8 Finger flexion 5/5 T1 Finger abduction 5/5  Lower extremity myotomes tested bilaterally: L2 Hip flexion 5/5 L3 Knee extension 5/5 L4 Ankle dorsiflexion 5/5 S1 Ankle plantar flexion 5/5     ED Results / Procedures / Treatments   Labs (all labs ordered are listed, but only abnormal results are displayed) Labs Reviewed  BASIC METABOLIC PANEL - Abnormal; Notable for the following components:      Result Value   Potassium 3.0 (*)    Glucose, Bld 103 (*)    All other components within normal limits  CBC  MAGNESIUM  TROPONIN I (HIGH SENSITIVITY)  TROPONIN I (HIGH SENSITIVITY)    ED ECG REPORT   Date: 06/08/2021  Rate: 72  Rhythm: normal sinus rhythm  QRS Axis: normal  Intervals: normal  ST/T Wave abnormalities: normal  Conduction Disutrbances:none  Narrative Interpretation:   Old EKG Reviewed: unchanged 04/2019  I have personally reviewed the EKG tracing and  agree with the computerized printout as noted.   Radiology No results found.  Procedures Procedures    Medications Ordered in ED Medications - No data to display  ED Course/ Medical Decision Making/ A&P    Patient seen and examined. History obtained directly from patient.  Based on my exam and patient wishes, I have low concern that she is having an acute stroke.  Patient also voices the desire to minimize work-up today.  She most strongly feels that her facial symptoms are related to her sinuses and that her chest pain is related to gas and anxiety.  This very well could be the case, and I encourage patient to stay for completion of blood work including troponin.  We reviewed her EKG at bedside which is normal.  Labs/EKG: CBC, BMP, troponin, magnesium.  Labs were all personally reviewed and interpreted.  BMP shows potassium 3.0.  Imaging: None ordered.  Low concern for acute stroke.  Patient declines further imaging.  Medications/Fluids: Ordered: Potassium repletion  Most recent vital signs reviewed and are as follows: BP 126/81   Pulse 62   Temp 97.8 F (36.6 C) (Oral)   Resp 16   LMP  (LMP Unknown)   SpO2 100%   Initial impression: Facial pressure and decreased sensation, intermittent chest tightness.  12:58 PM Reassessment performed. Patient appears stable, unchanged exam.  Reviewed pertinent lab work and imaging with patient at bedside. Questions answered.   Most current vital signs reviewed and are as follows: BP 119/74   Pulse 60   Temp 97.8 F (36.6 C) (Oral)   Resp 16   LMP  (LMP Unknown)   SpO2 100%   Plan: Discharge  to home.   Prescriptions written for: None  Other home care instructions discussed: She will continue her home nasal spray and restart Zyrtec which she stopped for several days.  She will monitor closely for changing or worsening symptoms.  ED return instructions discussed: Patient counseled to return if they have weakness in their arms or  legs, slurred speech, trouble walking or talking, confusion, trouble with their balance, or if they have any other concerns. Patient verbalizes understanding and agrees with plan.   Return and follow-up instructions: I encouraged patient to return to ED with severe chest pain, especially if the pain is crushing or pressure-like and spreads to the arms, back, neck, or jaw, or if they have associated sweating, vomiting, or shortness of breath with the pain, or significant pain with activity. We discussed that the evaluation here today indicates a low-risk of serious cause of chest pain, including heart trouble or a blood clot, but no evaluation is perfect and chest pain can evolve with time. The patient verbalized understanding and agreed.      Follow-up instructions discussed: Patient encouraged to follow-up with their PCP in 3 days.                            Medical Decision Making  In regards to the patient's facial paresthesias, she does have some symptoms which seem consistent with sinus pain and pressure.  No dental issues.  No facial weakness or other symptoms which would suggest a Bell's palsy.  She does not have any other symptoms that would suggest an acute stroke.  Patient declined head imaging in the department today.  Do not feel strongly she needs this based on her symptoms.  She will monitor closely and seems reliable to return if something does change or worsen.  For this patient's complaint of chest pain, the following emergent conditions were considered on the differential diagnosis: acute coronary syndrome, pulmonary embolism, pneumothorax, myocarditis, pericardial tamponade, aortic dissection, thoracic aortic aneurysm complication, esophageal perforation.   Other causes were also considered including: gastroesophageal reflux disease, musculoskeletal pain including costochondritis, pneumonia/pleurisy, herpes zoster, pericarditis.  In regards to possibility of ACS, patient has  atypical features of pain, non-ischemic and unchanged EKG and negative troponin(s). Heart score was calculated to be 3.   In regards to possibility of PE, symptoms are atypical for PE and risk profile is low, making PE low likelihood.   The patient's vital signs, pertinent lab work and imaging were reviewed and interpreted as discussed in the ED course. Hospitalization was considered for further testing, treatments, or serial exams/observation. However as patient is well-appearing, has a stable exam, and reassuring studies today, I do not feel that they warrant admission at this time. This plan was discussed with the patient who verbalizes agreement and comfort with this plan and seems reliable and able to return to the Emergency Department with worsening or changing symptoms.          Final Clinical Impression(s) / ED Diagnoses Final diagnoses:  Paresthesia  Chest tightness  Hypokalemia    Rx / DC Orders ED Discharge Orders     None         Carlisle Cater, PA-C 06/08/21 1301    Godfrey Pick, MD 06/08/21 1426

## 2021-06-08 NOTE — ED Provider Triage Note (Signed)
Emergency Medicine Provider Triage Evaluation Note  Diane Alexander , a 61 y.o. female  was evaluated in triage.  Pt complains of left-sided chest heaviness and left shoulder numbness since yesterday afternoon.  She took aspirin which did not relieve her symptoms.  No history of ACS or CVA.  Says that she also felt as though she was "talking weird".  Left-sided chest pain radiates through left arm  Review of Systems  Positive: Is above Negative: Difficulty breathing  Physical Exam  BP 138/88 (BP Location: Right Arm)   Pulse 85   Temp 97.8 F (36.6 C) (Oral)   Resp 16   LMP  (LMP Unknown)   SpO2 100%  Gen:   Awake, no distress   Resp:  Normal effort  MSK:   Moves extremities without difficulty  Other:  RRR, reports slightly decreased sensation to light touch on the left side of her face, mostly just above the mandible.  Cranial nerves II through XII intact  Medical Decision Making  Medically screening exam initiated at 11:39 AM.  Appropriate orders placed.  [name] was informed that the remainder of the evaluation will be completed by another provider, this initial triage assessment does not replace that evaluation, and the importance of remaining in the ED until their evaluation is complete.    I notified the patient that I wanted to do a head CT.  She stated that she did not want to do that and would wait for evaluation in the back.  She states being very concerned about the emergency department bill.  At this time she is refusing head CT and stroke work-up.  ACS work-up in process   Rhae Hammock, PA-C 06/08/21 1141

## 2021-06-08 NOTE — Telephone Encounter (Signed)
CALL EMS 911 NOW: * Immediate medical attention is needed. You need to hang up and call 911 (or an ambulance). CARE ADVICE given per Neurologic Deficit (Adult) guideline.  Patient Name: Diane Alexander Gender: Female DOB: 10-Jun-1960 Age: 61 Y 11 D Return Phone Number: [phone] (Primary), [phone] (Secondary) Address: City/ State/ Zip: [place]  [address] Client [facility] [place] [facility] [place] [facility] Contact Type Call Who Is Calling Patient / Member / Family / Caregiver Call Type Triage / Clinical Relationship To Patient Self Return Phone Number [phone] (Primary) Chief Complaint NUMBNESS/TINGLING- sudden on one side of the body or face Reason for Call Symptomatic / Request for [place] states that her jaw is numb and she has trapped gas in her body. Translation No Nurse Assessment Nurse: Radford Pax, RN, Eugene Garnet Date/Time (Eastern Time): 06/08/2021 9:58:58 AM Confirm and document reason for call. If symptomatic, describe symptoms. ---Numbness in jaw, only left side. Complains of pressure in abdomen and breast on left side. Sudden onset yesterday. Does the patient have any new or worsening symptoms? ---Yes Will a triage be completed? ---Yes Related visit to physician within the last 2 weeks? ---No Does the PT have any chronic conditions? (i.e. diabetes, asthma, this includes High risk factors for pregnancy, etc.) ---Yes List chronic conditions. ---HTN, ozempic Is this a behavioral health or substance abuse call? ---No Guidelines Guideline Title Affirmed Question Affirmed Notes Nurse Date/Time (Eastern Time) Neurologic Deficit [1] Numbness (i.e., loss of sensation) of the face, arm / hand, or leg / foot on one side of the body AND [2] sudden onset AND [3] present now  Disp. Time Eilene Ghazi Time) Disposition Final User 06/08/2021 9:56:58 AM Send to Urgent Queue  Windy Canny 06/08/2021 10:13:33 AM 911 Outcome Documentation Turner, RN, Eugene Garnet Reason: Adamantly refuses to call EMS or go to ED. Wants to be seen by PCP or Cardiology today. Willing to see any provider at [facility]. 06/08/2021 10:10:36 AM Call EMS 911 Now Yes Turner, RN, Eugene Garnet Caller Disagree/Comply Disagree Caller Understands Yes PreDisposition Call Doctor Care Advice Given Per Guideline CALL EMS 911 NOW: * Immediate medical attention is needed. You need to hang up and call 911 (or an ambulance). CARE ADVICE given per Neurologic Deficit (Adult) guideline. Comments User: Susie Cassette, RN Date/Time Eilene Ghazi Time): 06/08/2021 10:14:25 AM Pt wants to stay on hold, unable to disconnect. Notified Raquel Sarna in office of pt condition. Was able to warm transfer to office for assistance

## 2021-06-08 NOTE — ED Triage Notes (Signed)
Pt. Stated, Diane Alexander had some left facial numbness since yesterday but the left side of me feels heavy like it doesn't belong. I feel weird in my chest. Alert and oriented, x 4 No weakness, no arm drift, symmetrical smile,

## 2021-06-08 NOTE — Telephone Encounter (Signed)
Pt called asking for a visit due to left jaw numbness and feeling of gas on left side of body. Pt expressed concern that something could be going on with her heart. Sent pt to triage for further evaluation.

## 2021-06-08 NOTE — Discharge Instructions (Signed)
Please read and follow all provided instructions.  Your diagnoses today include:  1. Paresthesia   2. Chest tightness   3. Hypokalemia     Tests performed today include: An EKG of your heart A chest x-ray Cardiac enzymes - a blood test for heart muscle damage Blood counts and electrolytes - your blood work showed low potassium Vital signs. See below for your results today.   Medications prescribed:  None  Take any prescribed medications only as directed.  Follow-up instructions: Please follow-up with your primary care provider for further evaluation of your symptoms.   Return instructions:  SEEK IMMEDIATE MEDICAL ATTENTION IF: You have severe chest pain, especially if the pain is crushing or pressure-like and spreads to the arms, back, neck, or jaw, or if you have sweating, nausea or vomiting, or trouble with breathing. THIS IS AN EMERGENCY. Do not wait to see if the pain will go away. Get medical help at once. Call 911. DO NOT drive yourself to the hospital.  Your chest pain gets worse and does not go away after a few minutes of rest.  You have an attack of chest pain lasting longer than what you usually experience.  You have significant dizziness, if you pass out, or have trouble walking.  You have chest pain not typical of your usual pain for which you originally saw your caregiver.  You have any other emergent concerns regarding your health.  Additional Information: Chest pain comes from many different causes. Your caregiver has diagnosed you as having chest pain that is not specific for one problem, but does not require admission.  You are at low risk for an acute heart condition or other serious illness.   Your vital signs today were: BP 119/74   Pulse 60   Temp 97.8 F (36.6 C) (Oral)   Resp 16   LMP  (LMP Unknown)   SpO2 100%  If your blood pressure (BP) was elevated above 135/85 this visit, please have this repeated by your doctor within one  month. --------------

## 2021-06-08 NOTE — Telephone Encounter (Signed)
Patient at ED now  °

## 2021-06-08 NOTE — Telephone Encounter (Signed)
Spoke to pt, communicated that Dr. Esther Hardy concurred with determination of going to ED after reviewing pt's chart and PCP Triage nurse notes.  Pt stated understanding and disconnected the call.

## 2021-06-15 ENCOUNTER — Encounter: Payer: Self-pay | Admitting: Family Medicine

## 2021-06-17 MED ORDER — POTASSIUM CHLORIDE CRYS ER 20 MEQ PO TBCR: 20.0000 meq | EXTENDED_RELEASE_TABLET | Freq: Every day | ORAL | 1 refills | Status: DC

## 2021-06-21 DIAGNOSIS — K5730 Diverticulosis of large intestine without perforation or abscess without bleeding: Secondary | ICD-10-CM | POA: Diagnosis not present

## 2021-06-21 DIAGNOSIS — K635 Polyp of colon: Secondary | ICD-10-CM | POA: Diagnosis not present

## 2021-06-21 DIAGNOSIS — Z86010 Personal history of colonic polyps: Secondary | ICD-10-CM | POA: Diagnosis not present

## 2021-06-21 DIAGNOSIS — Z1211 Encounter for screening for malignant neoplasm of colon: Secondary | ICD-10-CM | POA: Diagnosis not present

## 2021-06-21 DIAGNOSIS — E119 Type 2 diabetes mellitus without complications: Secondary | ICD-10-CM | POA: Diagnosis not present

## 2021-06-21 DIAGNOSIS — K648 Other hemorrhoids: Secondary | ICD-10-CM | POA: Diagnosis not present

## 2021-06-21 DIAGNOSIS — D123 Benign neoplasm of transverse colon: Secondary | ICD-10-CM | POA: Diagnosis not present

## 2021-06-22 ENCOUNTER — Telehealth: Payer: Self-pay | Admitting: Family Medicine

## 2021-06-22 ENCOUNTER — Other Ambulatory Visit: Payer: Self-pay | Admitting: Family Medicine

## 2021-06-22 DIAGNOSIS — I10 Essential (primary) hypertension: Secondary | ICD-10-CM

## 2021-06-22 NOTE — Telephone Encounter (Signed)
Patient really wants to know why she has to keep calling for refills. Patient demands a call back at ph# [phone].    Encourage patient to contact the pharmacy for refills or they can request refills through [place]:  04/25/21  NEXT APPOINTMENT DATE: 07/11/21  MEDICATION:losartan-hydrochlorothiazide (HYZAAR) 100-25 MG tablet  Is the patient out of medication?  2 tablets left (2 days)  PHARMACY: [id number] - [place], [place]  Let patient know to contact pharmacy at the end of the day to make sure medication is ready.  Please notify patient to allow 48-72 hours to process

## 2021-06-23 ENCOUNTER — Other Ambulatory Visit: Payer: Self-pay

## 2021-06-23 DIAGNOSIS — I10 Essential (primary) hypertension: Secondary | ICD-10-CM

## 2021-06-23 MED ORDER — LOSARTAN POTASSIUM-HCTZ 100-25 MG PO TABS: 1.0000 | ORAL_TABLET | Freq: Every day | ORAL | 0 refills | Status: DC

## 2021-06-23 NOTE — Telephone Encounter (Signed)
Pt states she would like update about Rx.  FORep informed pt that RX was sent to [facility] at 8:30am 06/22.  Pt stated understanding and was going to call the pharmacy.

## 2021-06-23 NOTE — Telephone Encounter (Signed)
Rx sent to pharmacy   

## 2021-06-26 LAB — HM COLONOSCOPY

## 2021-06-29 ENCOUNTER — Encounter: Payer: Self-pay | Admitting: Family Medicine

## 2021-06-30 ENCOUNTER — Ambulatory Visit: Payer: BC Managed Care – PPO | Admitting: Family Medicine

## 2021-07-02 ENCOUNTER — Other Ambulatory Visit: Payer: Self-pay | Admitting: Family Medicine

## 2021-07-02 ENCOUNTER — Encounter: Payer: Self-pay | Admitting: Family Medicine

## 2021-07-08 ENCOUNTER — Ambulatory Visit: Payer: BC Managed Care – PPO | Admitting: Dietician

## 2021-07-11 ENCOUNTER — Encounter: Payer: Self-pay | Admitting: Family Medicine

## 2021-07-11 ENCOUNTER — Ambulatory Visit: Payer: BC Managed Care – PPO | Admitting: Family Medicine

## 2021-07-11 VITALS — BP 122/78 | HR 68 | Temp 97.2°F | Ht 64.0 in | Wt 213.0 lb

## 2021-07-11 DIAGNOSIS — I10 Essential (primary) hypertension: Secondary | ICD-10-CM

## 2021-07-11 DIAGNOSIS — E876 Hypokalemia: Secondary | ICD-10-CM

## 2021-07-11 DIAGNOSIS — J301 Allergic rhinitis due to pollen: Secondary | ICD-10-CM | POA: Diagnosis not present

## 2021-07-11 DIAGNOSIS — E1169 Type 2 diabetes mellitus with other specified complication: Secondary | ICD-10-CM | POA: Diagnosis not present

## 2021-07-11 MED ORDER — CARBAMAZEPINE ER 100 MG PO TB12: 100.0000 mg | ORAL_TABLET | Freq: Two times a day (BID) | ORAL | 5 refills | Status: DC

## 2021-07-11 NOTE — Patient Instructions (Addendum)
It was very nice to see you today!  We will check blood work today.  I think you have some inflammation in the nerve in your face called the trigeminal nerve.  Please try the Tegretol to see if this helps.  Take care, Dr [name]  PLEASE NOTE:  If you had any lab tests please let us know if you have not heard back within a few days. You may see your results on mychart before we have a chance to review them but we will give you a call once they are reviewed by [place]. If we ordered any referrals today, please let us know if you have not heard from their office within the next week.   Please try these tips to maintain a healthy lifestyle:  Eat at least 3 REAL meals and 1-2 snacks per day.  Aim for no more than 5 hours between eating.  If you eat breakfast, please do so within one hour of getting up.   Each meal should contain half fruits/vegetables, one quarter protein, and one quarter carbs (no bigger than a computer mouse)  Cut down on sweet beverages. This includes juice, soda, and sweet tea.   Drink at least 1 glass of water with each meal and aim for at least 8 glasses per day  Exercise at least 150 minutes every week.    Trigeminal Neuralgia  Trigeminal neuralgia is a nerve disorder that causes severe pain on one side of the face. The pain may last from a few seconds to several minutes, but it can happen hundreds of times a day. The pain is usually only on one side of the face. Symptoms may occur for days, weeks, or months and then go away for months or years. The pain may return and be worse than before. What are the causes? This condition may be caused by: Damage or pressure to a nerve in the head that is called the trigeminal nerve. An attack can be triggered by: Talking or chewing. Putting on makeup. Washing, shaving, or touching your face. Brushing your teeth. Blasts of hot or cold air. Primary demyelinating disorders, such as multiple sclerosis. Tumors. What increases the  risk? You are more likely to develop this condition if: You are 53-24 years old. You are female. What are the signs or symptoms? The main symptom of this condition is severe pain in the jaw, lips, eyes, nose, scalp, forehead, and face. How is this diagnosed? This condition is diagnosed with a physical exam. A CT scan or an MRI may be done to rule out other conditions that can cause facial pain. How is this treated? This condition may be treated with: Measures to avoid the things that trigger your symptoms. Prescription medicines such as anticonvulsants. Procedures such as ablation, thermal, or radiation therapy. Cognitive or behavioral therapy. Complementary therapies such as: Gentle, regular exercise or yoga. Meditation. Aromatherapy. Acupuncture. Surgery. This may be done in severe cases if other medical treatment does not provide relief. It may take up to one month for treatment to start relieving the pain. Follow these instructions at home: Managing pain  Learn as much as you can about how to manage your pain. Ask your health care provider if a pain specialist would be helpful. Consider talking with a mental health care provider about how to cope with the pain. Consider joining a pain support group. General instructions Take over-the-counter and prescription medicines only as told by your health care provider. Avoid the things that trigger your symptoms.  It may help to: Chew on the unaffected side of your mouth. Avoid touching your face. Avoid blasts of hot or cold air. Keep all follow-up visits. Where to find more information Facial Pain Association: facepain.org Contact a health care provider if: Your medicine is not helping your symptoms. You have side effects from the medicine used for treatment. You develop new, unexplained symptoms, such as: Double vision. Facial weakness or numbness. Changes in hearing or balance. You feel depressed. Get help right away  if: Your pain is severe and is not getting better. You develop suicidal thoughts. If you ever feel like you may hurt yourself or others, or have thoughts about taking your own life, get help right away. Go to your nearest emergency department or: Call your local emergency services (911 in the [place] Call a suicide crisis helpline, such as the [place] at 585 612 5648 or 988 in the [place]. This is open 24 hours a day in the [place] Text the Crisis Text Line at 8155520741 (in the [place].). Summary Trigeminal neuralgia is a nerve disorder that causes severe pain on one side of the face. The pain may last from a few seconds to several minutes. This condition is caused by damage or pressure to a nerve in the head that is called the trigeminal nerve. Treatment may include avoiding the things that trigger your symptoms, taking medicines, or having procedures or surgery. It may take up to one month for treatment to start relieving the pain. Keep all follow-up visits. This information is not intended to replace advice given to you by your health care provider. Make sure you discuss any questions you have with your health care provider. Document Revised: 07/15/2020 Document Reviewed: 06/14/2020 Elsevier Patient Education  [place].

## 2021-07-11 NOTE — Progress Notes (Signed)
Diane Alexander is a 61 y.o. female who presents today for an office visit.  Assessment/Plan:  New/Acute Problems: Hypokalemia She has had this potassium supplementation.  We will recheck labs today.  Facial Numbness Likely has mild trigeminal neuralgia.  Her area of numbness is predominantly in the V2 and V3 regions.  No red flag signs or symptoms.  Reassuring neuro exam today.  Could be sequela of COVID.  We discussed referral to neurology versus MRI however she deferred both.  We will empirically try Tegretol.  We discussed potential side effects.  She will follow-up in a couple weeks via [place].  Chronic Problems Addressed Today: Type 2 diabetes mellitus with other specified complication ([place]) She has lost about 11 pounds over the last 3 months.  Advised her to increase her Ozempic to 0.5 mg weekly.  We will check A1c today.  She is working on diet and exercise.  Seasonal allergic rhinitis due to pollen Could be contributing to her sinus infection.  Continue Astelin.  If this continues to be an issue would consider referral to ENT or allergist.  Essential hypertension Continue losartan-HCTZ 100-25 once daily and amlodipine 5 mg daily.     Subjective:  HPI:  Patient here for ED follow-up.  Went to the ED on 06/08/2021 with chest pain and facial numbness.  In the ED she stated that her symptoms that started the day prior.  She was concerned about possible heart issue.  In the ED she had labs and EKG which were reassuring.  She was incidentally found to have a potassium of 3.0 and was started on potassium supplementation.  He has completed this.  She is still having numbness and tingling on her left lower face and sometimes radiates into her chest.  Tingling is predominantly located on the left side of her nose and left upper lip.  She thinks this has been going on for a couple of years. She thinks that her symptoms have been stable over the last several months.  She is concerned that may  be due to COVID from a couple of years ago.  She has also had more congestion is not sure if this is contributing.  No reported vision changes.  No other reported weakness or numbness.  No reported headache.       Objective:  Physical Exam: BP 122/78   Pulse 68   Temp (!) 97.2 F (36.2 C) (Temporal)   Ht '5\' 4"'$  (1.626 m)   Wt 213 lb (96.6 kg)   LMP  (LMP Unknown)   SpO2 96%   BMI 36.56 kg/m   Wt Readings from Last 3 Encounters:  07/11/21 213 lb (96.6 kg)  04/25/21 218 lb 9.6 oz (99.2 kg)  04/12/21 224 lb 3.2 oz (101.7 kg)  Gen: No acute distress, resting comfortably HEENT: TMs with clear effusion.  Oral mucosa clear.  Nasal mucosa clear. CV: Regular rate and rhythm with no murmurs appreciated Pulm: Normal work of breathing, clear to auscultation bilaterally with no crackles, wheezes, or rhonchi Neuro: Cranial nerves II through XII intact.  Strength 5 out of 5 in upper and lower extremities. Psych: Normal affect and thought content  Time Spent: 45 minutes of total time was spent on the date of the encounter performing the following actions: chart review prior to seeing the patient including recent ED visit, obtaining history, performing a medically necessary exam, counseling on the treatment plan, placing orders, and documenting in our EHR.  Algis Greenhouse. [name], MD 07/11/2021 2:49 PM

## 2021-07-11 NOTE — Assessment & Plan Note (Signed)
She has lost about 11 pounds over the last 3 months.  Advised her to increase her Ozempic to 0.5 mg weekly.  We will check A1c today.  She is working on diet and exercise.

## 2021-07-11 NOTE — Assessment & Plan Note (Signed)
Continue losartan-HCTZ 100-25 once daily and amlodipine 5 mg daily.

## 2021-07-11 NOTE — Assessment & Plan Note (Signed)
Could be contributing to her sinus infection.  Continue Astelin.  If this continues to be an issue would consider referral to ENT or allergist.

## 2021-07-12 LAB — COMPREHENSIVE METABOLIC PANEL
ALT: 40 U/L — ABNORMAL HIGH (ref 0–35)
AST: 30 U/L (ref 0–37)
Albumin: 4.1 g/dL (ref 3.5–5.2)
Alkaline Phosphatase: 55 U/L (ref 39–117)
BUN: 13 mg/dL (ref 6–23)
CO2: 33 mEq/L — ABNORMAL HIGH (ref 19–32)
Calcium: 9.5 mg/dL (ref 8.4–10.5)
Chloride: 97 mEq/L (ref 96–112)
Creatinine, Ser: 0.82 mg/dL (ref 0.40–1.20)
GFR: 76.82 mL/min (ref >60.00)
Glucose, Bld: 74 mg/dL (ref 70–99)
Potassium: 3.2 mEq/L — ABNORMAL LOW (ref 3.5–5.1)
Sodium: 137 mEq/L (ref 135–145)
Total Bilirubin: 0.4 mg/dL (ref 0.2–1.2)
Total Protein: 7.7 g/dL (ref 6.0–8.3)

## 2021-07-12 LAB — HEMOGLOBIN A1C: Hgb A1c MFr Bld: 5.2 % (ref 4.6–6.5)

## 2021-07-12 LAB — CBC
HCT: 38.2 % (ref 36.0–46.0)
Hemoglobin: 12.4 g/dL (ref 12.0–15.0)
MCHC: 32.6 g/dL (ref 30.0–36.0)
MCV: 87.2 fl (ref 78.0–100.0)
Platelets: 246 10*3/uL (ref 150.0–400.0)
RBC: 4.38 Mil/uL (ref 3.87–5.11)
RDW: 14.4 % (ref 11.5–15.5)
WBC: 5.4 10*3/uL (ref 4.0–10.5)

## 2021-07-13 NOTE — Progress Notes (Signed)
Please inform patient of the following:  Her potassium is still low but improving.  She should continue her potassium supplementation and we can recheck at her next office visit.  Her A1c is much improved to 5.2.  One of her liver numbers is very slightly elevated.  This is not significant.  We can also recheck this at her next office visit.  Everything else was normal.

## 2021-07-18 ENCOUNTER — Ambulatory Visit: Payer: BC Managed Care – PPO | Admitting: Family Medicine

## 2021-07-19 ENCOUNTER — Telehealth: Payer: Self-pay | Admitting: Family Medicine

## 2021-07-19 NOTE — Telephone Encounter (Signed)
Patient returned Stella's call. Patient requests to be called asap at ph# [phone]. Patient states she is concerned about the reason for the call.

## 2021-07-20 NOTE — Telephone Encounter (Signed)
See results note. 

## 2021-08-14 ENCOUNTER — Encounter: Payer: Self-pay | Admitting: Family Medicine

## 2021-08-19 ENCOUNTER — Telehealth: Payer: Self-pay | Admitting: Family Medicine

## 2021-08-19 NOTE — Telephone Encounter (Signed)
Patient states: - She was informed by [facility] that her ozempic needed an approval from her PCP in order to be filled.  - She sent a Mychart message on 08/13 but had not received a reply from PCP team.  - She needs this medication and will have to go a week without it if not filled by 08/18.   Patient requests: - Dr. [name] speak with [facility] to approve medication ASAP

## 2021-08-20 ENCOUNTER — Other Ambulatory Visit: Payer: Self-pay | Admitting: *Deleted

## 2021-08-20 MED ORDER — OZEMPIC (0.25 OR 0.5 MG/DOSE) 2 MG/3ML ~~LOC~~ SOPN: PEN_INJECTOR | SUBCUTANEOUS | 0 refills | Status: DC

## 2021-08-20 NOTE — Telephone Encounter (Signed)
Rx send to pharmacy  

## 2021-08-22 NOTE — Telephone Encounter (Signed)
Rx send to pharmacy  

## 2021-09-16 ENCOUNTER — Other Ambulatory Visit: Payer: Self-pay | Admitting: Family Medicine

## 2021-09-16 ENCOUNTER — Encounter: Payer: Self-pay | Admitting: Family Medicine

## 2021-09-16 DIAGNOSIS — I10 Essential (primary) hypertension: Secondary | ICD-10-CM

## 2021-09-20 ENCOUNTER — Other Ambulatory Visit: Payer: Self-pay

## 2021-09-20 DIAGNOSIS — I10 Essential (primary) hypertension: Secondary | ICD-10-CM

## 2021-09-20 MED ORDER — LOSARTAN POTASSIUM-HCTZ 100-25 MG PO TABS: 1.0000 | ORAL_TABLET | Freq: Every day | ORAL | 0 refills | Status: DC

## 2021-10-18 ENCOUNTER — Other Ambulatory Visit: Payer: Self-pay | Admitting: Family Medicine

## 2021-10-21 ENCOUNTER — Telehealth: Payer: Self-pay | Admitting: Family Medicine

## 2021-10-21 NOTE — Telephone Encounter (Signed)
Patient requests to be called at ph# [phone]  regarding: Patient states she is unable to get RX for Ozempic due to Pharmacy told Patient they are trying but have been unable to get Ozempic in stock.   Patient requests to be advised.

## 2021-10-24 NOTE — Telephone Encounter (Signed)
Can an alternative medication be sent in? Please advise

## 2021-10-25 NOTE — Telephone Encounter (Signed)
CAn we clarify what dose of ozempic she is on? We can switch to trulicity but need to make sure she is getting the right dose.  Algis Greenhouse. [name], MD 10/25/2021 8:13 AM

## 2021-10-26 NOTE — Telephone Encounter (Signed)
Spoke ;with patient, patient taking Ozempic 0.5 weekly  Received medication, no need changes at this time  Has medication question, schedule OV with PCP

## 2021-11-01 ENCOUNTER — Ambulatory Visit: Payer: BC Managed Care – PPO | Admitting: Family Medicine

## 2021-11-01 ENCOUNTER — Encounter: Payer: Self-pay | Admitting: Family Medicine

## 2021-11-01 VITALS — BP 105/75 | HR 70 | Temp 97.3°F | Ht 64.0 in | Wt 214.4 lb

## 2021-11-01 DIAGNOSIS — E1169 Type 2 diabetes mellitus with other specified complication: Secondary | ICD-10-CM

## 2021-11-01 DIAGNOSIS — I152 Hypertension secondary to endocrine disorders: Secondary | ICD-10-CM

## 2021-11-01 DIAGNOSIS — Q178 Other specified congenital malformations of ear: Secondary | ICD-10-CM

## 2021-11-01 DIAGNOSIS — J301 Allergic rhinitis due to pollen: Secondary | ICD-10-CM | POA: Diagnosis not present

## 2021-11-01 DIAGNOSIS — E669 Obesity, unspecified: Secondary | ICD-10-CM | POA: Diagnosis not present

## 2021-11-01 DIAGNOSIS — E1159 Type 2 diabetes mellitus with other circulatory complications: Secondary | ICD-10-CM | POA: Diagnosis not present

## 2021-11-01 LAB — POCT GLYCOSYLATED HEMOGLOBIN (HGB A1C): Hemoglobin A1C: 5.1 % (ref 4.0–5.6)

## 2021-11-01 MED ORDER — SEMAGLUTIDE (1 MG/DOSE) 4 MG/3ML ~~LOC~~ SOPN: 1.0000 mg | PEN_INJECTOR | SUBCUTANEOUS | 3 refills | Status: DC

## 2021-11-01 MED ORDER — AZELASTINE HCL 0.1 % NA SOLN: 2.0000 | Freq: Two times a day (BID) | NASAL | 12 refills | Status: DC

## 2021-11-01 NOTE — Assessment & Plan Note (Signed)
At goal on losartan- HCTZ 100-25 once daily and amlodipine '5mg'$  daily.

## 2021-11-01 NOTE — Patient Instructions (Signed)
It was very nice to see you today!  P please increaselease increase your Ozempic to 1 mg weekly limit how this is working for you in a few weeks.  I will refer you to see a plastic surgeon for your earlobe.  Please back to see me in 3 months.  Come back sooner if needed.  Take care, Dr [name]  PLEASE NOTE:  If you had any lab tests please let us know if you have not heard back within a few days. You may see your results on mychart before we have a chance to review them but we will give you a call once they are reviewed by [place]. If we ordered any referrals today, please let us know if you have not heard from their office within the next week.   Please try these tips to maintain a healthy lifestyle:  Eat at least 3 REAL meals and 1-2 snacks per day.  Aim for no more than 5 hours between eating.  If you eat breakfast, please do so within one hour of getting up.   Each meal should contain half fruits/vegetables, one quarter protein, and one quarter carbs (no bigger than a computer mouse)  Cut down on sweet beverages. This includes juice, soda, and sweet tea.   Drink at least 1 glass of water with each meal and aim for at least 8 glasses per day  Exercise at least 150 minutes every week.

## 2021-11-01 NOTE — Assessment & Plan Note (Signed)
Continue Astelin.  Will refill today.

## 2021-11-01 NOTE — Progress Notes (Signed)
   Diane Alexander is a 61 y.o. female who presents today for an office visit.  Assessment/Plan:  New/Acute Problems: Split earlobe We will refer to plastic surgery for further evaluation and management.  Chronic Problems Addressed Today: Obesity (BMI 30-39.9) I lengthy discussion with patient today regarding her weight.  Her weight stable today. She has been working on diet and exercise but has not had much weight loss.  We did discuss lifestyle modifications.  We will increase ozempic to '1mg'$  weekly. Follow up in 3 months.   Hypertension associated with diabetes ([place]) At goal on losartan- HCTZ 100-25 once daily and amlodipine '5mg'$  daily.   Seasonal allergic rhinitis due to pollen Continue Astelin.  Will refill today.  Type 2 diabetes mellitus with other specified complication (H[facility]) P0L 5.1.  Tolerating Ozempic well.  Will be increasing dose to 1 mg weekly.  Follow-up in 3 months.     Subjective:  HPI:  See A/p for status of chronic conditions.   She has also noticed that her left earlobe seems to be splitting.  She does not wear heavy earrings.  She would like to have this corrected.       Objective:  Physical Exam: BP 105/75   Pulse 70   Temp (!) 97.3 F (36.3 C) (Temporal)   Ht '5\' 4"'$  (1.626 m)   Wt 214 lb 6.4 oz (97.3 kg)   LMP  (LMP Unknown)   SpO2 100%   BMI 36.80 kg/m   Wt Readings from Last 3 Encounters:  11/01/21 214 lb 6.4 oz (97.3 kg)  07/11/21 213 lb (96.6 kg)  04/25/21 218 lb 9.6 oz (99.2 kg)  Gen: No acute distress, resting comfortably HEENT: Left earlobe with partial split. CV: Regular rate and rhythm with no murmurs appreciated Pulm: Normal work of breathing, clear to auscultation bilaterally with no crackles, wheezes, or rhonchi Neuro: Grossly normal, moves all extremities Psych: Normal affect and thought content  Time Spent: 40 minutes of total time was spent on the date of the encounter performing the following actions: chart review prior to  seeing the patient, obtaining history, performing a medically necessary exam, counseling on the treatment plan, discussing medication changes and lifestyle modifications placing orders, and documenting in our EHR.        Algis Greenhouse. [name], MD 11/01/2021 9:01 AM

## 2021-11-01 NOTE — Assessment & Plan Note (Signed)
A1c 5.1.  Tolerating Ozempic well.  Will be increasing dose to 1 mg weekly.  Follow-up in 3 months.

## 2021-11-01 NOTE — Assessment & Plan Note (Addendum)
I lengthy discussion with patient today regarding her weight.  Her weight stable today. She has been working on diet and exercise but has not had much weight loss.  We did discuss lifestyle modifications.  We will increase ozempic to '1mg'$  weekly. Follow up in 3 months.

## 2021-11-04 ENCOUNTER — Encounter: Payer: Self-pay | Admitting: Family Medicine

## 2021-11-04 ENCOUNTER — Other Ambulatory Visit: Payer: Self-pay | Admitting: Family Medicine

## 2021-11-04 ENCOUNTER — Telehealth: Payer: Self-pay | Admitting: Family Medicine

## 2021-11-04 DIAGNOSIS — I10 Essential (primary) hypertension: Secondary | ICD-10-CM

## 2021-11-04 NOTE — Telephone Encounter (Signed)
Patient states she does not understand why she has to go through calling PCP office of r RX refill medication that she has to take every day because RX requires Dr. Christie Beckers.  Requests escalation to Administration.  LAST APPOINTMENT DATE:  Please schedule appointment if longer than 1 year  11/01/21  NEXT APPOINTMENT DATE:    MEDICATION: amLODipine (NORVASC) 5 MG tablet   Is the patient out of medication? Yes  PHARMACY:  [facility] # [place], [place] Phone: [phone]  Fax: [phone]      Let patient know to contact pharmacy at the end of the day to make sure medication is ready.  Please notify patient to allow 48-72 hours to process

## 2021-11-07 NOTE — Telephone Encounter (Signed)
Rx refill was send in o 11/04/2021

## 2021-12-23 ENCOUNTER — Ambulatory Visit: Payer: BC Managed Care – PPO | Admitting: Family Medicine

## 2021-12-23 VITALS — BP 102/71 | HR 75 | Temp 97.5°F | Wt 209.8 lb

## 2021-12-23 DIAGNOSIS — I152 Hypertension secondary to endocrine disorders: Secondary | ICD-10-CM

## 2021-12-23 DIAGNOSIS — E1159 Type 2 diabetes mellitus with other circulatory complications: Secondary | ICD-10-CM | POA: Diagnosis not present

## 2021-12-23 DIAGNOSIS — E669 Obesity, unspecified: Secondary | ICD-10-CM | POA: Diagnosis not present

## 2021-12-23 DIAGNOSIS — M2550 Pain in unspecified joint: Secondary | ICD-10-CM

## 2021-12-23 DIAGNOSIS — E1169 Type 2 diabetes mellitus with other specified complication: Secondary | ICD-10-CM

## 2021-12-23 NOTE — Progress Notes (Signed)
   Diane Alexander is a 61 y.o. female who presents today for an office visit.  Assessment/Plan:  New/Acute Problems: Left Hand / thumb Pain Likely secondary to osteoarthritis.  She has been following with orthopedics for arthritis in her hips.  She has some Celebrex however she has not been taking it.  Instructed her to take Celebrex 200 mg twice daily for the next 1 to 2 weeks.  She can also use ice to the area.  Advised her to follow-up with orthopedics if not improving.  Chronic Problems Addressed Today: Hypertension associated with diabetes ([place]) Blood pressure at goal on losartan-HCTZ 100-25 once daily and amlodipine 5 mg daily.  Obesity (BMI 30-39.9) She is on Ozempic 1 mg weekly.  Tolerating well.  Down about 5 pounds over the last couple of months.  Congratulated patient on weight loss.  Type 2 diabetes mellitus with other specified complication (HCC) Last Z5G 5.1.  She is doing well on Ozempic 1 mg weekly.  We can recheck A1c at next office visit.  Arthralgia of multiple joints, with negative Rheum panel Patient with underlying osteoarthritis.  She has had a rheum panel done with her previous PCP in the past which was negative though still has continued issues with joint pains.  Advised her to follow-up with orthopedics.  If continues to have polyarthralgia would consider rheumatology referral for further evaluation.     Subjective:  HPI:  See A/P for status of chronic conditions.  Patient is here with left hand and thumb weakness and pain.  Tried using heat and ice without much improvement. Symptoms have been going on for awhile but worsened after getting a massage to the area about a month ago. She has an orthopedist that has been managing her hip arthritis. She has stopped taking OTC analgesics due to concern for bruising.  No obvious injuries or other precipitating events.        Objective:  Physical Exam: BP 102/71   Pulse 75   Temp (!) 97.5 F (36.4 C) (Temporal)   Wt  209 lb 12.8 oz (95.2 kg)   LMP  (LMP Unknown)   SpO2 99%   BMI 36.01 kg/m   Wt Readings from Last 3 Encounters:  12/23/21 209 lb 12.8 oz (95.2 kg)  11/01/21 214 lb 6.4 oz (97.3 kg)  07/11/21 213 lb (96.6 kg)    Gen: No acute distress, resting comfortably MSK: - Hand: Bony hypertrophy noted in most joints and by lateral hand.  Left thumb with tenderness palpation along MCP and CMP joint.  Mild pain with axial loading.  Finkelstein test positive. Neuro: Grossly normal, moves all extremities Psych: Normal affect and thought content      Keston Seever M. [name], MD 12/23/2021 9:03 AM

## 2021-12-23 NOTE — Assessment & Plan Note (Signed)
Last A1c 5.1.  She is doing well on Ozempic 1 mg weekly.  We can recheck A1c at next office visit.

## 2021-12-23 NOTE — Assessment & Plan Note (Signed)
Blood pressure at goal on losartan-HCTZ 100-25 once daily and amlodipine 5 mg daily.

## 2021-12-23 NOTE — Patient Instructions (Signed)
It was very nice to see you today!  Please start the celebrex. Take twice daily for 1-2 weeks.  Let me know if not improving.   Take care, Dr [name]  PLEASE NOTE:  If you had any lab tests, please let us know if you have not heard back within a few days. You may see your results on mychart before we have a chance to review them but we will give you a call once they are reviewed by [place].   If we ordered any referrals today, please let us know if you have not heard from their office within the next week.   If you had any urgent prescriptions sent in today, please check with the pharmacy within an hour of our visit to make sure the prescription was transmitted appropriately.   Please try these tips to maintain a healthy lifestyle:  Eat at least 3 REAL meals and 1-2 snacks per day.  Aim for no more than 5 hours between eating.  If you eat breakfast, please do so within one hour of getting up.   Each meal should contain half fruits/vegetables, one quarter protein, and one quarter carbs (no bigger than a computer mouse)  Cut down on sweet beverages. This includes juice, soda, and sweet tea.   Drink at least 1 glass of water with each meal and aim for at least 8 glasses per day  Exercise at least 150 minutes every week.

## 2021-12-23 NOTE — Assessment & Plan Note (Signed)
She is on Ozempic 1 mg weekly.  Tolerating well.  Down about 5 pounds over the last couple of months.  Congratulated patient on weight loss.

## 2021-12-23 NOTE — Assessment & Plan Note (Signed)
Patient with underlying osteoarthritis.  She has had a rheum panel done with her previous PCP in the past which was negative though still has continued issues with joint pains.  Advised her to follow-up with orthopedics.  If continues to have polyarthralgia would consider rheumatology referral for further evaluation.

## 2022-01-05 ENCOUNTER — Encounter: Payer: Self-pay | Admitting: Family Medicine

## 2022-01-06 ENCOUNTER — Other Ambulatory Visit: Payer: Self-pay | Admitting: *Deleted

## 2022-01-06 DIAGNOSIS — I10 Essential (primary) hypertension: Secondary | ICD-10-CM

## 2022-01-06 DIAGNOSIS — M1612 Unilateral primary osteoarthritis, left hip: Secondary | ICD-10-CM | POA: Diagnosis not present

## 2022-01-06 MED ORDER — LOSARTAN POTASSIUM-HCTZ 100-25 MG PO TABS: 1.0000 | ORAL_TABLET | Freq: Every day | ORAL | 1 refills | Status: DC

## 2022-01-06 NOTE — Telephone Encounter (Signed)
  LAST APPOINTMENT DATE: 12/23/21  NEXT APPOINTMENT DATE:  MEDICATION:  Losartan Potassium Hctz 100-25 Mg prescription (Per Patient)    Is the patient out of medication? Yes  PHARMACY:  [facility] # [place], [place] Phone: [phone]  Fax: [phone]    Let patient know to contact pharmacy at the end of the day to make sure medication is ready.  Please notify patient to allow 48-72 hours to process

## 2022-01-10 DIAGNOSIS — M25552 Pain in left hip: Secondary | ICD-10-CM | POA: Diagnosis not present

## 2022-01-19 DIAGNOSIS — M79645 Pain in left finger(s): Secondary | ICD-10-CM | POA: Insufficient documentation

## 2022-02-09 ENCOUNTER — Ambulatory Visit (INDEPENDENT_AMBULATORY_CARE_PROVIDER_SITE_OTHER): Payer: BC Managed Care – PPO | Admitting: Family Medicine

## 2022-02-09 ENCOUNTER — Encounter: Payer: Self-pay | Admitting: Family Medicine

## 2022-02-09 VITALS — BP 100/69 | HR 79 | Temp 98.0°F | Ht 64.0 in | Wt 209.0 lb

## 2022-02-09 DIAGNOSIS — Z13220 Encounter for screening for lipoid disorders: Secondary | ICD-10-CM | POA: Diagnosis not present

## 2022-02-09 DIAGNOSIS — E890 Postprocedural hypothyroidism: Secondary | ICD-10-CM | POA: Diagnosis not present

## 2022-02-09 DIAGNOSIS — I152 Hypertension secondary to endocrine disorders: Secondary | ICD-10-CM

## 2022-02-09 DIAGNOSIS — E1169 Type 2 diabetes mellitus with other specified complication: Secondary | ICD-10-CM | POA: Diagnosis not present

## 2022-02-09 DIAGNOSIS — E559 Vitamin D deficiency, unspecified: Secondary | ICD-10-CM | POA: Diagnosis not present

## 2022-02-09 DIAGNOSIS — R5383 Other fatigue: Secondary | ICD-10-CM

## 2022-02-09 DIAGNOSIS — E1159 Type 2 diabetes mellitus with other circulatory complications: Secondary | ICD-10-CM

## 2022-02-09 LAB — COMPREHENSIVE METABOLIC PANEL
ALT: 42 U/L — ABNORMAL HIGH (ref 0–35)
AST: 21 U/L (ref 0–37)
Albumin: 3.8 g/dL (ref 3.5–5.2)
Alkaline Phosphatase: 50 U/L (ref 39–117)
BUN: 14 mg/dL (ref 6–23)
CO2: 31 mEq/L (ref 19–32)
Calcium: 9.2 mg/dL (ref 8.4–10.5)
Chloride: 101 mEq/L (ref 96–112)
Creatinine, Ser: 0.72 mg/dL (ref 0.40–1.20)
GFR: 89.43 mL/min (ref >60.00)
Glucose, Bld: 103 mg/dL — ABNORMAL HIGH (ref 70–99)
Potassium: 3.3 mEq/L — ABNORMAL LOW (ref 3.5–5.1)
Sodium: 140 mEq/L (ref 135–145)
Total Bilirubin: 0.4 mg/dL (ref 0.2–1.2)
Total Protein: 6.8 g/dL (ref 6.0–8.3)

## 2022-02-09 LAB — CBC
HCT: 38.5 % (ref 36.0–46.0)
Hemoglobin: 12.9 g/dL (ref 12.0–15.0)
MCHC: 33.5 g/dL (ref 30.0–36.0)
MCV: 86 fl (ref 78.0–100.0)
Platelets: 225 10*3/uL (ref 150.0–400.0)
RBC: 4.47 Mil/uL (ref 3.87–5.11)
RDW: 14.1 % (ref 11.5–15.5)
WBC: 5.1 10*3/uL (ref 4.0–10.5)

## 2022-02-09 LAB — MICROALBUMIN / CREATININE URINE RATIO
Creatinine,U: 120.4 mg/dL
Microalb Creat Ratio: 0.6 mg/g (ref 0.0–30.0)
Microalb, Ur: <0.7 mg/dL (ref 0.0–1.9)

## 2022-02-09 LAB — LIPID PANEL
Cholesterol: 190 mg/dL (ref 0–200)
HDL: 63.4 mg/dL (ref >39.00)
LDL Cholesterol: 112 mg/dL — ABNORMAL HIGH (ref 0–99)
NonHDL: 126.89
Total CHOL/HDL Ratio: 3
Triglycerides: 74 mg/dL (ref 0.0–149.0)
VLDL: 14.8 mg/dL (ref 0.0–40.0)

## 2022-02-09 LAB — VITAMIN D 25 HYDROXY (VIT D DEFICIENCY, FRACTURES): VITD: 31.17 ng/mL (ref 30.00–100.00)

## 2022-02-09 LAB — VITAMIN B12: Vitamin B-12: 532 pg/mL (ref 211–911)

## 2022-02-09 LAB — HEMOGLOBIN A1C: Hgb A1c MFr Bld: 5.2 % (ref 4.6–6.5)

## 2022-02-09 LAB — T4, FREE: Free T4: 1.32 ng/dL (ref 0.60–1.60)

## 2022-02-09 LAB — TSH: TSH: 8.45 u[IU]/mL — ABNORMAL HIGH (ref 0.35–5.50)

## 2022-02-09 LAB — T3, FREE: T3, Free: 2.6 pg/mL (ref 2.3–4.2)

## 2022-02-09 NOTE — Assessment & Plan Note (Signed)
Blood pressure 100/69 on losartan-HCTZ 100-25 once daily and amlodipine 5 mg daily.  She has been around this range for the last several months.  Doubt that this is causing much of her fatigue however would be reasonable to stop all of her antihypertensives at this point given her good control.  Discussed stopping her HCTZ however she deferred.  Will stop amlodipine.  She will monitor her blood pressure at home and check with [place] in a few weeks via MyChart.

## 2022-02-09 NOTE — Assessment & Plan Note (Signed)
Follows with endocrinology.  Will check TSH T4, and T3 today.  She is on Synthroid 112 mcg daily.  This could be contributing to her fatigue.

## 2022-02-09 NOTE — Assessment & Plan Note (Signed)
Last A1c at goal.  Check A1c.  On Ozempic 1 mg weekly.

## 2022-02-09 NOTE — Assessment & Plan Note (Signed)
Check vitamin D. 

## 2022-02-09 NOTE — Patient Instructions (Addendum)
It was very nice to see you today!  We will check labs today.  Please stop the amlodipine.  Keep an eye on your blood pressure and let me know how this is looking over the next couple of weeks.  Take care, Dr [name]  PLEASE NOTE:  If you had any lab tests, please let us know if you have not heard back within a few days. You may see your results on mychart before we have a chance to review them but we will give you a call once they are reviewed by [place].   If we ordered any referrals today, please let us know if you have not heard from their office within the next week.   If you had any urgent prescriptions sent in today, please check with the pharmacy within an hour of our visit to make sure the prescription was transmitted appropriately.   Please try these tips to maintain a healthy lifestyle:  Eat at least 3 REAL meals and 1-2 snacks per day.  Aim for no more than 5 hours between eating.  If you eat breakfast, please do so within one hour of getting up.   Each meal should contain half fruits/vegetables, one quarter protein, and one quarter carbs (no bigger than a computer mouse)  Cut down on sweet beverages. This includes juice, soda, and sweet tea.   Drink at least 1 glass of water with each meal and aim for at least 8 glasses per day  Exercise at least 150 minutes every week.

## 2022-02-09 NOTE — Progress Notes (Signed)
   Diane Alexander is a 62 y.o. female who presents today for an office visit.  Assessment/Plan:  New/Acute Problems: Other Fatigue  Broad differential. Will check labs today including CBC, CMET, TSH, B12, vitamin D.  She is probably not getting adequate sleep either.  We discussed stopping one of her diuretics however deferred for now.  If above workup is negative and continues to have issues with fatigue would consider referral for sleep study.  Chronic Problems Addressed Today: Hypertension associated with diabetes ([place]) Blood pressure 100/69 on losartan-HCTZ 100-25 once daily and amlodipine 5 mg daily.  She has been around this range for the last several months.  Doubt that this is causing much of her fatigue however would be reasonable to stop all of her antihypertensives at this point given her good control.  Discussed stopping her HCTZ however she deferred.  Will stop amlodipine.  She will monitor her blood pressure at home and check with [place] in a few weeks via MyChart.  Hypothyroidism following RAI 12/2016, on Levothyroxine Follows with endocrinology.  Will check TSH T4, and T3 today.  She is on Synthroid 112 mcg daily.  This could be contributing to her fatigue.  Type 2 diabetes mellitus with other specified complication (HCC) Last X9K at goal.  Check A1c.  On Ozempic 1 mg weekly.  Vitamin D deficiency, taking 2000 IU daily Check vitamin D.     Subjective:  HPI:  See A/p for status of chronic conditions.  Her main concern today is fatigue. Started a few days ago. Feels very tired during the day. She is working on intermittent fasting. This seems to be going well. No recent illnesses. She has been trying to take vitamin D and B12. She does wake up quite a bit at night to urinate.  Has tried melatonin without much improvement.       Objective:  Physical Exam: BP 100/69   Pulse 79   Temp 98 F (36.7 C) (Temporal)   Ht '5\' 4"'$  (1.626 m)   Wt 209 lb (94.8 kg)   LMP  (LMP  Unknown)   SpO2 100%   BMI 35.87 kg/m   Wt Readings from Last 3 Encounters:  02/09/22 209 lb (94.8 kg)  12/23/21 209 lb 12.8 oz (95.2 kg)  11/01/21 214 lb 6.4 oz (97.3 kg)  Gen: No acute distress, resting comfortably CV: Regular rate and rhythm with no murmurs appreciated Pulm: Normal work of breathing, clear to auscultation bilaterally with no crackles, wheezes, or rhonchi Neuro: Grossly normal, moves all extremities Psych: Normal affect and thought content      Irean Kendricks M. [name], MD 02/09/2022 8:23 AM

## 2022-02-10 ENCOUNTER — Other Ambulatory Visit: Payer: Self-pay | Admitting: *Deleted

## 2022-02-10 ENCOUNTER — Encounter: Payer: Self-pay | Admitting: Family Medicine

## 2022-02-10 ENCOUNTER — Telehealth: Payer: Self-pay | Admitting: Family Medicine

## 2022-02-10 DIAGNOSIS — R748 Abnormal levels of other serum enzymes: Secondary | ICD-10-CM

## 2022-02-10 MED ORDER — LEVOTHYROXINE SODIUM 125 MCG PO TABS: 125.0000 ug | ORAL_TABLET | Freq: Every day | ORAL | 1 refills | Status: DC

## 2022-02-10 NOTE — Telephone Encounter (Signed)
See results note. 

## 2022-02-10 NOTE — Progress Notes (Signed)
Please inform patient of the following:  Her thyroid level is off.  This is probably what is causing her fatigue.  Please verify that she has been taking her dose of thyroid as prescribed.  If so we can increase to 125 mcg daily.  Please send a new prescription if needed.  I would like for her to come back in 6 weeks to recheck her thyroid if she would like for [place] to take over management for this otherwise she should get back into see endocrinology for further adjustments.  Her potassium numbers are stable.  Her cholesterol numbers are up a little bit but stable.  We can recheck this again in about a year or so.  Her blood sugar and A1c levels are well-controlled.  She can continue her current dose of Ozempic.  We should recheck this again in 3 to 6 months.  One of her liver numbers was up a little bit.  Please have her come back soon to recheck CMET.  We can recheck everything else in year.

## 2022-02-10 NOTE — Telephone Encounter (Signed)
Patient requests to be called at ph# [phone] to discuss lab results and how she is going to prescriptions filled based on lab results-see MyChart message Patient sent

## 2022-02-10 NOTE — Telephone Encounter (Signed)
Left message to return call to our office at their convenience.  

## 2022-02-17 ENCOUNTER — Telehealth: Payer: Self-pay

## 2022-02-17 ENCOUNTER — Other Ambulatory Visit (INDEPENDENT_AMBULATORY_CARE_PROVIDER_SITE_OTHER): Payer: BC Managed Care – PPO

## 2022-02-17 DIAGNOSIS — R748 Abnormal levels of other serum enzymes: Secondary | ICD-10-CM

## 2022-02-17 LAB — COMPREHENSIVE METABOLIC PANEL
ALT: 33 U/L (ref 0–35)
AST: 21 U/L (ref 0–37)
Albumin: 4 g/dL (ref 3.5–5.2)
Alkaline Phosphatase: 56 U/L (ref 39–117)
BUN: 16 mg/dL (ref 6–23)
CO2: 33 mEq/L — ABNORMAL HIGH (ref 19–32)
Calcium: 9.5 mg/dL (ref 8.4–10.5)
Chloride: 98 mEq/L (ref 96–112)
Creatinine, Ser: 0.88 mg/dL (ref 0.40–1.20)
GFR: 70.28 mL/min (ref >60.00)
Glucose, Bld: 90 mg/dL (ref 70–99)
Potassium: 3.1 mEq/L — ABNORMAL LOW (ref 3.5–5.1)
Sodium: 138 mEq/L (ref 135–145)
Total Bilirubin: 0.8 mg/dL (ref 0.2–1.2)
Total Protein: 7.2 g/dL (ref 6.0–8.3)

## 2022-02-17 NOTE — Telephone Encounter (Signed)
It is fine for her to take an iron supplement.  Algis Greenhouse. [name], MD 02/17/2022 9:55 AM

## 2022-02-17 NOTE — Telephone Encounter (Signed)
LMOVM with PCP comments

## 2022-02-17 NOTE — Telephone Encounter (Signed)
Patient is wanting to know if its okay to iron supplements. Worried about interactions with her liver enzymes and thyroid. Please advise

## 2022-02-20 NOTE — Progress Notes (Signed)
Please inform patient of the following:  Her liver numbers are back to baseline.

## 2022-02-24 ENCOUNTER — Encounter: Payer: Self-pay | Admitting: Plastic Surgery

## 2022-02-24 ENCOUNTER — Ambulatory Visit (INDEPENDENT_AMBULATORY_CARE_PROVIDER_SITE_OTHER): Payer: Self-pay | Admitting: Plastic Surgery

## 2022-02-24 VITALS — BP 112/76 | HR 77 | Ht 64.5 in | Wt 210.8 lb

## 2022-02-24 DIAGNOSIS — Z719 Counseling, unspecified: Secondary | ICD-10-CM

## 2022-02-24 DIAGNOSIS — S01312A Laceration without foreign body of left ear, initial encounter: Secondary | ICD-10-CM | POA: Insufficient documentation

## 2022-02-24 NOTE — Progress Notes (Signed)
Patient ID: Diane Alexander, female    DOB: 07-30-1960, 62 y.o.   MRN: [id number]   Chief Complaint  Patient presents with   Consult    The patient is a 62 year old female here for evaluation of her earlobes.  The patient states she is only ever wore studs and not very heavy ones.  But she has a tear of the left earlobe.  It is slightly anterior and very close to the edge.  We talked about ways to repair it.  There is no sign of infection and she is otherwise in good health.    Review of Systems  Constitutional: Negative.   Eyes: Negative.   Respiratory: Negative.  Negative for chest tightness and stridor.   Cardiovascular: Negative.   Gastrointestinal: Negative.   Endocrine: Negative.   Genitourinary: Negative.   Musculoskeletal: Negative.     Past Medical History:  Diagnosis Date   Allergy    Bilateral primary osteoarthritis of hip 05/22/2019   Constipation    Lumbar arthropathy     Past Surgical History:  Procedure Laterality Date   BREAST CYST EXCISION     CHOLECYSTECTOMY     VAGINAL HYSTERECTOMY        Current Outpatient Medications:    aspirin 81 MG chewable tablet, Chew 243 mg by mouth once as needed (for chest discomfort)., Disp: , Rfl:    azelastine (ASTELIN) 0.1 % nasal spray, Place 2 sprays into both nostrils 2 (two) times daily., Disp: 30 mL, Rfl: 12   calcium carbonate (TUMS - DOSED IN MG ELEMENTAL CALCIUM) 500 MG chewable tablet, Chew 1 tablet by mouth as needed., Disp: , Rfl:    carbamazepine (TEGRETOL-XR) 100 MG 12 hr tablet, Take 1 tablet (100 mg total) by mouth 2 (two) times daily., Disp: 60 tablet, Rfl: 5   celecoxib (CELEBREX) 200 MG capsule, , Disp: , Rfl:    Cholecalciferol (VITAMIN D3) 50 MCG (2000 UT) TABS, Take 2,000 Units by mouth daily., Disp: , Rfl:    diclofenac (VOLTAREN) 75 MG EC tablet, Take 1 tablet by mouth twice daily, Disp: 30 tablet, Rfl: 0   diclofenac sodium (VOLTAREN) 1 % GEL, Apply 2 g topically 4 (four) times daily., Disp: 100 g,  Rfl: 1   estradiol (ESTRACE) 2 MG tablet, Take one 2 mg tablet by mouth daily (Patient taking differently: Take 2 mg by mouth daily.), Disp: 90 tablet, Rfl: 0   furosemide (LASIX) 20 MG tablet, Take 1 tablet (20 mg total) by mouth daily as needed for edema., Disp: 30 tablet, Rfl: 2   levothyroxine (SYNTHROID) 125 MCG tablet, Take 1 tablet (125 mcg total) by mouth daily., Disp: 90 tablet, Rfl: 1   linaclotide (LINZESS) 72 MCG capsule, Take 1 capsule (72 mcg total) by mouth daily before breakfast., Disp: 90 capsule, Rfl: 0   LORazepam (ATIVAN) 0.5 MG tablet, 1-2 tabs 30 - 60 min prior to MRI. Do not drive with this medicine., Disp: 4 tablet, Rfl: 0   losartan-hydrochlorothiazide (HYZAAR) 100-25 MG tablet, Take 1 tablet by mouth daily., Disp: 90 tablet, Rfl: 1   polyethylene glycol powder (GLYCOLAX/MIRALAX) 17 GM/SCOOP powder, Take 17 g by mouth daily as needed for mild constipation., Disp: , Rfl:    potassium chloride SA (KLOR-CON M) 20 MEQ tablet, Take 1 tablet (20 mEq total) by mouth daily., Disp: 90 tablet, Rfl: 1   Semaglutide, 1 MG/DOSE, 4 MG/3ML SOPN, Inject 1 mg into the skin once a week., Disp: 3 mL, Rfl: 3  sodium chloride (OCEAN) 0.65 % SOLN nasal spray, Place 1 spray into both nostrils as needed for congestion., Disp: , Rfl:    vitamin B-12 (CYANOCOBALAMIN) 50 MCG tablet, Take 50 mcg by mouth daily., Disp: , Rfl:    ZYRTEC ALLERGY 10 MG tablet, Take 10 mg by mouth at bedtime., Disp: , Rfl:    Objective:   Vitals:   02/24/22 1048  BP: 112/76  Pulse: 77  SpO2: 97%    Physical Exam Vitals and nursing note reviewed.  Constitutional:      Appearance: Normal appearance.  HENT:     Head: Normocephalic.  Cardiovascular:     Rate and Rhythm: Normal rate.  Pulmonary:     Effort: Pulmonary effort is normal.  Skin:    General: Skin is warm.     Coloration: Skin is not jaundiced.     Findings: No bruising.  Neurological:     Mental Status: She is alert and oriented to person,  place, and time.  Psychiatric:        Mood and Affect: Mood normal.        Behavior: Behavior normal.     Assessment & Plan:  Torn ear lobe, left, initial encounter  I think because of the location a direct repair and placing the new hole more posterior would work best for her.  Patient is in agreement and we will get her scheduled.  Pictures were obtained of the patient and placed in the chart with the patient's or guardian's permission.   [place], DO

## 2022-03-01 ENCOUNTER — Other Ambulatory Visit: Payer: Self-pay | Admitting: *Deleted

## 2022-03-01 DIAGNOSIS — E890 Postprocedural hypothyroidism: Secondary | ICD-10-CM

## 2022-03-01 MED ORDER — SEMAGLUTIDE (1 MG/DOSE) 4 MG/3ML ~~LOC~~ SOPN: 1.0000 mg | PEN_INJECTOR | SUBCUTANEOUS | 3 refills | Status: DC

## 2022-03-03 NOTE — Telephone Encounter (Signed)
Please advise 

## 2022-03-03 NOTE — Telephone Encounter (Signed)
It is ok for her to come back to recheck her TSH if she wishes before we make any other dose adjustments.  Please place future order.

## 2022-03-06 ENCOUNTER — Other Ambulatory Visit: Payer: BC Managed Care – PPO

## 2022-03-07 DIAGNOSIS — R635 Abnormal weight gain: Secondary | ICD-10-CM | POA: Diagnosis not present

## 2022-03-07 DIAGNOSIS — E890 Postprocedural hypothyroidism: Secondary | ICD-10-CM | POA: Diagnosis not present

## 2022-03-10 DIAGNOSIS — E890 Postprocedural hypothyroidism: Secondary | ICD-10-CM | POA: Diagnosis not present

## 2022-03-24 ENCOUNTER — Other Ambulatory Visit: Payer: BC Managed Care – PPO

## 2022-03-31 DIAGNOSIS — M25571 Pain in right ankle and joints of right foot: Secondary | ICD-10-CM | POA: Insufficient documentation

## 2022-04-03 DIAGNOSIS — M25579 Pain in unspecified ankle and joints of unspecified foot: Secondary | ICD-10-CM | POA: Insufficient documentation

## 2022-04-03 DIAGNOSIS — M7661 Achilles tendinitis, right leg: Secondary | ICD-10-CM | POA: Diagnosis not present

## 2022-04-03 DIAGNOSIS — M25571 Pain in right ankle and joints of right foot: Secondary | ICD-10-CM | POA: Diagnosis not present

## 2022-04-17 DIAGNOSIS — M7661 Achilles tendinitis, right leg: Secondary | ICD-10-CM | POA: Insufficient documentation

## 2022-04-18 DIAGNOSIS — M7661 Achilles tendinitis, right leg: Secondary | ICD-10-CM | POA: Diagnosis not present

## 2022-04-25 ENCOUNTER — Telehealth: Payer: Self-pay | Admitting: *Deleted

## 2022-04-25 ENCOUNTER — Encounter: Payer: Self-pay | Admitting: Family Medicine

## 2022-04-25 NOTE — Telephone Encounter (Signed)
This request has been approved using information available on the patient's profile. [id number];Review Type:Prior Auth;Coverage Start Date:03/26/2022;Coverage End Date:04/25/2023;

## 2022-04-26 ENCOUNTER — Other Ambulatory Visit: Payer: Self-pay

## 2022-04-26 DIAGNOSIS — I10 Essential (primary) hypertension: Secondary | ICD-10-CM

## 2022-04-26 MED ORDER — LOSARTAN POTASSIUM-HCTZ 100-25 MG PO TABS: 1.0000 | ORAL_TABLET | Freq: Every day | ORAL | 1 refills | Status: DC

## 2022-05-02 DIAGNOSIS — M25571 Pain in right ankle and joints of right foot: Secondary | ICD-10-CM | POA: Diagnosis not present

## 2022-05-02 DIAGNOSIS — M7661 Achilles tendinitis, right leg: Secondary | ICD-10-CM | POA: Diagnosis not present

## 2022-06-02 ENCOUNTER — Telehealth: Payer: Self-pay | Admitting: *Deleted

## 2022-06-02 DIAGNOSIS — E890 Postprocedural hypothyroidism: Secondary | ICD-10-CM | POA: Diagnosis not present

## 2022-06-02 NOTE — Telephone Encounter (Signed)
Patient called at 2:58pm to confirm procedure on Tues 6/4 w/ Dr. Ulice Bold.  She wants to know if she should still take all of her medications on Monday and Tuesday morning as usual. Please call back at [phone].

## 2022-06-05 NOTE — Telephone Encounter (Signed)
Called and spoke with the patient and informed her of the message from Dr. Ulice Bold.  Please let her know yes as [name] as she is not on a blood thinner.    Patient verbalized understanding and agreed.  She stated that she does not take a blood thinner, but she was concerned about her thyroid medication.  Informed the patient she can take her thyroid medication.  Patient verbalized understanding and agreed.//AB/CMA

## 2022-06-06 ENCOUNTER — Encounter: Payer: Self-pay | Admitting: Plastic Surgery

## 2022-06-06 ENCOUNTER — Telehealth: Payer: Self-pay | Admitting: Plastic Surgery

## 2022-06-06 ENCOUNTER — Ambulatory Visit (INDEPENDENT_AMBULATORY_CARE_PROVIDER_SITE_OTHER): Payer: Self-pay | Admitting: Plastic Surgery

## 2022-06-06 VITALS — BP 122/84 | HR 82 | Ht 64.0 in | Wt 215.0 lb

## 2022-06-06 DIAGNOSIS — S01312A Laceration without foreign body of left ear, initial encounter: Secondary | ICD-10-CM

## 2022-06-06 DIAGNOSIS — Z411 Encounter for cosmetic surgery: Secondary | ICD-10-CM

## 2022-06-06 NOTE — Progress Notes (Signed)
Procedure Note Preoperative Dx: left split ear lobe  Postoperative Dx: left split ear lobe  Procedure: repair of left split ear lobe  Anesthesia: Lidocaine 1% with 1:100,000 epinepherine  Indication for Procedure: split ear lobe  Description of Procedure: Risks and complications were explained to the patient. Consent was confirmed and signed. Time out was called and all information was confirmed to be correct. The ear lobe was prepped with betadine and drapped. Lidocaine 1% with epinepherine was injected in the subcutaneous area. After waiting several minutes for the lidocaine to take affect an #11 blade was used to excise the previous epithelialized hole. The skin edges were reapproximated with 6-0 Monocryl interrupted sutures. Steri strips were applied. The patient is to follow up in one week. She tolerated the procedure well and there were no complications. Risks and complicatins were discussed and consent signed.

## 2022-06-06 NOTE — Telephone Encounter (Signed)
Had procedure this morning on ear and wanted to know if it should be bleeding?  Would like a call back from the nurse.

## 2022-06-07 ENCOUNTER — Ambulatory Visit (INDEPENDENT_AMBULATORY_CARE_PROVIDER_SITE_OTHER): Payer: Self-pay | Admitting: Physician Assistant

## 2022-06-07 VITALS — BP 122/82 | HR 81

## 2022-06-07 DIAGNOSIS — Z9889 Other specified postprocedural states: Secondary | ICD-10-CM

## 2022-06-07 DIAGNOSIS — S01312A Laceration without foreign body of left ear, initial encounter: Secondary | ICD-10-CM

## 2022-06-07 NOTE — Progress Notes (Signed)
This is a 62 year old female seen in our office for follow-up evaluation status post repair of left split earlobe by Dr. Ulice Bold yesterday 06/06/2022.  The patient notes that yesterday she had seen bleeding from the earlobe, she notes there were several occasions of this.  She notes no bleeding since midnight last night.  She denies any other issues or complications.  On exam Steri-Strip is in place over the left earlobe with some minimal dried blood, no active bleeding, no swelling.   Overall the patient is doing well.  There is no active bleeding, some minimal bleeding can be anticipated after this procedure.  I instructed the patient to apply pressure if she has any further bleeding, she will reach out to our office if she has any other issues or complications.  She will follow-up as previously scheduled.  She verbalized understanding and agreement to today's plan.

## 2022-06-12 ENCOUNTER — Ambulatory Visit (INDEPENDENT_AMBULATORY_CARE_PROVIDER_SITE_OTHER): Payer: Self-pay

## 2022-06-12 DIAGNOSIS — Z719 Counseling, unspecified: Secondary | ICD-10-CM

## 2022-06-12 NOTE — Progress Notes (Unsigned)
Patient is a pleasant 62 year old female with PMH of left split earlobe now s/p repair performed 06/06/2022 in office by Dr. Ulice Bold who returns to clinic for postprocedural follow-up.  She was seen the following day here in clinic for bleeding from the earlobe, but exam was benign and bleeding had stopped upon evaluation.  Return as scheduled.  Today,

## 2022-06-13 ENCOUNTER — Encounter: Payer: Self-pay | Admitting: Physician Assistant

## 2022-06-13 ENCOUNTER — Ambulatory Visit (INDEPENDENT_AMBULATORY_CARE_PROVIDER_SITE_OTHER): Payer: Self-pay | Admitting: Physician Assistant

## 2022-06-13 VITALS — BP 115/78 | HR 79 | Ht 64.0 in | Wt 209.6 lb

## 2022-06-13 DIAGNOSIS — Z9889 Other specified postprocedural states: Secondary | ICD-10-CM

## 2022-06-13 DIAGNOSIS — S01312D Laceration without foreign body of left ear, subsequent encounter: Secondary | ICD-10-CM

## 2022-06-18 ENCOUNTER — Encounter: Payer: Self-pay | Admitting: Family Medicine

## 2022-06-19 ENCOUNTER — Ambulatory Visit (INDEPENDENT_AMBULATORY_CARE_PROVIDER_SITE_OTHER)
Admission: RE | Admit: 2022-06-19 | Discharge: 2022-06-19 | Disposition: A | Discharge status: Home / Self Care | Payer: BC Managed Care – PPO | Source: Ambulatory Visit | Attending: Physician Assistant | Admitting: Physician Assistant

## 2022-06-19 ENCOUNTER — Ambulatory Visit: Payer: BC Managed Care – PPO | Admitting: Physician Assistant

## 2022-06-19 VITALS — BP 142/84 | HR 78 | Temp 97.5°F | Ht 64.0 in | Wt 211.8 lb

## 2022-06-19 DIAGNOSIS — K5904 Chronic idiopathic constipation: Secondary | ICD-10-CM | POA: Diagnosis not present

## 2022-06-19 DIAGNOSIS — I10 Essential (primary) hypertension: Secondary | ICD-10-CM | POA: Diagnosis not present

## 2022-06-19 DIAGNOSIS — R109 Unspecified abdominal pain: Secondary | ICD-10-CM | POA: Diagnosis not present

## 2022-06-19 DIAGNOSIS — R0781 Pleurodynia: Secondary | ICD-10-CM | POA: Diagnosis not present

## 2022-06-19 NOTE — Patient Instructions (Signed)
It was great to see you!  Take Linzess daily  Please follow-up with Dr [name] in 1-2 weeks to discuss BP and medication(s) refills  If any worsening pain, go to the ER  An order for xray has been put in for you. To have this done, you can walk in at the [facility] location without a scheduled appointment.  The address is [address] It is across the street from [facility]. Lab and x-xray are located in the basement.   Hours of operation are M-F 8:30am to 5:00pm.  Please note that they are closed for lunch between 12:30 and 1:00pm.  Take care,  Jarold Motto PA-C

## 2022-06-19 NOTE — Progress Notes (Signed)
Diane Alexander is a 62 y.o. female here for a new problem.  History of Present Illness:   Chief Complaint  Patient presents with   Acute Visit    Having pain right side started last week , pt is has been medication heels. No change in urine    HPI  Pain in right side Patient reports onset of symptoms on Thursday of last week.  Symptoms have worsened with time. She does get massages monthly and had a new masseuse the week before last and had some increase in muscle Giller pain after this massage.  She did do some stretching on the tile floor that may have contributed to this but denies any specific moment where she had sudden onset or worsening of symptoms  She also suffers greatly from constipation and has been prescribed 72 mcg Linzess and MiraLAX in the past She takes these as needed despite being instructed to take them regularly Her last bowel movement was yesterday and was just a small liquid amount She is taking Ozempic 1 mg weekly Denies vomiting, changes in urination, history of kidney stones, recent URI, fever, chills She has not taken anything for her symptoms, she tries to limit her NSAIDs due to gastritis   HTN Currently taking losartan hydrochlorothiazide 100-25 mg. At home blood pressure readings are: Recently around 140/80. Patient denies chest pain, SOB, blurred vision, dizziness, unusual headaches, lower leg swelling. Patient is compliant with medication. Denies excessive caffeine intake, stimulant usage, excessive alcohol intake, or increase in salt consumption.  BP Readings from Last 3 Encounters:  06/19/22 (!) 142/84  06/13/22 115/78  06/07/22 122/82      Past Medical History:  Diagnosis Date   Allergy    Bilateral primary osteoarthritis of hip 05/22/2019   Constipation    Lumbar arthropathy      Social History   Tobacco Use   Smoking status: Never   Smokeless tobacco: Never  Vaping Use   Vaping Use: Never used  Substance Use Topics   Alcohol use:  No   Drug use: No    Past Surgical History:  Procedure Laterality Date   BREAST CYST EXCISION     CHOLECYSTECTOMY     VAGINAL HYSTERECTOMY      Family History  Problem Relation Age of Onset   Lymphoma Sister 26   Heart disease Mother    Heart attack Father    Cancer Maternal Grandmother    Thyroid disease Neg Hx     No Known Allergies  Current Medications:   Current Outpatient Medications:    azelastine (ASTELIN) 0.1 % nasal spray, Place 2 sprays into both nostrils 2 (two) times daily., Disp: 30 mL, Rfl: 12   calcium carbonate (TUMS - DOSED IN MG ELEMENTAL CALCIUM) 500 MG chewable tablet, Chew 1 tablet by mouth as needed., Disp: , Rfl:    carbamazepine (TEGRETOL-XR) 100 MG 12 hr tablet, Take 1 tablet (100 mg total) by mouth 2 (two) times daily., Disp: 60 tablet, Rfl: 5   celecoxib (CELEBREX) 200 MG capsule, , Disp: , Rfl:    Cholecalciferol (VITAMIN D3) 50 MCG (2000 UT) TABS, Take 2,000 Units by mouth daily., Disp: , Rfl:    diclofenac (VOLTAREN) 75 MG EC tablet, Take 1 tablet by mouth twice daily, Disp: 30 tablet, Rfl: 0   diclofenac sodium (VOLTAREN) 1 % GEL, Apply 2 g topically 4 (four) times daily., Disp: 100 g, Rfl: 1   levothyroxine (SYNTHROID) 125 MCG tablet, Take 1 tablet (125 mcg total)  by mouth daily., Disp: 90 tablet, Rfl: 1   linaclotide (LINZESS) 72 MCG capsule, Take 1 capsule (72 mcg total) by mouth daily before breakfast., Disp: 90 capsule, Rfl: 0   losartan-hydrochlorothiazide (HYZAAR) 100-25 MG tablet, Take 1 tablet by mouth daily., Disp: 90 tablet, Rfl: 1   polyethylene glycol powder (GLYCOLAX/MIRALAX) 17 GM/SCOOP powder, Take 17 g by mouth daily as needed for mild constipation., Disp: , Rfl:    potassium chloride SA (KLOR-CON M) 20 MEQ tablet, Take 1 tablet (20 mEq total) by mouth daily., Disp: 90 tablet, Rfl: 1   pyridOXINE (VITAMIN B6) 25 MG tablet, Take 25 mg by mouth daily., Disp: , Rfl:    Semaglutide, 1 MG/DOSE, 4 MG/3ML SOPN, Inject 1 mg into the skin  once a week., Disp: 3 mL, Rfl: 3   sodium chloride (OCEAN) 0.65 % SOLN nasal spray, Place 1 spray into both nostrils as needed for congestion., Disp: , Rfl:    vitamin B-12 (CYANOCOBALAMIN) 50 MCG tablet, Take 50 mcg by mouth daily., Disp: , Rfl:    ZYRTEC ALLERGY 10 MG tablet, Take 10 mg by mouth at bedtime., Disp: , Rfl:    aspirin 81 MG chewable tablet, Chew 243 mg by mouth once as needed (for chest discomfort). (Patient not taking: Reported on 06/19/2022), Disp: , Rfl:    estradiol (ESTRACE) 2 MG tablet, Take one 2 mg tablet by mouth daily (Patient taking differently: Take 2 mg by mouth daily.), Disp: 90 tablet, Rfl: 0   furosemide (LASIX) 20 MG tablet, Take 1 tablet (20 mg total) by mouth daily as needed for edema. (Patient not taking: Reported on 06/06/2022), Disp: 30 tablet, Rfl: 2   LORazepam (ATIVAN) 0.5 MG tablet, 1-2 tabs 30 - 60 min prior to MRI. Do not drive with this medicine. (Patient not taking: Reported on 06/19/2022), Disp: 4 tablet, Rfl: 0   Review of Systems:   ROS  Vitals:   Vitals:   06/19/22 1540  BP: (!) 142/84  Pulse: 78  Temp: (!) 97.5 F (36.4 C)  SpO2: 96%  Weight: 211 lb 12.8 oz (96.1 kg)  Height: 5\' 4"  (1.626 m)     Body mass index is 36.36 kg/m.  Physical Exam:   Physical Exam Vitals and nursing note reviewed.  Constitutional:      General: She is not in acute distress.    Appearance: She is well-developed. She is not ill-appearing or toxic-appearing.  Cardiovascular:     Rate and Rhythm: Normal rate and regular rhythm.     Pulses: Normal pulses.     Heart sounds: Normal heart sounds, S1 normal and S2 normal.  Pulmonary:     Effort: Pulmonary effort is normal.     Breath sounds: Normal breath sounds.  Abdominal:     General: Abdomen is flat. Bowel sounds are normal.     Palpations: Abdomen is soft.     Tenderness: There is abdominal tenderness. There is guarding. There is no right CVA tenderness, left CVA tenderness or rebound.     Comments:  She is exquisitely tender near her right anterior lower ribs And right flank area  Skin:    General: Skin is warm and dry.  Neurological:     Mental Status: She is alert.     GCS: GCS eye subscore is 4. GCS verbal subscore is 5. GCS motor subscore is 6.  Psychiatric:        Speech: Speech normal.        Behavior: Behavior normal.  Behavior is cooperative.     Assessment and Plan:   Right flank pain Unclear etiology I also had another provider, Dr. Lutricia Horsfall, come in to evaluate patient due to significance of pain DDx includes but is not limited to: costochondritis, constipation, nephrolithiasis, musculoskeletal No evidence of acute abdomen requiring ER evaluation If unexplained symptoms will obtain CT If any worsening of him's, patient was instructed to go to the emergency room  Essential hypertension Above goal today No evidence of end-organ damage on my exam Recommend patient monitor home blood pressure at least a few times weekly Continue losartan-hydrochlorothiazide 100-25 mg daily I have asked her to follow up with Dr. Jacquiline Doe in 1 to 2 weeks to further evaluate need for medication change  Chronic idiopathic constipation Recommend taking Linzess daily and following up with Dr. Jacquiline Doe for further evaluation    Jarold Motto, PA-C

## 2022-06-20 ENCOUNTER — Encounter: Payer: Self-pay | Admitting: Physician Assistant

## 2022-06-20 ENCOUNTER — Telehealth: Payer: Self-pay | Admitting: Family Medicine

## 2022-06-20 ENCOUNTER — Other Ambulatory Visit: Payer: Self-pay | Admitting: *Deleted

## 2022-06-20 LAB — COMPREHENSIVE METABOLIC PANEL
ALT: 29 U/L (ref 0–35)
AST: 21 U/L (ref 0–37)
Albumin: 4.1 g/dL (ref 3.5–5.2)
Alkaline Phosphatase: 54 U/L (ref 39–117)
BUN: 11 mg/dL (ref 6–23)
CO2: 32 mEq/L (ref 19–32)
Calcium: 9.5 mg/dL (ref 8.4–10.5)
Chloride: 97 mEq/L (ref 96–112)
Creatinine, Ser: 0.74 mg/dL (ref 0.40–1.20)
GFR: 86.32 mL/min (ref >60.00)
Glucose, Bld: 90 mg/dL (ref 70–99)
Potassium: 3.3 mEq/L — ABNORMAL LOW (ref 3.5–5.1)
Sodium: 138 mEq/L (ref 135–145)
Total Bilirubin: 0.6 mg/dL (ref 0.2–1.2)
Total Protein: 7.7 g/dL (ref 6.0–8.3)

## 2022-06-20 LAB — URINALYSIS, ROUTINE W REFLEX MICROSCOPIC
Bilirubin Urine: NEGATIVE
Hgb urine dipstick: NEGATIVE
Ketones, ur: NEGATIVE
Leukocytes,Ua: NEGATIVE
Nitrite: NEGATIVE
Specific Gravity, Urine: 1.025 (ref 1.000–1.030)
Total Protein, Urine: NEGATIVE
Urine Glucose: NEGATIVE
Urobilinogen, UA: 0.2 (ref 0.0–1.0)
pH: 6 (ref 5.0–8.0)

## 2022-06-20 LAB — CBC WITH DIFFERENTIAL/PLATELET
Basophils Absolute: 0 10*3/uL (ref 0.0–0.1)
Basophils Relative: 0.7 % (ref 0.0–3.0)
Eosinophils Absolute: 0.1 10*3/uL (ref 0.0–0.7)
Eosinophils Relative: 2.5 % (ref 0.0–5.0)
HCT: 40.2 % (ref 36.0–46.0)
Hemoglobin: 12.9 g/dL (ref 12.0–15.0)
Lymphocytes Relative: 42.3 % (ref 12.0–46.0)
Lymphs Abs: 2.2 10*3/uL (ref 0.7–4.0)
MCHC: 32 g/dL (ref 30.0–36.0)
MCV: 86 fl (ref 78.0–100.0)
Monocytes Absolute: 0.4 10*3/uL (ref 0.1–1.0)
Monocytes Relative: 8.2 % (ref 3.0–12.0)
Neutro Abs: 2.4 10*3/uL (ref 1.4–7.7)
Neutrophils Relative %: 46.3 % (ref 43.0–77.0)
Platelets: 265 10*3/uL (ref 150.0–400.0)
RBC: 4.68 Mil/uL (ref 3.87–5.11)
RDW: 13.6 % (ref 11.5–15.5)
WBC: 5.2 10*3/uL (ref 4.0–10.5)

## 2022-06-20 LAB — LIPASE: Lipase: 48 U/L (ref 11.0–59.0)

## 2022-06-20 LAB — URINE CULTURE
MICRO NUMBER:: 15090853
SPECIMEN QUALITY:: ADEQUATE

## 2022-06-20 MED ORDER — LINACLOTIDE 72 MCG PO CAPS: 72.0000 ug | ORAL_CAPSULE | Freq: Every day | ORAL | 0 refills | Status: DC

## 2022-06-20 NOTE — Telephone Encounter (Signed)
Patient requests to be called to be given lab results and states her symptoms are not improving

## 2022-06-20 NOTE — Telephone Encounter (Signed)
Patient returned call and advised Xray results notes and Samantha recommendations. Pt expressed frustration as to not having any answers. Pt refused going back to ED. Pt not any worse just not any better. Pt wanting to do discuss results with provider before CT scan is ordered. Pt scheduled to see her PCP [name]) on Thursday if still having symptoms.

## 2022-06-20 NOTE — Telephone Encounter (Signed)
Pt called and would like a call back with lab results

## 2022-06-20 NOTE — Telephone Encounter (Signed)
Please advise 

## 2022-06-20 NOTE — Telephone Encounter (Signed)
LVM for patient to return call with call back number.

## 2022-06-21 DIAGNOSIS — M25552 Pain in left hip: Secondary | ICD-10-CM | POA: Diagnosis not present

## 2022-06-21 NOTE — Telephone Encounter (Signed)
Spoke with patient stated constipation is better Still with hip pain.  Will rescheduled appointment with PCP tomorrow for next week

## 2022-06-22 ENCOUNTER — Ambulatory Visit: Payer: BC Managed Care – PPO | Admitting: Family Medicine

## 2022-06-24 ENCOUNTER — Other Ambulatory Visit: Payer: Self-pay | Admitting: Family Medicine

## 2022-06-26 ENCOUNTER — Encounter: Payer: Self-pay | Admitting: Family Medicine

## 2022-06-26 NOTE — Telephone Encounter (Signed)
See other MyChart message.  Katina Degree. [name], MD 06/26/2022 9:40 AM

## 2022-06-26 NOTE — Telephone Encounter (Signed)
Recommend office visit if still not improving.  Diane Alexander. [name], MD 06/26/2022 9:40 AM

## 2022-06-28 ENCOUNTER — Ambulatory Visit: Payer: BC Managed Care – PPO | Admitting: Family Medicine

## 2022-07-03 ENCOUNTER — Other Ambulatory Visit: Payer: Self-pay | Admitting: Family Medicine

## 2022-07-03 ENCOUNTER — Encounter: Payer: Self-pay | Admitting: Family Medicine

## 2022-07-24 DIAGNOSIS — Z1231 Encounter for screening mammogram for malignant neoplasm of breast: Secondary | ICD-10-CM | POA: Diagnosis not present

## 2022-07-24 DIAGNOSIS — Z01419 Encounter for gynecological examination (general) (routine) without abnormal findings: Secondary | ICD-10-CM | POA: Diagnosis not present

## 2022-07-28 DIAGNOSIS — Z6835 Body mass index (BMI) 35.0-35.9, adult: Secondary | ICD-10-CM | POA: Diagnosis not present

## 2022-07-28 DIAGNOSIS — Z01419 Encounter for gynecological examination (general) (routine) without abnormal findings: Secondary | ICD-10-CM | POA: Diagnosis not present

## 2022-07-29 ENCOUNTER — Other Ambulatory Visit: Payer: Self-pay | Admitting: Family Medicine

## 2022-08-09 DIAGNOSIS — Z20822 Contact with and (suspected) exposure to covid-19: Secondary | ICD-10-CM | POA: Diagnosis not present

## 2022-08-09 DIAGNOSIS — E890 Postprocedural hypothyroidism: Secondary | ICD-10-CM | POA: Diagnosis not present

## 2022-08-09 DIAGNOSIS — J302 Other seasonal allergic rhinitis: Secondary | ICD-10-CM | POA: Diagnosis not present

## 2022-08-09 DIAGNOSIS — R051 Acute cough: Secondary | ICD-10-CM | POA: Diagnosis not present

## 2022-08-15 DIAGNOSIS — E079 Disorder of thyroid, unspecified: Secondary | ICD-10-CM | POA: Diagnosis not present

## 2022-08-17 DIAGNOSIS — I10 Essential (primary) hypertension: Secondary | ICD-10-CM | POA: Diagnosis not present

## 2022-08-17 DIAGNOSIS — E890 Postprocedural hypothyroidism: Secondary | ICD-10-CM | POA: Diagnosis not present

## 2022-08-17 DIAGNOSIS — R7301 Impaired fasting glucose: Secondary | ICD-10-CM | POA: Diagnosis not present

## 2022-09-07 ENCOUNTER — Other Ambulatory Visit: Payer: Self-pay | Admitting: Family Medicine

## 2022-10-02 DIAGNOSIS — R079 Chest pain, unspecified: Secondary | ICD-10-CM | POA: Diagnosis not present

## 2022-10-02 DIAGNOSIS — R1012 Left upper quadrant pain: Secondary | ICD-10-CM | POA: Diagnosis not present

## 2022-10-04 ENCOUNTER — Ambulatory Visit: Payer: BC Managed Care – PPO | Admitting: Family Medicine

## 2022-10-11 DIAGNOSIS — R079 Chest pain, unspecified: Secondary | ICD-10-CM | POA: Diagnosis not present

## 2022-10-12 DIAGNOSIS — M1612 Unilateral primary osteoarthritis, left hip: Secondary | ICD-10-CM | POA: Diagnosis not present

## 2022-10-12 DIAGNOSIS — M25552 Pain in left hip: Secondary | ICD-10-CM | POA: Diagnosis not present

## 2022-11-06 DIAGNOSIS — R635 Abnormal weight gain: Secondary | ICD-10-CM | POA: Diagnosis not present

## 2022-11-06 DIAGNOSIS — E890 Postprocedural hypothyroidism: Secondary | ICD-10-CM | POA: Diagnosis not present

## 2022-11-13 ENCOUNTER — Other Ambulatory Visit: Payer: Self-pay | Admitting: Family Medicine

## 2022-11-13 ENCOUNTER — Encounter: Payer: Self-pay | Admitting: Family Medicine

## 2022-11-13 DIAGNOSIS — R7301 Impaired fasting glucose: Secondary | ICD-10-CM | POA: Diagnosis not present

## 2022-11-13 DIAGNOSIS — I10 Essential (primary) hypertension: Secondary | ICD-10-CM | POA: Diagnosis not present

## 2022-11-13 DIAGNOSIS — E890 Postprocedural hypothyroidism: Secondary | ICD-10-CM | POA: Diagnosis not present

## 2022-11-14 ENCOUNTER — Ambulatory Visit (INDEPENDENT_AMBULATORY_CARE_PROVIDER_SITE_OTHER): Payer: BC Managed Care – PPO | Admitting: Plastic Surgery

## 2022-11-14 DIAGNOSIS — S01312A Laceration without foreign body of left ear, initial encounter: Secondary | ICD-10-CM

## 2022-11-14 DIAGNOSIS — Z9889 Other specified postprocedural states: Secondary | ICD-10-CM

## 2022-11-14 NOTE — Telephone Encounter (Signed)
Please schedule anOV 

## 2022-11-14 NOTE — Progress Notes (Signed)
The patient is a 62 year old female here for follow-up on her ear.  She had a tear on her left earlobe that was repaired.  She does not like the way the scar looks.  I do not think I can make it look much different.  She could use some filler that would probably help it a little bit.  We talked about that and she would like to give it a try it would be a few weeks later before she could have the ear piercing.  Plan for filler placement in earlobes and then piercing at a later time.  Pictures were obtained of the patient and placed in the chart with the patient's or guardian's permission.

## 2022-11-14 NOTE — Telephone Encounter (Signed)
Patient has been scheduled for 11/18 @ 1pm.

## 2022-11-20 ENCOUNTER — Ambulatory Visit: Payer: BC Managed Care – PPO | Admitting: Family Medicine

## 2022-11-20 ENCOUNTER — Encounter: Payer: Self-pay | Admitting: Family Medicine

## 2022-11-20 VITALS — BP 106/72 | HR 80 | Temp 98.0°F | Ht 64.0 in | Wt 216.8 lb

## 2022-11-20 DIAGNOSIS — Z7985 Long-term (current) use of injectable non-insulin antidiabetic drugs: Secondary | ICD-10-CM

## 2022-11-20 DIAGNOSIS — E1159 Type 2 diabetes mellitus with other circulatory complications: Secondary | ICD-10-CM

## 2022-11-20 DIAGNOSIS — E559 Vitamin D deficiency, unspecified: Secondary | ICD-10-CM | POA: Diagnosis not present

## 2022-11-20 DIAGNOSIS — I152 Hypertension secondary to endocrine disorders: Secondary | ICD-10-CM

## 2022-11-20 DIAGNOSIS — E1169 Type 2 diabetes mellitus with other specified complication: Secondary | ICD-10-CM | POA: Diagnosis not present

## 2022-11-20 DIAGNOSIS — E669 Obesity, unspecified: Secondary | ICD-10-CM | POA: Diagnosis not present

## 2022-11-20 DIAGNOSIS — Z6837 Body mass index (BMI) 37.0-37.9, adult: Secondary | ICD-10-CM

## 2022-11-20 DIAGNOSIS — E890 Postprocedural hypothyroidism: Secondary | ICD-10-CM

## 2022-11-20 LAB — VITAMIN D 25 HYDROXY (VIT D DEFICIENCY, FRACTURES): VITD: 33.47 ng/mL (ref 30.00–100.00)

## 2022-11-20 MED ORDER — OZEMPIC (2 MG/DOSE) 8 MG/3ML ~~LOC~~ SOPN: 2.0000 mg | PEN_INJECTOR | SUBCUTANEOUS | 3 refills | Status: DC

## 2022-11-20 NOTE — Patient Instructions (Addendum)
It was very nice to see you today!  Please increase your Ozempic to 2 mg weekly.   Please let me know if you have any side effects.  Please come back in 3 months to recheck. Come back sooner if needed.   Return in about 3 months (around 02/20/2023) for Follow Up.   Take care, Dr [name]  PLEASE NOTE:  If you had any lab tests, please let us know if you have not heard back within a few days. You may see your results on mychart before we have a chance to review them but we will give you a call once they are reviewed by [place].   If we ordered any referrals today, please let us know if you have not heard from their office within the next week.   If you had any urgent prescriptions sent in today, please check with the pharmacy within an hour of our visit to make sure the prescription was transmitted appropriately.   Please try these tips to maintain a healthy lifestyle:  Eat at least 3 REAL meals and 1-2 snacks per day.  Aim for no more than 5 hours between eating.  If you eat breakfast, please do so within one hour of getting up.   Each meal should contain half fruits/vegetables, one quarter protein, and one quarter carbs (no bigger than a computer mouse)  Cut down on sweet beverages. This includes juice, soda, and sweet tea.   Drink at least 1 glass of water with each meal and aim for at least 8 glasses per day  Exercise at least 150 minutes every week.

## 2022-11-20 NOTE — Progress Notes (Signed)
   Diane Alexander is a 62 y.o. female who presents today for an office visit.  Assessment/Plan:  Chronic Problems Addressed Today: Type 2 diabetes mellitus with other specified complication (HCC) Last A1c 5.7 though still is continuing to gain weight and having carbs and sweets cravings.  She is trying to work on lifestyle modifications.  We will increase her Ozempic to 2 mg weekly.  Recheck in 3 months.  Obesity (BMI 30-39.9) She is up about 9 pounds since last visit.  We will increase Ozempic to 2 mg weekly.  Follow-up in 3 months.  Hypertension associated with diabetes (HCC) Blood pressure on low side today.  Will continue current dose losartan/HCTZ 100-25 daily.  She will continue monitor at home.  May be able to decrease dose in the future if continues to be well-controlled.  Vitamin D deficiency, taking 2000 IU daily Check vitamin D  Hypothyroidism following RAI 12/2016, on Levothyroxine Continue management per endocrinology.  They are adjusting her dose of Synthroid.  Last TSH was slightly elevated.     Subjective:  HPI:  See A/P for status of chronic conditions.  Patient is here today for follow-up.  She was last seen here about 9 months ago.  Since her last visit, she has been following with endocrinology. Her most recent A1c was 5.7.   She has also had some issues with her thyroid levels and they have been adjusting the dose of her thyroid medications. She is still having intermittent issues with hot flashes. She does admit to have some issue with being consistent with taking her medications and has missed several doses. She has been trying to stay active and eat healthy. She has continued to gain weight.       Objective:  Physical Exam: BP 106/72   Pulse 80   Temp 98 F (36.7 C) (Temporal)   Ht 5\' 4"  (1.626 m)   Wt 216 lb 12.8 oz (98.3 kg)   LMP  (LMP Unknown)   SpO2 (!) 66%   BMI 37.21 kg/m   Wt Readings from Last 3 Encounters:  11/20/22 216 lb 12.8 oz (98.3 kg)   06/19/22 211 lb 12.8 oz (96.1 kg)  06/13/22 209 lb 9.6 oz (95.1 kg)  Gen: No acute distress, resting comfortably CV: Regular rate and rhythm with no murmurs appreciated Pulm: Normal work of breathing, clear to auscultation bilaterally with no crackles, wheezes, or rhonchi Neuro: Grossly normal, moves all extremities Psych: Normal affect and thought content      [name] M. [name], MD 11/20/2022 2:07 PM

## 2022-11-20 NOTE — Assessment & Plan Note (Signed)
She is up about 9 pounds since last visit.  We will increase Ozempic to 2 mg weekly.  Follow-up in 3 months.

## 2022-11-20 NOTE — Assessment & Plan Note (Signed)
Blood pressure on low side today.  Will continue current dose losartan/HCTZ 100-25 daily.  She will continue monitor at home.  May be able to decrease dose in the future if continues to be well-controlled.

## 2022-11-20 NOTE — Assessment & Plan Note (Signed)
Check vitamin D. 

## 2022-11-20 NOTE — Assessment & Plan Note (Signed)
Last A1c 5.7 though still is continuing to gain weight and having carbs and sweets cravings.  She is trying to work on lifestyle modifications.  We will increase her Ozempic to 2 mg weekly.  Recheck in 3 months.

## 2022-11-20 NOTE — Assessment & Plan Note (Signed)
Continue management per endocrinology.  They are adjusting her dose of Synthroid.  Last TSH was slightly elevated.

## 2022-11-21 LAB — COMPREHENSIVE METABOLIC PANEL
ALT: 19 U/L (ref 0–35)
AST: 19 U/L (ref 0–37)
Albumin: 4.1 g/dL (ref 3.5–5.2)
Alkaline Phosphatase: 57 U/L (ref 39–117)
BUN: 14 mg/dL (ref 6–23)
CO2: 33 meq/L — ABNORMAL HIGH (ref 19–32)
Calcium: 9.7 mg/dL (ref 8.4–10.5)
Chloride: 96 meq/L (ref 96–112)
Creatinine, Ser: 0.76 mg/dL (ref 0.40–1.20)
GFR: 83.36 mL/min (ref >60.00)
Glucose, Bld: 91 mg/dL (ref 70–99)
Potassium: 3.1 meq/L — ABNORMAL LOW (ref 3.5–5.1)
Sodium: 138 meq/L (ref 135–145)
Total Bilirubin: 0.6 mg/dL (ref 0.2–1.2)
Total Protein: 7.4 g/dL (ref 6.0–8.3)

## 2022-11-21 NOTE — Progress Notes (Signed)
Her potassium is still low.  This may be due to her blood pressure medication.  Recommend that she decrease her losartan/HCTZ to half a tablet daily and come back in 1 to 2 weeks to recheck  Can we verify that she is taking her potassium supplementation as well?

## 2022-11-23 ENCOUNTER — Encounter: Payer: Self-pay | Admitting: Family Medicine

## 2022-11-23 NOTE — Telephone Encounter (Signed)
Please verified

## 2022-11-23 NOTE — Progress Notes (Signed)
She does not need to do anything specific for this.  This is usually related to the way that the blood is drawn.  This may also improve with medication changes.  We can recheck this again when she comes back in to recheck her potassium.

## 2022-11-24 ENCOUNTER — Other Ambulatory Visit: Payer: Self-pay | Admitting: *Deleted

## 2022-11-24 MED ORDER — OZEMPIC (2 MG/DOSE) 8 MG/3ML ~~LOC~~ SOPN: 2.0000 mg | PEN_INJECTOR | SUBCUTANEOUS | 3 refills | Status: DC

## 2022-11-24 NOTE — Telephone Encounter (Signed)
She is on 2 mg weekly and we sent in the 8 mg/49ml pen.  She should continue with this dose.  Recommend she discuss with the pharmacist at [facility] is she has other questions about the way they filled it.  Katina Degree. [name], MD 11/24/2022 12:22 PM

## 2022-11-28 ENCOUNTER — Ambulatory Visit (INDEPENDENT_AMBULATORY_CARE_PROVIDER_SITE_OTHER): Payer: Self-pay | Admitting: Plastic Surgery

## 2022-11-28 DIAGNOSIS — S01312A Laceration without foreign body of left ear, initial encounter: Secondary | ICD-10-CM

## 2022-11-28 DIAGNOSIS — Z719 Counseling, unspecified: Secondary | ICD-10-CM

## 2022-11-28 NOTE — Progress Notes (Signed)
Procedure Note  Preoperative Dx: Volume loss of earlobes  Postoperative Dx: Same  Procedure: Filler to bilateral earlobes   Description of Procedure: Risks and complications were explained to the patient.  Consent was confirmed and the patient understands the risks and benefits.  The potential complications and alternatives were explained and the patient consents.  The patient expressed understanding the option of not having the procedure and the risks of a scar.  Time out was called and all information was confirmed to be correct.    The area was prepped with alcohol on each earlobe.  Santiago Bumpers, XC 0.55 mL was used with 0.35 placed in the left ear and the remaining amount in the right earlobe.  A dressing was applied.  The patient was given instructions on how to care for the area and a follow up appointment.  Samari tolerated the procedure well and there were no complications.  Volbella XC 0.55 ml  Lot: [id number]

## 2022-12-04 ENCOUNTER — Ambulatory Visit: Payer: BC Managed Care – PPO | Admitting: Family Medicine

## 2022-12-04 ENCOUNTER — Encounter: Payer: Self-pay | Admitting: Family Medicine

## 2022-12-04 VITALS — BP 118/82 | HR 72 | Temp 98.2°F | Ht 64.0 in | Wt 217.0 lb

## 2022-12-04 DIAGNOSIS — N760 Acute vaginitis: Secondary | ICD-10-CM | POA: Diagnosis not present

## 2022-12-04 DIAGNOSIS — E1159 Type 2 diabetes mellitus with other circulatory complications: Secondary | ICD-10-CM

## 2022-12-04 DIAGNOSIS — R300 Dysuria: Secondary | ICD-10-CM

## 2022-12-04 DIAGNOSIS — Z6837 Body mass index (BMI) 37.0-37.9, adult: Secondary | ICD-10-CM

## 2022-12-04 DIAGNOSIS — E669 Obesity, unspecified: Secondary | ICD-10-CM | POA: Diagnosis not present

## 2022-12-04 DIAGNOSIS — E876 Hypokalemia: Secondary | ICD-10-CM

## 2022-12-04 DIAGNOSIS — I152 Hypertension secondary to endocrine disorders: Secondary | ICD-10-CM

## 2022-12-04 LAB — COMPREHENSIVE METABOLIC PANEL
ALT: 17 U/L (ref 0–35)
AST: 18 U/L (ref 0–37)
Albumin: 3.9 g/dL (ref 3.5–5.2)
Alkaline Phosphatase: 54 U/L (ref 39–117)
BUN: 11 mg/dL (ref 6–23)
CO2: 29 meq/L (ref 19–32)
Calcium: 9.3 mg/dL (ref 8.4–10.5)
Chloride: 100 meq/L (ref 96–112)
Creatinine, Ser: 0.75 mg/dL (ref 0.40–1.20)
GFR: 84.67 mL/min (ref >60.00)
Glucose, Bld: 79 mg/dL (ref 70–99)
Potassium: 3.4 meq/L — ABNORMAL LOW (ref 3.5–5.1)
Sodium: 137 meq/L (ref 135–145)
Total Bilirubin: 0.6 mg/dL (ref 0.2–1.2)
Total Protein: 7.4 g/dL (ref 6.0–8.3)

## 2022-12-04 NOTE — Patient Instructions (Addendum)
It was very nice to see you today!  I am glad you are doing well.  Will check blood work and a urine sample.  Return if symptoms worsen or fail to improve.   Take care, Dr [name]  PLEASE NOTE:  If you had any lab tests, please let us know if you have not heard back within a few days. You may see your results on mychart before we have a chance to review them but we will give you a call once they are reviewed by [place].   If we ordered any referrals today, please let us know if you have not heard from their office within the next week.   If you had any urgent prescriptions sent in today, please check with the pharmacy within an hour of our visit to make sure the prescription was transmitted appropriately.   Please try these tips to maintain a healthy lifestyle:  Eat at least 3 REAL meals and 1-2 snacks per day.  Aim for no more than 5 hours between eating.  If you eat breakfast, please do so within one hour of getting up.   Each meal should contain half fruits/vegetables, one quarter protein, and one quarter carbs (no bigger than a computer mouse)  Cut down on sweet beverages. This includes juice, soda, and sweet tea.   Drink at least 1 glass of water with each meal and aim for at least 8 glasses per day  Exercise at least 150 minutes every week.

## 2022-12-04 NOTE — Assessment & Plan Note (Signed)
She has done well with increased dose of Ozempic 2 mg weekly though too early to have much of an impact on weight.  She will come back to see me in a few months.  She will let us know if she has any issues with a higher dose.

## 2022-12-04 NOTE — Progress Notes (Signed)
   Diane Alexander is a 62 y.o. female who presents today for an office visit.  Assessment/Plan:  New/Acute Problems: Hypokalemia Recheck c-Met.  She is now on losartan/HCTZ 50-12.5 once daily and has been consistent with her potassium supplementation 20 meq daily.  Dysuria No red flags.  Check UA and urine culture.  Chronic Problems Addressed Today: Hypertension associated with diabetes (HCC) Blood pressure at goal today on losartan/HCTZ 50-12.5 daily.  She will continue to monitor at home.  If she continues to be well-controlled we can continue to wean down or discontinue this.  We are rechecking labs today.  Obesity (BMI 30-39.9) She has done well with increased dose of Ozempic 2 mg weekly though too early to have much of an impact on weight.  She will come back to see me in a few months.  She will let us know if she has any issues with a higher dose.     Subjective:  HPI:  Patient here today for follow-up.  Last saw her about 3 weeks ago.  At that time we checked labs which did show persistent low potassium.  We recommended that she decrease her losartan/HCTZ to half a tablet daily and come back here for recheck.  She has been doing well with half a tablet. She has been taking her potassium supplement.   She did have a few days of burning with urination.  This seems to be resolving.  No fevers or chills.       Objective:  Physical Exam: BP 118/82   Pulse 72   Temp 98.2 F (36.8 C) (Temporal)   Ht 5\' 4"  (1.626 m)   Wt 217 lb (98.4 kg)   LMP  (LMP Unknown)   SpO2 98%   BMI 37.25 kg/m   Wt Readings from Last 3 Encounters:  12/04/22 217 lb (98.4 kg)  11/20/22 216 lb 12.8 oz (98.3 kg)  06/19/22 211 lb 12.8 oz (96.1 kg)  Gen: No acute distress, resting comfortably Neuro: Grossly normal, moves all extremities Psych: Normal affect and thought content      Larisa Lanius M. [name], MD 12/04/2022 11:11 AM

## 2022-12-04 NOTE — Assessment & Plan Note (Signed)
Blood pressure at goal today on losartan/HCTZ 50-12.5 daily.  She will continue to monitor at home.  If she continues to be well-controlled we can continue to wean down or discontinue this.  We are rechecking labs today.

## 2022-12-05 LAB — URINE CULTURE
MICRO NUMBER:: 15796588
SPECIMEN QUALITY:: ADEQUATE

## 2022-12-06 NOTE — Progress Notes (Signed)
Urine culture shows no sign of urinary tract infection.   Her potassium is improving but not quite back to normal. We can continue her current regimen for now but I would like to have her come back for one more recheck in 4-6 weeks.

## 2022-12-07 ENCOUNTER — Other Ambulatory Visit: Payer: Self-pay | Admitting: Family Medicine

## 2022-12-12 ENCOUNTER — Ambulatory Visit: Payer: BC Managed Care – PPO | Admitting: Plastic Surgery

## 2022-12-12 VITALS — BP 122/80 | HR 94

## 2022-12-12 DIAGNOSIS — S01312A Laceration without foreign body of left ear, initial encounter: Secondary | ICD-10-CM

## 2022-12-12 DIAGNOSIS — Z719 Counseling, unspecified: Secondary | ICD-10-CM

## 2022-12-12 NOTE — Progress Notes (Signed)
Is a lovely 62 year old female.  She had her earlobe repaired on the left.  We then did some filling to help with the overall symmetry and contour.  She is very pleased with the way that it looks.  I think it would be safest to give her just 2 more weeks before we repierce that left ear.  Patient is in agreement.  Will see her back in 2 weeks.

## 2022-12-25 ENCOUNTER — Ambulatory Visit (INDEPENDENT_AMBULATORY_CARE_PROVIDER_SITE_OTHER): Payer: Self-pay | Admitting: Plastic Surgery

## 2022-12-25 ENCOUNTER — Encounter: Payer: Self-pay | Admitting: Plastic Surgery

## 2022-12-25 DIAGNOSIS — S01312A Laceration without foreign body of left ear, initial encounter: Secondary | ICD-10-CM

## 2022-12-25 DIAGNOSIS — Z719 Counseling, unspecified: Secondary | ICD-10-CM

## 2022-12-25 NOTE — Progress Notes (Signed)
Procedure Note  Preoperative Dx: torn left ear lobe  Postoperative Dx: Same  Procedure: left ear piercing   Description of Procedure: Risks and complications were explained to the patient.  Consent was confirmed and the patient understands the risks and benefits.  The potential complications and alternatives were explained and the patient consents.  The patient expressed understanding the option of not having the procedure and the risks of a scar.  Time out was called and all information was confirmed to be correct.    The area was prepped and marked.  The patient agreed on the location. The patient was given instructions on how to care for the area and a follow up appointment.  Diane Alexander tolerated the procedure well and there were no complications.

## 2022-12-26 ENCOUNTER — Ambulatory Visit: Payer: BC Managed Care – PPO | Admitting: Plastic Surgery

## 2023-01-16 ENCOUNTER — Encounter: Payer: Self-pay | Admitting: Family Medicine

## 2023-01-17 ENCOUNTER — Ambulatory Visit (INDEPENDENT_AMBULATORY_CARE_PROVIDER_SITE_OTHER): Payer: BC Managed Care – PPO

## 2023-01-17 DIAGNOSIS — Z23 Encounter for immunization: Secondary | ICD-10-CM | POA: Diagnosis not present

## 2023-01-17 NOTE — Telephone Encounter (Signed)
 Copied from CRM [id number]. Topic: Clinical - Medical Advice >> Jan 16, 2023  2:51 PM Corin V wrote: Reason for CRM: Patient is calling to confirm the name of the vaccination that patient should get when over the age of 73. She would like to also know if it is something that can be done in office so she can get scheduled or if she can be provided with a location that will do this type of vaccinatio  Schedule a nurse visit for immunization

## 2023-01-17 NOTE — Progress Notes (Signed)
 Patient is in office today for a nurse visit for  Prevnar 20 , per PCP's order. Patient Injection was given in the  Left deltoid. Patient tolerated injection well.

## 2023-01-17 NOTE — Telephone Encounter (Signed)
 Copied from CRM [id number]. Topic: Clinical - Lab/Test Results >> Jan 16, 2023  1:20 PM Diane Alexander B wrote: Reason for CRM: Pt would like some to give her a callback regarding if she can come to the office and receive the Pneumococcal pneumonia Shot

## 2023-01-17 NOTE — Patient Instructions (Signed)
nur

## 2023-01-24 ENCOUNTER — Other Ambulatory Visit: Payer: Self-pay | Admitting: Family Medicine

## 2023-01-24 DIAGNOSIS — I10 Essential (primary) hypertension: Secondary | ICD-10-CM

## 2023-01-25 ENCOUNTER — Encounter: Payer: Self-pay | Admitting: Family Medicine

## 2023-02-01 ENCOUNTER — Encounter: Payer: Self-pay | Admitting: Family Medicine

## 2023-02-01 NOTE — Telephone Encounter (Signed)
Please see patient message and advise if you like her to schedule an appointment

## 2023-02-01 NOTE — Telephone Encounter (Signed)
This is very unlikely to be a blood clot that will cause any issues though if she is having significant pain or if symptoms are not improving then recommend she come in for an office visit.  Katina Degree. [name], MD 02/01/2023 10:50 AM

## 2023-02-08 ENCOUNTER — Encounter: Payer: Self-pay | Admitting: Family Medicine

## 2023-02-08 NOTE — Telephone Encounter (Signed)
**Note De-identified  [place] Obfuscation** Please advise 

## 2023-02-08 NOTE — Telephone Encounter (Signed)
 I am not sure what she is talking about with the [facility] video. Can we get more info?  Regarding he Ozempic , it is being used to help her with appetite. She did have a sugar in the diabetic range a couple of years ago but all of her most recent readings have been normal.  Worth M. Kennyth, MD 02/08/2023 2:19 PM

## 2023-02-27 ENCOUNTER — Other Ambulatory Visit: Payer: Self-pay | Admitting: *Deleted

## 2023-02-27 NOTE — Telephone Encounter (Signed)
 Copied from CRM [id number]. Topic: General - Other >> Feb 26, 2023  4:46 PM Corin V wrote: Patient called again and asked for a call back about this message before the end of the day  Spoke with patient, stated is busy at this time  Will call back about 3 pm today  [facility]

## 2023-02-27 NOTE — Telephone Encounter (Signed)
 Spoke with patient, stated her stool is color green  Taking liquid tumeric possible change her stool color  No pain No symptoms  Stop taking medication due to stool color changes,  Has question about Rx ozempic not losing weight with medication  Notice her stomach is getting bigger  Advise to schedule an appointment with PCP, appointment schedule

## 2023-02-27 NOTE — Telephone Encounter (Signed)
 Copied from CRM [id number]. Topic: General - Other >> Feb 26, 2023 11:32 AM Truddie Crumble wrote: Reason for CRM: patient called stating she left a mychart message and wanted to make sure that her doctor gets the note and call her back

## 2023-03-05 ENCOUNTER — Ambulatory Visit: Payer: BC Managed Care – PPO | Admitting: Family Medicine

## 2023-03-12 ENCOUNTER — Encounter: Payer: Self-pay | Admitting: Family Medicine

## 2023-03-12 ENCOUNTER — Ambulatory Visit: Admitting: Family Medicine

## 2023-03-12 VITALS — BP 120/78 | HR 91 | Temp 97.0°F | Ht 64.0 in | Wt 212.8 lb

## 2023-03-12 DIAGNOSIS — E1169 Type 2 diabetes mellitus with other specified complication: Secondary | ICD-10-CM | POA: Diagnosis not present

## 2023-03-12 DIAGNOSIS — M2550 Pain in unspecified joint: Secondary | ICD-10-CM | POA: Diagnosis not present

## 2023-03-12 DIAGNOSIS — Z7985 Long-term (current) use of injectable non-insulin antidiabetic drugs: Secondary | ICD-10-CM

## 2023-03-12 DIAGNOSIS — E669 Obesity, unspecified: Secondary | ICD-10-CM

## 2023-03-12 DIAGNOSIS — E559 Vitamin D deficiency, unspecified: Secondary | ICD-10-CM

## 2023-03-12 DIAGNOSIS — R2242 Localized swelling, mass and lump, left lower limb: Secondary | ICD-10-CM

## 2023-03-12 DIAGNOSIS — M160 Bilateral primary osteoarthritis of hip: Secondary | ICD-10-CM

## 2023-03-12 DIAGNOSIS — I10 Essential (primary) hypertension: Secondary | ICD-10-CM

## 2023-03-12 LAB — BASIC METABOLIC PANEL
BUN: 9 mg/dL (ref 6–23)
CO2: 30 meq/L (ref 19–32)
Calcium: 9.5 mg/dL (ref 8.4–10.5)
Chloride: 101 meq/L (ref 96–112)
Creatinine, Ser: 0.72 mg/dL (ref 0.40–1.20)
GFR: 88.75 mL/min (ref >60.00)
Glucose, Bld: 90 mg/dL (ref 70–99)
Potassium: 3.5 meq/L (ref 3.5–5.1)
Sodium: 139 meq/L (ref 135–145)

## 2023-03-12 LAB — VITAMIN D 25 HYDROXY (VIT D DEFICIENCY, FRACTURES): VITD: 36.55 ng/mL (ref 30.00–100.00)

## 2023-03-12 LAB — HEMOGLOBIN A1C: Hgb A1c MFr Bld: 5.1 % (ref 4.6–6.5)

## 2023-03-12 MED ORDER — CYCLOBENZAPRINE HCL 5 MG PO TABS: 5.0000 mg | ORAL_TABLET | Freq: Three times a day (TID) | ORAL | 1 refills | Status: DC | PRN

## 2023-03-12 MED ORDER — TIRZEPATIDE 15 MG/0.5ML ~~LOC~~ SOAJ: 15.0000 mg | SUBCUTANEOUS | 0 refills | Status: DC

## 2023-03-12 NOTE — Assessment & Plan Note (Signed)
 Blood pressure at goal today on HCTZ 50-12.5 daily.

## 2023-03-12 NOTE — Assessment & Plan Note (Signed)
 Check vitamin D.

## 2023-03-12 NOTE — Assessment & Plan Note (Signed)
 Had extensive discussion with patient today regarding her multiple joint pain.  She is currently following with orthopedics and has been diagnosed with osteoarthritis.  They are recommending a hip replacement in her left hip at some point in the near future.  She does have a lot of ongoing issues with muscle tightness especially in her thighs bilaterally.  Will add on Flexeril 5 mg 3 times daily as needed.  We discussed potential side effects.  She will continue her diclofenac and Tylenol as well.  We did discuss potentially switching her diclofenac to Celebrex however we will hold off on this for now.  Will defer further management to orthopedics.

## 2023-03-12 NOTE — Patient Instructions (Addendum)
 It was very nice to see you today!  We will switch your Ozempic to [facility].   Please try the flexeril.  We will check on ultrasound on your leg.   We will check blood work today.  Return if symptoms worsen or fail to improve.   Take care, Dr [name]  PLEASE NOTE:  If you had any lab tests, please let us know if you have not heard back within a few days. You may see your results on mychart before we have a chance to review them but we will give you a call once they are reviewed by [place].   If we ordered any referrals today, please let us know if you have not heard from their office within the next week.   If you had any urgent prescriptions sent in today, please check with the pharmacy within an hour of our visit to make sure the prescription was transmitted appropriately.   Please try these tips to maintain a healthy lifestyle:  Eat at least 3 REAL meals and 1-2 snacks per day.  Aim for no more than 5 hours between eating.  If you eat breakfast, please do so within one hour of getting up.   Each meal should contain half fruits/vegetables, one quarter protein, and one quarter carbs (no bigger than a computer mouse)  Cut down on sweet beverages. This includes juice, soda, and sweet tea.   Drink at least 1 glass of water with each meal and aim for at least 8 glasses per day  Exercise at least 150 minutes every week.

## 2023-03-12 NOTE — Assessment & Plan Note (Signed)
 Check A1c today.  She is not having as much weight loss that she would like and Ozempic.  We will switch this to [facility].  She is aware of potential side effects.  Recheck A1c in 3 to 6 months.

## 2023-03-12 NOTE — Progress Notes (Signed)
 Diane Alexander is a 63 y.o. female who presents today for an office visit.  Assessment/Plan:  New/Acute Problems: Left Thigh Mass No appreciable mass on exam today however given the length of symptoms would be reasonable for [place] to check ultrasound to further evaluate.  Will place order today.  It is possible this could be related to her musculoskeletal issues as well.  Will be addressing this as below.  Chronic Problems Addressed Today: Type 2 diabetes mellitus with other specified complication (HCC) Check A1c today.  She is not having as much weight loss that she would like and Ozempic.  We will switch this to [facility].  She is aware of potential side effects.  Recheck A1c in 3 to 6 months.  Obesity (BMI 30-39.9) BMI 36.5 with comorbidities.  She is on Ozempic 2 mg weekly for her diabetes.  She has not been able to lose as much weight she is she would like.  Will switch to [facility].  She is aware of potential side effects.  Follow-up again in a few months.  Essential hypertension Blood pressure at goal today on HCTZ 50-12.5 daily.  Multiple joint pain Had extensive discussion with patient today regarding her multiple joint pain.  She is currently following with orthopedics and has been diagnosed with osteoarthritis.  They are recommending a hip replacement in her left hip at some point in the near future.  She does have a lot of ongoing issues with muscle tightness especially in her thighs bilaterally.  Will add on Flexeril 5 mg 3 times daily as needed.  We discussed potential side effects.  She will continue her diclofenac and Tylenol as well.  We did discuss potentially switching her diclofenac to Celebrex however we will hold off on this for now.  Will defer further management to orthopedics.  Vitamin D deficiency Check vitamin D.     Subjective:  HPI:  See Assessment / plan for status of chronic conditions.  She has no acute concerns today. She has been consistent with her  medications. She is trying to lose weight though this has been difficult.  She has been consistent with her Ozempic.  She is trying to eat a healthy diet however has had difficulty with this.  She does have quite a bit of chronic pain and most of her joints especially in her left leg.  She is following with orthopedics.  This does limit her ability to be as active as she would like.  She noticed a lump in her left inner thigh several weeks ago.  Painful to palpation.  She has been getting massages which does seem to help.  No other obvious injuries or precipitating events.      Objective:  Physical Exam: BP 120/78   Pulse 91   Temp (!) 97 F (36.1 C) (Temporal)   Ht 5\' 4"  (1.626 m)   Wt 212 lb 12.8 oz (96.5 kg)   LMP  (LMP Unknown)   SpO2 98%   BMI 36.53 kg/m   Wt Readings from Last 3 Encounters:  03/12/23 212 lb 12.8 oz (96.5 kg)  12/04/22 217 lb (98.4 kg)  11/20/22 216 lb 12.8 oz (98.3 kg)    Gen: No acute distress, resting comfortably MUSCULOSKELETAL: Left inner thigh without obvious masses or lesions. Neuro: Grossly normal, moves all extremities Psych: Normal affect and thought content  Time Spent: 40 minutes of total time was spent on the date of the encounter performing the following actions: chart review prior to  seeing the patient, obtaining history, performing a medically necessary exam, counseling on the treatment plan, placing orders, and documenting in our EHR.        Katina Degree. [name], MD 03/12/2023 1:50 PM

## 2023-03-12 NOTE — Assessment & Plan Note (Signed)
 BMI 36.5 with comorbidities.  She is on Ozempic 2 mg weekly for her diabetes.  She has not been able to lose as much weight she is she would like.  Will switch to [facility].  She is aware of potential side effects.  Follow-up again in a few months.

## 2023-03-13 ENCOUNTER — Encounter: Payer: Self-pay | Admitting: Family Medicine

## 2023-03-13 NOTE — Progress Notes (Signed)
 Her labs are all at goal.  We should see her back in 3 months to recheck her A1c.  Do not need to make any other changes to treatment plan at this time.

## 2023-03-26 DIAGNOSIS — N760 Acute vaginitis: Secondary | ICD-10-CM | POA: Diagnosis not present

## 2023-04-16 ENCOUNTER — Ambulatory Visit: Payer: Self-pay

## 2023-04-16 ENCOUNTER — Ambulatory Visit: Admitting: Emergency Medicine

## 2023-04-16 ENCOUNTER — Ambulatory Visit (INDEPENDENT_AMBULATORY_CARE_PROVIDER_SITE_OTHER)

## 2023-04-16 ENCOUNTER — Encounter: Payer: Self-pay | Admitting: Emergency Medicine

## 2023-04-16 VITALS — HR 75 | Temp 97.7°F | Ht 64.0 in | Wt 213.2 lb

## 2023-04-16 DIAGNOSIS — M25511 Pain in right shoulder: Secondary | ICD-10-CM | POA: Insufficient documentation

## 2023-04-16 DIAGNOSIS — M19011 Primary osteoarthritis, right shoulder: Secondary | ICD-10-CM | POA: Diagnosis not present

## 2023-04-16 MED ORDER — ACETAMINOPHEN-CODEINE 300-30 MG PO TABS: 1.0000 | ORAL_TABLET | Freq: Four times a day (QID) | ORAL | 0 refills | Status: AC | PRN

## 2023-04-16 NOTE — Assessment & Plan Note (Signed)
 Clinically stable.  Full range of motion during exam but complaining of severe pain. Differential diagnosis discussed. Will do x-ray today and review images when ready Pain management discussed Has been taking Tylenol and diclofenac without much relief Recommend Tylenol with codeine No. 3 as needed Needs orthopedic evaluation. Has appointment to see orthopedist in 2 days. Advised to also follow-up with PCP

## 2023-04-16 NOTE — Progress Notes (Signed)
 Diane Alexander 63 y.o.   Chief Complaint  Patient presents with   Shoulder Pain    Right shoulder pain, been going for about a week, pain seems to be through out the day. Pt has been using over the counter med like tylenol and also ice/ hot pack which seems to be helping.    HISTORY OF PRESENT ILLNESS: Acute problem visit today. This is a 63 y.o. female complaining of pain to right shoulder for the past week.  Denies injury. Sharp localized pain sometimes traveling down the right arm.  No associated symptoms. History of arthritis to her hips Has tried cold and warm compresses, Tylenol, diclofenac with little relief. Hurts to move.  Has appointment to see orthopedist next Wednesday. No other complaints or medical concerns today.  Shoulder Pain  Pertinent negatives include no fever.     Prior to Admission medications   Medication Sig Start Date End Date Taking? Authorizing Provider  aspirin 81 MG chewable tablet Chew 243 mg by mouth once as needed (for chest discomfort).   Yes [provider]  azelastine (ASTELIN) 0.1 % nasal spray Place 2 sprays into both nostrils 2 (two) times daily. 11/01/21  Yes Rodney Clamp, MD  calcium carbonate (TUMS - DOSED IN MG ELEMENTAL CALCIUM) 500 MG chewable tablet Chew 1 tablet by mouth as needed.   Yes [provider]  carbamazepine (TEGRETOL-XR) 100 MG 12 hr tablet Take 1 tablet (100 mg total) by mouth 2 (two) times daily. 07/11/21  Yes Rodney Clamp, MD  celecoxib (CELEBREX) 200 MG capsule    Yes [provider]  Cholecalciferol (VITAMIN D3) 50 MCG (2000 UT) TABS Take 2,000 Units by mouth daily.   Yes [provider]  cyclobenzaprine (FLEXERIL) 5 MG tablet Take 1 tablet (5 mg total) by mouth 3 (three) times daily as needed for muscle spasms. 03/12/23  Yes Parker, Caleb M, MD  diclofenac (VOLTAREN) 75 MG EC tablet Take 1 tablet by mouth twice daily 02/17/21  Yes Luevenia Saha, MD  diclofenac sodium (VOLTAREN) 1 %  GEL Apply 2 g topically 4 (four) times daily. 08/21/18  Yes Burchette, Marijean Shouts, MD  estradiol (ESTRACE) 2 MG tablet Take one 2 mg tablet by mouth daily Patient taking differently: Take 2 mg by mouth daily. 06/05/19  Yes Luevenia Saha, MD  furosemide (LASIX) 20 MG tablet Take 1 tablet (20 mg total) by mouth daily as needed for edema. 02/09/20  Yes Luevenia Saha, MD  levothyroxine (SYNTHROID) 125 MCG tablet Take 1 tablet (125 mcg total) by mouth daily. 02/10/22  Yes Rodney Clamp, MD  linaclotide [facility]) 72 MCG capsule Take 1 capsule (72 mcg total) by mouth daily before breakfast. 06/20/22  Yes Rodney Clamp, MD  LORazepam (ATIVAN) 0.5 MG tablet 1-2 tabs 30 - 60 min prior to MRI. Do not drive with this medicine. 08/16/20  Yes Corey, Evan S, MD  losartan-hydrochlorothiazide (HYZAAR) 100-25 MG tablet TAKE ONE TABLET BY MOUTH ONCE A DAY 01/25/23  Yes Parker, Caleb M, MD  polyethylene glycol powder (GLYCOLAX/MIRALAX) 17 GM/SCOOP powder Take 17 g by mouth daily as needed for mild constipation.   Yes [provider]  potassium chloride SA (KLOR-CON M) 20 MEQ tablet TAKE ONE TABLET BY MOUTH ONCE A DAY 12/07/22  Yes Rodney Clamp, MD  pyridOXINE (VITAMIN B6) 25 MG tablet Take 25 mg by mouth daily.   Yes [provider]  sodium chloride (OCEAN) 0.65 % SOLN nasal spray  Place 1 spray into both nostrils as needed for congestion.   Yes [provider]  tirzepatide [facility]en) 15 MG/0.5ML Pen Inject 15 mg into the skin once a week. 03/12/23  Yes Ardith Dark, MD  vitamin B-12 (CYANOCOBALAMIN) 50 MCG tablet Take 50 mcg by mouth daily.   Yes [provider]  ZYRTEC ALLERGY 10 MG tablet Take 10 mg by mouth at bedtime.   Yes [provider]    No Known Allergies  Patient Active Problem List   Diagnosis Date Noted   Morbid obesity (HCC) 01/17/2023   Right Achilles tendinitis 04/17/2022   Acute ankle pain 04/03/2022   Pain in joint of right ankle 03/31/2022   Torn  ear lobe, left, initial encounter 02/24/2022   Pain of left thumb 01/19/2022   Type 2 diabetes mellitus with other specified complication (HCC) 04/25/2021   Osteoarthritis of left hip 04/16/2021   Pain of left hip joint 04/14/2021   Hemangioma of vertebral column 09/09/2020   Degeneration of lumbar intervertebral disc 03/19/2020   Bilateral primary osteoarthritis of hip 05/22/2019   Hormone replacement therapy (HRT) 11/22/2018   Chronic idiopathic constipation 05/23/2018   Diverticular disease 05/23/2018   Constipation 05/23/2018   Hypothyroidism following radioiodine therapy 05/20/2018   Plantar fascial fibromatosis 02/09/2018   Menopausal syndrome 10/31/2017   Obesity (BMI 30-39.9) 02/02/2017   Adjustment insomnia 02/02/2017   Allergic rhinitis due to pollen 02/02/2017   Multiple joint pain 02/02/2017   Vitamin D deficiency 02/02/2017   Lactose intolerance 02/02/2017   Essential hypertension 09/14/2016    Past Medical History:  Diagnosis Date   Allergy    Atypical chest pain 05/23/2018   Last Assessment & Plan:   Patient with consistent intermittent left-sided chest wall pain with   radiating to her left shoulder. Previously improved with Mobic. Today, EKG   and CXR completed. EKG: normal EKG, normal sinus rhythm. CXR with no acute   process, cardiomegaly, or fluid. Vitals WNL.   Need updated lipid panel. Will include with upcoming thyroid studies.     Bilateral primary osteoarthritis of hip 05/22/2019   Constipation    Dry skin dermatitis 02/02/2017   History of cholecystectomy 05/23/2018   Lumbar arthropathy    OA (osteoarthritis) of knee 10/31/2017   Last Assessment & Plan:   Will focus on weight loss. To [facility] for knee injections if needed.      Past Surgical History:  Procedure Laterality Date   BREAST CYST EXCISION     CHOLECYSTECTOMY     VAGINAL HYSTERECTOMY      Social History   Socioeconomic History   Marital status: Married    Spouse name: Not on file    Number of children: Not on file   Years of education: Not on file   Highest education level: Not on file  Occupational History    Employer: [employer]  Tobacco Use   Smoking status: Never   Smokeless tobacco: Never  Vaping Use   Vaping status: Never Used  Substance and Sexual Activity   Alcohol use: No   Drug use: No   Sexual activity: Not on file  Other Topics Concern   Not on file  Social History Narrative   Not on file   Social Drivers of Health   Financial Resource Strain: Not on file  Food Insecurity: Not on file  Transportation Needs: Not on file  Physical Activity: Not on file  Stress: Not on file  Social  Connections: Not on file  Intimate Partner Violence: Not on file    Family History  Problem Relation Age of Onset   Lymphoma Sister 29   Heart disease Mother    Heart attack Father    Cancer Maternal Grandmother    Thyroid disease Neg Hx      Review of Systems  Constitutional: Negative.  Negative for chills and fever.  HENT: Negative.  Negative for congestion and sore throat.   Respiratory: Negative.  Negative for cough and shortness of breath.   Cardiovascular: Negative.  Negative for chest pain and palpitations.  Gastrointestinal:  Negative for abdominal pain, diarrhea, nausea and vomiting.  Genitourinary: Negative.  Negative for dysuria and hematuria.  Musculoskeletal:  Positive for back pain and joint pain.  Skin: Negative.  Negative for rash.  Neurological: Negative.  Negative for dizziness and headaches.  All other systems reviewed and are negative.   Vitals:   04/16/23 1347  Pulse: 75  Temp: 97.7 F (36.5 C)  SpO2: 97%    Physical Exam Vitals reviewed.  Constitutional:      Appearance: Normal appearance.  HENT:     Head: Normocephalic.  Eyes:     Extraocular Movements: Extraocular movements intact.  Cardiovascular:     Rate and Rhythm: Normal rate and regular rhythm.     Pulses: Normal pulses.      Heart sounds: Normal heart sounds.  Pulmonary:     Effort: Pulmonary effort is normal.     Breath sounds: Normal breath sounds.  Musculoskeletal:     Comments: Right shoulder: Mild tenderness to outer palpation.  Full range of motion but complaining of severe pain Right upper extremity: Neurovascularly intact.  Skin:    General: Skin is warm and dry.  Neurological:     General: No focal deficit present.     Mental Status: She is alert and oriented to person, place, and time.  Psychiatric:        Mood and Affect: Mood normal.        Behavior: Behavior normal.      ASSESSMENT & PLAN: A total of 32 minutes was spent with the patient and counseling/coordination of care regarding preparing for this visit, review of most recent office visit notes, review of multiple chronic medical conditions under management, review of all medications, differential diagnosis of right shoulder pain and need for workup, review of x-rays images done today, pain management, need for orthopedic evaluation, prognosis, documentation and need for follow-up  Problem List Items Addressed This Visit       Other   Acute pain of right shoulder - Primary   Clinically stable.  Full range of motion during exam but complaining of severe pain. Differential diagnosis discussed. Will do x-ray today and review images when ready Pain management discussed Has been taking Tylenol and diclofenac without much relief Recommend Tylenol with codeine No. 3 as needed Needs orthopedic evaluation. Has appointment to see orthopedist in 2 days. Advised to also follow-up with PCP      Relevant Medications   acetaminophen-codeine (TYLENOL #3) 300-30 MG tablet   Other Relevant Orders   DG Shoulder Right   Patient Instructions  Shoulder Pain Many things can cause shoulder pain, including: An injury. Moving the shoulder in the same way again and again (overuse). Joint pain (arthritis). Pain can come from: Swelling and  irritation (inflammation) of any part of the shoulder. An injury to: The shoulder joint. Tissues that connect muscle to bone (tendons). Tissues that connect  bones to each other (ligaments). Bones. Follow these instructions at home: Watch for changes in your symptoms. Let your doctor know about them. Follow these instructions to help with your pain. If you have a sling that can be taken off: Wear the sling as told by your doctor. Take it off only as told by your doctor. Check the skin around the sling every day. Tell your doctor if you see problems. Loosen the sling if your fingers: Tingle. Become numb. Become cold. Keep the sling clean. If the sling is not waterproof: Do not let it get wet. Take the sling off when you shower or bathe. Managing pain, stiffness, and swelling  If told, put ice on the painful area. Put ice in a plastic bag. Place a towel between your skin and the bag. Leave the ice on for 20 minutes, 2-3 times a day. Stop putting ice on if it does not help with the pain. If your skin turns bright red, take off the ice right away to prevent skin damage. The risk of damage is higher if you cannot feel pain, heat, or cold. Squeeze a soft ball or a foam pad as much as possible. This prevents swelling in the shoulder. It also helps to strengthen the arm. General instructions Take over-the-counter and prescription medicines only as told by your doctor. Keep all follow-up visits. This will help you avoid any type of permanent shoulder problems. Contact a doctor if: Your pain gets worse. Medicine does not help your pain. You have new pain in your arm, hand, or fingers. You loosen your sling and your arm, hand, or fingers: Tingle. Are numb. Are swollen. Get help right away if: Your arm, hand, or fingers turn white or blue. This information is not intended to replace advice given to you by your health care provider. Make sure you discuss any questions you have with your  health care provider. Document Revised: 07/22/2021 Document Reviewed: 07/22/2021 Elsevier Patient Education  2024 Elsevier Inc.    Maryagnes Small, MD [facility]

## 2023-04-16 NOTE — Telephone Encounter (Signed)
 Noted, appt today.

## 2023-04-16 NOTE — Patient Instructions (Signed)
 Shoulder Pain  Many things can cause shoulder pain, including:  An injury.  Moving the shoulder in the same way again and again (overuse).  Joint pain (arthritis).  Pain can come from:  Swelling and irritation (inflammation) of any part of the shoulder.  An injury to:  The shoulder joint.  Tissues that connect muscle to bone (tendons).  Tissues that connect bones to each other (ligaments).  Bones.  Follow these instructions at home:  Watch for changes in your symptoms. Let your doctor know about them. Follow these instructions to help with your pain.  If you have a sling that can be taken off:  Wear the sling as told by your doctor. Take it off only as told by your doctor.  Check the skin around the sling every day. Tell your doctor if you see problems.  Loosen the sling if your fingers:  Tingle.  Become numb.  Become cold.  Keep the sling clean.  If the sling is not waterproof:  Do not let it get wet.  Take the sling off when you shower or bathe.  Managing pain, stiffness, and swelling    If told, put ice on the painful area.  Put ice in a plastic bag.  Place a towel between your skin and the bag.  Leave the ice on for 20 minutes, 2-3 times a day. Stop putting ice on if it does not help with the pain.  If your skin turns bright red, take off the ice right away to prevent skin damage. The risk of damage is higher if you cannot feel pain, heat, or cold.  Squeeze a soft ball or a foam pad as much as possible. This prevents swelling in the shoulder. It also helps to strengthen the arm.  General instructions  Take over-the-counter and prescription medicines only as told by your doctor.  Keep all follow-up visits. This will help you avoid any type of permanent shoulder problems.  Contact a doctor if:  Your pain gets worse.  Medicine does not help your pain.  You have new pain in your arm, hand, or fingers.  You loosen your sling and your arm, hand, or fingers:  Tingle.  Are numb.  Are swollen.  Get help right away  if:  Your arm, hand, or fingers turn white or blue.  This information is not intended to replace advice given to you by your health care provider. Make sure you discuss any questions you have with your health care provider.  Document Revised: 07/22/2021 Document Reviewed: 07/22/2021  Elsevier Patient Education  2024 [employer].

## 2023-04-16 NOTE — Telephone Encounter (Signed)
 Copied from CRM [id number]. Topic: Clinical - Red Word Triage >> Apr 16, 2023  8:16 AM Elle L wrote: [facility] that prompted transfer to Nurse Triage: The patient's upper right arm is in severe pain. She has been taking cyclobenzaprine (FLEXERIL) 5 MG tablet  for the pain but it is causing stomach pain. She originally declined Nurse Triage but agreed to speak to a Nurse once advised that Dr. Daneil Dunker did not have an appointment available until the 21st and she prefers to not see PA's and NP's.  Chief Complaint: right arm pain  Disposition: [] ED /[] Urgent Care (no appt availability in office) / [] Appointment(In office/virtual)/ []  [facility]/ [] Home Care/ [] Refused Recommended Disposition /[] [facility]/ []  Follow-up with PCP Additional Notes: Pt was not interested in triage. Pt sounded irritated when was going to triage her. Stopped the triage and gave her an appt today with a different location. Pt wanting MD only. Address to location given.   Answer Assessment - Initial Assessment Questions 2. LOCATION: "Where is the pain located?"     Right arm pain 3. PAIN: "How bad is the pain?" (Scale 1-10; or mild, moderate, severe)   - MILD (1-3): Doesn't interfere with normal activities.   - MODERATE (4-7): Interferes with normal activities (e.g., work or school) or awakens from sleep.   - SEVERE (8-10): Excruciating pain, unable to do any normal activities, unable to hold a cup of water.     severe  Protocols used: Arm Pain-A-AH

## 2023-04-17 ENCOUNTER — Encounter: Payer: Self-pay | Admitting: Emergency Medicine

## 2023-04-18 ENCOUNTER — Other Ambulatory Visit: Payer: Self-pay | Admitting: Family Medicine

## 2023-04-18 DIAGNOSIS — M25552 Pain in left hip: Secondary | ICD-10-CM | POA: Diagnosis not present

## 2023-04-24 ENCOUNTER — Other Ambulatory Visit: Payer: Self-pay | Admitting: Family Medicine

## 2023-04-26 ENCOUNTER — Other Ambulatory Visit: Payer: Self-pay | Admitting: *Deleted

## 2023-04-26 ENCOUNTER — Encounter: Payer: Self-pay | Admitting: Family Medicine

## 2023-04-26 MED ORDER — AZELASTINE HCL 0.1 % NA SOLN: 2.0000 | Freq: Two times a day (BID) | NASAL | 12 refills | Status: AC

## 2023-04-26 NOTE — Telephone Encounter (Signed)
 Prescription Request  04/26/2023  LOV: 03/12/2023  Patient states she is going out of town tomorrow morning and will be out of the following medications  What is the name of the medication or equipment? levothyroxine  (SYNTHROID ) 125 MCG tablet   requests RX be sent with refills  AND  azelastine  (ASTELIN ) 0.1 % nasal spray requests RX be sent with refills  Have you contacted your pharmacy to request a refill? Yes   Which pharmacy would you like this sent to?  [id number] - [place], [place] - [address] [place]tha [place] [address] Phone: [phone] Fax: [phone]    Patient notified that their request is being sent to the clinical staff for review and that they should receive a response within 2 business days.   Please advise at Mobile [phone] (mobile)

## 2023-04-26 NOTE — Telephone Encounter (Signed)
 Please see call note from patient and advise if ok to call in refill early for patient going out of town

## 2023-04-27 NOTE — Telephone Encounter (Signed)
 Ok to send in refill.  Jinny Mounts. Daneil Dunker, MD 04/27/2023 12:32 PM

## 2023-05-13 ENCOUNTER — Encounter: Payer: Self-pay | Admitting: Family Medicine

## 2023-05-14 ENCOUNTER — Encounter: Payer: Self-pay | Admitting: Family Medicine

## 2023-05-14 ENCOUNTER — Ambulatory Visit: Admitting: Family Medicine

## 2023-05-14 VITALS — BP 109/74 | HR 84 | Temp 97.3°F | Ht 64.0 in | Wt 210.8 lb

## 2023-05-14 DIAGNOSIS — R21 Rash and other nonspecific skin eruption: Secondary | ICD-10-CM

## 2023-05-14 DIAGNOSIS — E669 Obesity, unspecified: Secondary | ICD-10-CM

## 2023-05-14 DIAGNOSIS — I10 Essential (primary) hypertension: Secondary | ICD-10-CM | POA: Diagnosis not present

## 2023-05-14 MED ORDER — DOXYCYCLINE HYCLATE 100 MG PO TABS: 100.0000 mg | ORAL_TABLET | Freq: Two times a day (BID) | ORAL | 0 refills | Status: DC

## 2023-05-14 MED ORDER — TRIAMCINOLONE ACETONIDE 0.5 % EX OINT: 1.0000 | TOPICAL_OINTMENT | Freq: Two times a day (BID) | CUTANEOUS | 0 refills | Status: DC

## 2023-05-14 NOTE — Telephone Encounter (Signed)
Patient was seen today by PCP.

## 2023-05-14 NOTE — Assessment & Plan Note (Signed)
 She is working on diet and exercise.  On Mounjaro 15 mg weekly.  Tolerating well.  She is down about 3 pounds over the last month.

## 2023-05-14 NOTE — Assessment & Plan Note (Signed)
Blood pressure at goal today on losartan-HCTZ 100-25 once daily.

## 2023-05-14 NOTE — Patient Instructions (Addendum)
 It was very nice to see you today!  I am concerned that you may have a skin infection.  Start the doxycycline .  Use the triamcinolone  as needed.  Let us  know if not improving.  Return if symptoms worsen or fail to improve.   Take care, Dr Daneil Dunker  PLEASE NOTE:  If you had any lab tests, please let us  know if you have not heard back within a few days. You may see your results on mychart before we have a chance to review them but we will give you a call once they are reviewed by us .   If we ordered any referrals today, please let us  know if you have not heard from their office within the next week.   If you had any urgent prescriptions sent in today, please check with the pharmacy within an hour of our visit to make sure the prescription was transmitted appropriately.   Please try these tips to maintain a healthy lifestyle:  Eat at least 3 REAL meals and 1-2 snacks per day.  Aim for no more than 5 hours between eating.  If you eat breakfast, please do so within one hour of getting up.   Each meal should contain half fruits/vegetables, one quarter protein, and one quarter carbs (no bigger than a computer mouse)  Cut down on sweet beverages. This includes juice, soda, and sweet tea.   Drink at least 1 glass of water with each meal and aim for at least 8 glasses per day  Exercise at least 150 minutes every week.

## 2023-05-14 NOTE — Progress Notes (Signed)
   Diane Alexander is a 63 y.o. female who presents today for an office visit.  Assessment/Plan:  New/Acute Problems: Rash  Concern for potential cellulitis given her recent hot tub exposure.  Discussed with patient that her symptoms likely are not due to a side effect of Mounjaro as this typically would present as systemic symptoms.  It is also possible she may be having a local inflammatory reaction or allergic reaction as well.  We will start doxycycline  100 mg twice daily to cover for potential cellulitis.  Will also start topical triamcinolone .  She will let us  know if not improving in the next 1 to 2 weeks and would consider biopsy versus dermatology referral at that time.  Chronic Problems Addressed Today: Obesity (BMI 30-39.9) She is working on diet and exercise.  On Mounjaro 15 mg weekly.  Tolerating well.  She is down about 3 pounds over the last month.  Essential hypertension Blood pressure at goal today on losartan /HCTZ 100-25 once daily.     Subjective:  HPI:  See A/P for status of chronic conditions.  Patient is here today for rash.  This started 3 weeks ago.  Initially had location of her back.  This started after being in a hot tub while in [place].  This started to improve however the next couple of weeks developed a similar appearing rash on her abdomen and also in her leg.  She notes that both of these new areas of rash were at the site of her 2 most recent Mounjaro injections.  The area is very itchy.  No significant pain.  She has tried using Benadryl without much improvement.  No fevers or chills.  No recent illnesses.  No recent medication changes.  No obvious arthropod bites.  Rash does seem to be improving over the last day or so.      Objective:  Physical Exam: BP 109/74   Pulse 84   Temp (!) 97.3 F (36.3 C) (Temporal)   Ht 5\' 4"  (1.626 m)   Wt 210 lb 12.8 oz (95.6 kg)   LMP  (LMP Unknown)   SpO2 96%   BMI 36.18 kg/m   Wt Readings from Last 3 Encounters:   05/14/23 210 lb 12.8 oz (95.6 kg)  04/16/23 213 lb 4 oz (96.7 kg)  03/12/23 212 lb 12.8 oz (96.5 kg)    Gen: No acute distress, resting comfortably Skin: Raised erythematous patch approximately 2 cm in diameter on right thigh.  Please see below patient uploaded pictures. Neuro: Grossly normal, moves all extremities Psych: Normal affect and thought content           Breven Guidroz M. Daneil Dunker, MD 05/14/2023 11:51 AM

## 2023-05-18 ENCOUNTER — Telehealth: Payer: Self-pay | Admitting: *Deleted

## 2023-05-18 NOTE — Telephone Encounter (Signed)
 Copied from CRM [id number]. Topic: Clinical - Request for Lab/Test Order >> May 18, 2023  8:27 AM Martinique E wrote: Reason for CRM: Patient is requesting lab work orders to get put in. Stated she was prescribed a new medication (did not want to relay the name of medication to agent) and she wants PCP to put in orders for lab work, and hoping it could get done today. Callback number for patient is [phone].  Left message to return call to our office at their convenience.  Jaiya Mooradian,RMA

## 2023-05-22 ENCOUNTER — Encounter: Payer: Self-pay | Admitting: Family Medicine

## 2023-05-22 NOTE — Telephone Encounter (Signed)
 See note

## 2023-05-22 NOTE — Telephone Encounter (Signed)
 I appreciate the update.  At this point she can discontinue the Mounjaro and see how she does for a few weeks or we can switch to Ozempic  to see if she does better with this.

## 2023-05-23 ENCOUNTER — Encounter: Payer: Self-pay | Admitting: Family Medicine

## 2023-05-23 NOTE — Telephone Encounter (Signed)
 Please see my response to her note from yesterday.  Recommend she discontinue the Mounjaro.  We can switch her to Ozempic  if she prefers this or she can stay off of all medications for a few weeks.

## 2023-05-23 NOTE — Telephone Encounter (Signed)
**Note De-identified  [place] Obfuscation** Please advise 

## 2023-05-24 ENCOUNTER — Encounter: Payer: Self-pay | Admitting: Family Medicine

## 2023-05-24 ENCOUNTER — Telehealth: Payer: Self-pay | Admitting: *Deleted

## 2023-05-24 ENCOUNTER — Other Ambulatory Visit: Payer: Self-pay | Admitting: *Deleted

## 2023-05-24 ENCOUNTER — Telehealth: Payer: Self-pay

## 2023-05-24 ENCOUNTER — Ambulatory Visit: Admitting: Family Medicine

## 2023-05-24 ENCOUNTER — Ambulatory Visit: Payer: Self-pay

## 2023-05-24 VITALS — BP 111/76 | HR 87 | Temp 97.7°F | Ht 64.0 in | Wt 211.6 lb

## 2023-05-24 DIAGNOSIS — I10 Essential (primary) hypertension: Secondary | ICD-10-CM

## 2023-05-24 DIAGNOSIS — E1169 Type 2 diabetes mellitus with other specified complication: Secondary | ICD-10-CM | POA: Diagnosis not present

## 2023-05-24 MED ORDER — SEMAGLUTIDE (2 MG/DOSE) 8 MG/3ML ~~LOC~~ SOPN
2.0000 mg | PEN_INJECTOR | SUBCUTANEOUS | 1 refills | Status: DC
Start: 2023-05-24 — End: 2023-08-01

## 2023-05-24 MED ORDER — WEGOVY 2.4 MG/0.75ML ~~LOC~~ SOAJ: 2.4000 mg | SUBCUTANEOUS | 0 refills | Status: DC

## 2023-05-24 NOTE — Progress Notes (Signed)
   Diane Alexander is a 63 y.o. female who presents today for an office visit.  Assessment/Plan:  New/Acute Problems: Rash  She has now had 3 separate instances of similar.  Rash after her Mounjaro injection last few weeks.  At this point it appears that her rash is likely due to some type of allergic reaction to a component of her Mounjaro.  Recommended that she discontinue this completely.  It is okay for her to continue with Benadryl and topical triamcinolone .  She did well with Ozempic  until we switched a couple of months ago.  She is predominantly concerned about weight loss at this point.  Will switch her work to Wegovy .  She will follow-up with us  in a week or 2.  If symptoms do not improve with this would consider referral to dermatology or allergy.  Chronic Problems Addressed Today: Essential hypertension Blood pressure at goal today on losartan /HCTZ 100-25 once daily.  Type 2 diabetes mellitus with other specified complication (HCC) A1c was at goal.  Appears that she has developed an allergic reaction to Mounjaro as above.  We will switch back to semaglutide .  She did well with this previously.  Recheck A1c at next office visit.    Subjective:  HPI:  See A/P for status of chronic conditions.  Patient is here today for follow-up.  We last saw her 10 days ago.  At that point she had been having ongoing rash for a few weeks at the site of her Mounjaro injection sites.  We had recently switched her from Ozempic  to Mounjaro to see if this would help her more with weight loss.  At our last visit she had had a few different rashes on her leg and abdomen.  She also noted that she had recently been on vacation and been in a hot tub.  There was concern for potential cellulitis at that visit and was started on doxycycline  to cover this.  Also started topical triamcinolone  to cover local inflammatory reaction.  Those areas of rash did improve however upon injecting her Mounjaro a few days ago when her  left upper thigh she noticed recurrence of rash about 2 hours later.  Since then the rash has continued to enlarge and spread.  She has also had some diffuse itching as well.  No fevers or chills.  No other obvious exposures.  She has tried taking Benadryl with some improvement.       Objective:  Physical Exam: BP 111/76   Pulse 87   Temp 97.7 F (36.5 C) (Temporal)   Ht 5\' 4"  (1.626 m)   Wt 211 lb 9.6 oz (96 kg)   LMP  (LMP Unknown)   SpO2 100%   BMI 36.32 kg/m   Wt Readings from Last 3 Encounters:  05/24/23 211 lb 9.6 oz (96 kg)  05/14/23 210 lb 12.8 oz (95.6 kg)  04/16/23 213 lb 4 oz (96.7 kg)    Gen: No acute distress, resting comfortably CV: Regular rate and rhythm with no murmurs appreciated Pulm: Normal work of breathing, clear to auscultation bilaterally with no crackles, wheezes, or rhonchi Skin: Left upper thigh with large confluent erythematous area minimal induration Neuro: Grossly normal, moves all extremities Psych: Normal affect and thought content      Diane Koudelka M. Daneil Dunker, MD 05/24/2023 9:07 AM

## 2023-05-24 NOTE — Telephone Encounter (Signed)
 Okay to send in prescription for Ozempic  2 mg weekly.

## 2023-05-24 NOTE — Assessment & Plan Note (Signed)
Blood pressure at goal today on losartan-HCTZ 100-25 once daily.

## 2023-05-24 NOTE — Telephone Encounter (Signed)
 No triage: Pt asking for "card code" to help reduce price of Wegovy . Pt's portion of cost is $ 1,000. If no code, please have suggestions for alternative. Please advise.         Copied from CRM [id number]. Topic: Clinical - Medication Question >> May 24, 2023  9:31 AM Jenice Mitts wrote: Reason for CRM: Patient just came from an appointment and she has some questions about a new medication she was prescribed. She can be reached at her mobile number Reason for Disposition  Health Information question, no triage required and triager able to answer question  Answer Assessment - Initial Assessment Questions 1. REASON FOR CALL or QUESTION: "What is your reason for calling today?" or "How can I best help you?" or "What question do you have that I can help answer?"     Pt is requesting  "card code" to help with cost of Wegovy .  Protocols used: Information Only Call - No Triage-A-AH

## 2023-05-24 NOTE — Telephone Encounter (Signed)
 Pharmacy Patient Advocate Encounter   Received notification from CoverMyMeds that prior authorization for Ozempic  (2 MG/DOSE) 8MG /3ML pen-injectors is required/requested.   Insurance verification completed.   The patient is insured through [employer] .   Per test claim: PA required; PA started via CoverMyMeds. KEY [id number] . Waiting for clinical questions to populate.

## 2023-05-24 NOTE — Telephone Encounter (Signed)
 Rx send to pharmacy

## 2023-05-24 NOTE — Assessment & Plan Note (Signed)
 A1c was at goal.  Appears that she has developed an allergic reaction to Mounjaro as above.  We will switch back to semaglutide .  She did well with this previously.  Recheck A1c at next office visit.

## 2023-05-24 NOTE — Telephone Encounter (Signed)
Patient had an OV with PCP  

## 2023-05-24 NOTE — Telephone Encounter (Signed)
 See previews note

## 2023-05-24 NOTE — Patient Instructions (Signed)
 It was very nice to see you today!  Please stop the Mounjaro.  I believe the rash is coming from an allergic reaction due to this.  We will send in Wegovy .  It is okay for you to use Benadryl to help with the rash.  You can also use the triamcinolone .  Let me know if not improving.  No follow-ups on file.   Take care, Dr Daneil Dunker  PLEASE NOTE:  If you had any lab tests, please let us  know if you have not heard back within a few days. You may see your results on mychart before we have a chance to review them but we will give you a call once they are reviewed by us .   If we ordered any referrals today, please let us  know if you have not heard from their office within the next week.   If you had any urgent prescriptions sent in today, please check with the pharmacy within an hour of our visit to make sure the prescription was transmitted appropriately.   Please try these tips to maintain a healthy lifestyle:  Eat at least 3 REAL meals and 1-2 snacks per day.  Aim for no more than 5 hours between eating.  If you eat breakfast, please do so within one hour of getting up.   Each meal should contain half fruits/vegetables, one quarter protein, and one quarter carbs (no bigger than a computer mouse)  Cut down on sweet beverages. This includes juice, soda, and sweet tea.   Drink at least 1 glass of water with each meal and aim for at least 8 glasses per day  Exercise at least 150 minutes every week.

## 2023-05-24 NOTE — Telephone Encounter (Signed)
 Copied from CRM [id number]. Topic: Clinical - Prescription Issue >> May 24, 2023 11:30 AM Hamp Levine R wrote: Reason for CRM: Patient states the Semaglutide -Weight Management (WEGOVY ) 2.4 MG/0.75ML SOAJ is too expensive for her to afford. Would like to go back to Ozempic .  Patient can be reached at [phone]   Please advise  [facility]

## 2023-05-30 NOTE — Telephone Encounter (Signed)
Clinical questions submitted

## 2023-05-31 ENCOUNTER — Encounter: Payer: Self-pay | Admitting: Family Medicine

## 2023-05-31 NOTE — Telephone Encounter (Signed)
 Pharmacy Patient Advocate Encounter  Received notification from [employer] that Prior Authorization for OZEMPIC  2MG /0.75ML has been DENIED.  Full denial letter will be uploaded to the media tab. See denial reason below.    PA #/Case ID/Reference #: [id number]

## 2023-05-31 NOTE — Telephone Encounter (Signed)
 LVM OZEMPIC  2MG /0.75ML has been DENIED.

## 2023-06-01 NOTE — Telephone Encounter (Signed)
(  KeyBrendon Caller) - [id number] Ozempic  (2 MG/DOSE) 8MG Flora Humphreys pen-injectors status: PA RequestCreated: May 30th, 2025Sent: May 30th, 2025 Waiting for determination

## 2023-06-04 ENCOUNTER — Telehealth: Payer: Self-pay

## 2023-06-04 ENCOUNTER — Encounter: Payer: Self-pay | Admitting: Family Medicine

## 2023-06-04 NOTE — Telephone Encounter (Signed)
 Copied from CRM [id number]. Topic: Clinical - Medication Question >> Jun 04, 2023 12:01 PM Martinique E wrote: Reason for CRM: Patient called requesting a callback from [place]. Patient stated that [facility] pharmacy needs the "diabetic code" in order for patient's Ozempic  to get filled. Callback number for patient is [phone].  Duplicate message please see previous CRM

## 2023-06-04 NOTE — Telephone Encounter (Signed)
 Copied from CRM [id number]. Topic: Clinical - Medication Prior Auth >> Jun 04, 2023 11:58 AM Melissa C wrote: Reason for CRM: received phone call from [employer] at [employer]. Asked that the Prior Authorization for Ozempic  be redone with [facility] greater than 6.5 or insurance will deny it. If any questions, he can be reached at  [phone]  Please advise most recent A1C 5.1 two months ago; PA not approved

## 2023-06-04 NOTE — Telephone Encounter (Signed)
 Copied from CRM [id number]. Topic: Clinical - Medication Question >> Jun 04, 2023  2:45 PM Shereese L wrote: Reason for EAV:[id number] for CRM: Patient called requesting a callback from [place]. Patient stated that [facility] pharmacy needs the "diabetic code" in order for patient's Ozempic  to get filled. Callback number for patient is [phone].   Duplicate message please see previous CRM   Left message to return call to our office at their convenience.  Tajee Savant,RMA

## 2023-06-04 NOTE — Telephone Encounter (Signed)
 See CRM message

## 2023-06-04 NOTE — Telephone Encounter (Signed)
 Please see note from Dr Daneil Dunker regarding past A1C in lab notes; only controlled due to using prescribed medication. Please resubmit PA per PCP.

## 2023-06-04 NOTE — Telephone Encounter (Signed)
 Please resubmit her prior authorization.  She does have previous A1c's above 6.5 from a year or 2 ago however they were well-controlled recently within the last year due to being on the Ozempic  and Mounjaro.

## 2023-06-05 ENCOUNTER — Other Ambulatory Visit (HOSPITAL_COMMUNITY): Payer: Self-pay

## 2023-06-05 NOTE — Telephone Encounter (Signed)
 Pt called and want to let us  know that someone from insurance called her and told her that they will be closing this case if they don't receive any response by this afternoon. I inform her of msg below from PA team member;   "Odean Bend, CPhT to Diane Alexander, [place]   06/05/23  2:25 PM Good afternoon. I have sent the request over to the appeal team."  Pt voiced understanding.

## 2023-06-05 NOTE — Telephone Encounter (Signed)
 Copied from CRM [id number]. Topic: Clinical - Medication Prior Auth >> Jun 04, 2023  3:45 PM Elle L wrote: Reason for CRM: The patient states that she missed the call from Alverda Joe, RMA. However, she just wanted to make her aware that the appeal department for her insurance company is going to reach out to the office directly regarding her Ozempic  prior authorization for an appeal.  Spoke with pt this afternoon. See note below.

## 2023-06-05 NOTE — Telephone Encounter (Signed)
 noted

## 2023-06-06 ENCOUNTER — Telehealth: Payer: Self-pay | Admitting: *Deleted

## 2023-06-06 NOTE — Telephone Encounter (Signed)
 Copied from CRM [id number]. Topic: Clinical - Medication Prior Auth >> Jun 06, 2023 10:14 AM Lajean Pike wrote: Reason for CRM: Crystal [phone] called from [employer] and stated that she needs to speak with someone to complete an appeal review by end of today or they would need to make a decision on the case. She provided the case number: [id number].   See previews note  Horald Lyme

## 2023-06-06 NOTE — Telephone Encounter (Signed)
 Copied from CRM [id number]. Topic: General - Other >> Jun 05, 2023  2:11 PM Ameerah G wrote: Reason for CRM: Sherline Distel from [employer] is calling to  request results for A1c, fasting plasma glucose, two hour plasma glucose. Sherline Distel would like to know If the medication is being used with any other glp1 or gip receptor. This is regarding the appeal that was sent to the insurance.  Callback number [phone] Case ID [id number] Fax [phone]   I called back #[phone] Gave information needed for PA  Agent stated will have determination by tomorrow  Waiting for determination  [facility]

## 2023-06-07 ENCOUNTER — Telehealth: Payer: Self-pay | Admitting: Pharmacist

## 2023-06-07 ENCOUNTER — Other Ambulatory Visit (HOSPITAL_COMMUNITY): Payer: Self-pay

## 2023-06-07 NOTE — Telephone Encounter (Signed)
 The appeal for Ozempic  has been approved through 06/05/2024.  Pharmacy has already processed the script.

## 2023-07-22 DIAGNOSIS — U071 COVID-19: Secondary | ICD-10-CM | POA: Diagnosis not present

## 2023-07-22 DIAGNOSIS — J069 Acute upper respiratory infection, unspecified: Secondary | ICD-10-CM | POA: Diagnosis not present

## 2023-07-31 ENCOUNTER — Other Ambulatory Visit: Payer: Self-pay | Admitting: Family Medicine

## 2023-08-01 ENCOUNTER — Encounter: Payer: Self-pay | Admitting: Family Medicine

## 2023-08-09 ENCOUNTER — Other Ambulatory Visit: Payer: Self-pay | Admitting: Family Medicine

## 2023-08-09 DIAGNOSIS — I10 Essential (primary) hypertension: Secondary | ICD-10-CM

## 2023-09-04 ENCOUNTER — Encounter: Payer: Self-pay | Admitting: Sports Medicine

## 2023-09-12 ENCOUNTER — Ambulatory Visit: Admitting: Family Medicine

## 2023-09-12 ENCOUNTER — Ambulatory Visit: Payer: Self-pay

## 2023-09-12 NOTE — Telephone Encounter (Signed)
 Advised to go to ED if symptoms worsen or if she develops leg pain, chest pain or shortness of breath. FYI Only or Action Required?: FYI only for provider.  Patient was last seen in primary care on 05/24/2023 by Kennyth Worth HERO, MD.  Called Nurse Triage reporting Leg Swelling.  Symptoms began today.  Interventions attempted: Nothing.  Symptoms are: gradually worsening.  Triage Disposition: No disposition on file.  Patient/caregiver understands and will follow disposition?:  Reason for Disposition  [1] Thigh, calf, or ankle swelling AND [2] only 1 side  Answer Assessment - Initial Assessment Questions Patient denies higher acuity questions. ED precautions reviewed, pt verbalized understanding.   1. ONSET: When did the swelling start? (e.g., minutes, hours, days)     30 minutes ago  2. LOCATION: What part of the leg is swollen?  Are both legs swollen or just one leg?     Left leg  3. SEVERITY: How bad is the swelling? (e.g., localized; mild, moderate, severe)     Pt unable to assess, states is driving  4. REDNESS: Is there redness or signs of infection?      Pt unable to assess, states is driving  5. PAIN: Is the swelling painful to touch? If Yes, ask: How painful is it?   (Scale 1-10; mild, moderate or severe)     Denies  6. MEDICAL HISTORY: Do you have a history of blood clots (e.g., DVT), cancer, heart failure, kidney disease, or liver failure?     Denies  Protocols used: Leg Swelling and Edema-A-AH Copied from CRM #[id number]. Topic: Clinical - Red Word Triage >> Sep 12, 2023 12:15 PM Shereese L wrote: [facility] that prompted transfer to Nurse Triage: entire Left leg is swollen

## 2023-09-13 DIAGNOSIS — Z6834 Body mass index (BMI) 34.0-34.9, adult: Secondary | ICD-10-CM | POA: Diagnosis not present

## 2023-09-13 DIAGNOSIS — Z01419 Encounter for gynecological examination (general) (routine) without abnormal findings: Secondary | ICD-10-CM | POA: Diagnosis not present

## 2023-09-13 DIAGNOSIS — Z1231 Encounter for screening mammogram for malignant neoplasm of breast: Secondary | ICD-10-CM | POA: Diagnosis not present

## 2023-09-19 ENCOUNTER — Other Ambulatory Visit: Payer: Self-pay | Admitting: Obstetrics and Gynecology

## 2023-09-19 DIAGNOSIS — R928 Other abnormal and inconclusive findings on diagnostic imaging of breast: Secondary | ICD-10-CM

## 2023-09-24 DIAGNOSIS — M79602 Pain in left arm: Secondary | ICD-10-CM | POA: Diagnosis not present

## 2023-09-24 DIAGNOSIS — R079 Chest pain, unspecified: Secondary | ICD-10-CM | POA: Diagnosis not present

## 2023-09-24 DIAGNOSIS — R200 Anesthesia of skin: Secondary | ICD-10-CM | POA: Diagnosis not present

## 2023-09-25 DIAGNOSIS — Z13820 Encounter for screening for osteoporosis: Secondary | ICD-10-CM | POA: Diagnosis not present

## 2023-09-27 ENCOUNTER — Encounter: Payer: Self-pay | Admitting: Family Medicine

## 2023-09-27 ENCOUNTER — Ambulatory Visit: Admitting: Family Medicine

## 2023-09-27 VITALS — BP 116/79 | HR 75 | Temp 97.0°F | Ht 64.0 in | Wt 215.0 lb

## 2023-09-27 DIAGNOSIS — R0789 Other chest pain: Secondary | ICD-10-CM

## 2023-09-27 DIAGNOSIS — R202 Paresthesia of skin: Secondary | ICD-10-CM | POA: Diagnosis not present

## 2023-09-27 DIAGNOSIS — E669 Obesity, unspecified: Secondary | ICD-10-CM | POA: Diagnosis not present

## 2023-09-27 DIAGNOSIS — E1169 Type 2 diabetes mellitus with other specified complication: Secondary | ICD-10-CM | POA: Diagnosis not present

## 2023-09-27 DIAGNOSIS — I10 Essential (primary) hypertension: Secondary | ICD-10-CM

## 2023-09-27 DIAGNOSIS — Z7985 Long-term (current) use of injectable non-insulin antidiabetic drugs: Secondary | ICD-10-CM

## 2023-09-27 LAB — CBC
HCT: 39.3 % (ref 36.0–46.0)
Hemoglobin: 13.1 g/dL (ref 12.0–15.0)
MCHC: 33.2 g/dL (ref 30.0–36.0)
MCV: 86.1 fl (ref 78.0–100.0)
Platelets: 253 K/uL (ref 150.0–400.0)
RBC: 4.57 Mil/uL (ref 3.87–5.11)
RDW: 13.4 % (ref 11.5–15.5)
WBC: 4.5 K/uL (ref 4.0–10.5)

## 2023-09-27 LAB — COMPREHENSIVE METABOLIC PANEL WITH GFR
ALT: 27 U/L (ref 0–35)
AST: 24 U/L (ref 0–37)
Albumin: 3.9 g/dL (ref 3.5–5.2)
Alkaline Phosphatase: 49 U/L (ref 39–117)
BUN: 18 mg/dL (ref 6–23)
CO2: 32 meq/L (ref 19–32)
Calcium: 9.5 mg/dL (ref 8.4–10.5)
Chloride: 97 meq/L (ref 96–112)
Creatinine, Ser: 0.7 mg/dL (ref 0.40–1.20)
GFR: 91.46 mL/min (ref >60.00)
Glucose, Bld: 90 mg/dL (ref 70–99)
Potassium: 3.5 meq/L (ref 3.5–5.1)
Sodium: 137 meq/L (ref 135–145)
Total Bilirubin: 0.6 mg/dL (ref 0.2–1.2)
Total Protein: 7.6 g/dL (ref 6.0–8.3)

## 2023-09-27 LAB — VITAMIN B12: Vitamin B-12: 921 pg/mL — ABNORMAL HIGH (ref 211–911)

## 2023-09-27 LAB — HEMOGLOBIN A1C: Hgb A1c MFr Bld: 5.4 % (ref 4.6–6.5)

## 2023-09-27 LAB — TSH: TSH: 5.46 u[IU]/mL (ref 0.35–5.50)

## 2023-09-27 LAB — FOLATE: Folate: 12.5 ng/mL (ref >5.9)

## 2023-09-27 NOTE — Assessment & Plan Note (Signed)
 Still having significant issues with weight loss despite GLP agonist.  She will let us  know if she would like a referral to see [facility] as recommended by her OB/GYN.

## 2023-09-27 NOTE — Assessment & Plan Note (Signed)
 Check A1c with labs.  She was not able to tolerate Mounjaro  due to an allergic reaction.  She is currently on semaglutide  but has skipped her last couple of weeks.  She will be restarting this soon.

## 2023-09-27 NOTE — Patient Instructions (Signed)
 It was very nice to see you today!  VISIT SUMMARY: You visited us  today due to left arm pain and left facial numbness that occurred three days ago. We discussed your concerns about hypertension, diabetes, obesity, hip osteoarthritis, and low back pain. We have outlined a plan to address each of these issues.  YOUR PLAN: ESSENTIAL HYPERTENSION: Your blood pressure is elevated, which needs to be managed to prevent heart problems. -We will monitor your blood pressure regularly and may adjust your medications as needed.  NUMBNESS - We will refer to Dr. Raford with cardiology. - We also refer you to [facility] neurology  OBESITY WITH METABOLIC SYNDROME/INSULIN RESISTANCE: Your weight is contributing to metabolic syndrome and insulin resistance. -Restart Ozempic  to help manage your weight. -Increase your protein intake and stay hydrated. -Consider a referral to [facility] for comprehensive weight management.  OSTEOARTHRITIS OF HIP, PLANNED FOR HIP REPLACEMENT: You have osteoarthritis in your hip and are planning for hip replacement surgery. -Continue receiving hip injections every six months. -Plan for hip replacement surgery next year, pending health clearance.  LOW BACK PAIN WITH LUMBAR SPONDYLOSIS AND CYST: You have chronic low back pain related to lumbar spondylosis and a cyst, which may be causing numbness in your pinky toe. -Review orthopedic notes and imaging for further evaluation. -Consider a referral to an orthopedist for management of lumbar spondylosis.  Return if symptoms worsen or fail to improve.   Take care, Dr Kennyth  PLEASE NOTE:  If you had any lab tests, please let us  know if you have not heard back within a few days. You may see your results on mychart before we have a chance to review them but we will give you a call once they are reviewed by us .   If we ordered any referrals today, please let us  know if you have not heard from their office within the  next week.   If you had any urgent prescriptions sent in today, please check with the pharmacy within an hour of our visit to make sure the prescription was transmitted appropriately.   Please try these tips to maintain a healthy lifestyle:  Eat at least 3 REAL meals and 1-2 snacks per day.  Aim for no more than 5 hours between eating.  If you eat breakfast, please do so within one hour of getting up.   Each meal should contain half fruits/vegetables, one quarter protein, and one quarter carbs (no bigger than a computer mouse)  Cut down on sweet beverages. This includes juice, soda, and sweet tea.   Drink at least 1 glass of water with each meal and aim for at least 8 glasses per day  Exercise at least 150 minutes every week.

## 2023-09-27 NOTE — Assessment & Plan Note (Signed)
Blood pressure at goal today on losartan-HCTZ 100-25 once daily.

## 2023-09-27 NOTE — Progress Notes (Signed)
 Diane Alexander is a 63 y.o. female who presents today for an office visit.  Assessment/Plan:  New/Acute Problems: Left arm paresthesias Symptoms have resolved.  Does have previous history of carpal tunnel which may be contributing.  Her episode a couple of days ago may have been related to mild transient neuropathy however she does have a few risk factors and family history that warrant further cardiac workup.  EKG at urgent care was normal-do not need to repeat today.  Will refer her to cardiology for further evaluation.  Will also check labs today.  Low suspicion that this represents a significant neurologic issue especially given the transient nature however she is interested in neurologic evaluation as well.  Will place referral today.  We discussed reasons to return to care.  Chronic Problems Addressed Today: Type 2 diabetes mellitus with other specified complication (HCC) Check A1c with labs.  She was not able to tolerate Mounjaro  due to an allergic reaction.  She is currently on semaglutide  but has skipped her last couple of weeks.  She will be restarting this soon.  Obesity (BMI 30-39.9) Still having significant issues with weight loss despite GLP agonist.  She will let us  know if she would like a referral to see [facility] as recommended by her OB/GYN.  Essential hypertension Blood pressure at goal today on losartan /HCTZ 100-25 once daily.     Subjective:  HPI:  See assessment / plan for status of chronic conditions.  Patient is here today for urgent care follow-up.  She went to urgent care 3 days ago with left arm pain and left face numbness.  This occurred suddenly when she was cooking at home.  Upon arrival to urgent care of the arm pain resolved though still had tingling on the left side of her cheek.  Had EKG performed which was notable for normal sinus rhythm.  She was told that they cannot perform any further workup and she was sent home.  She was recommended  to have follow-up with PCP or go back to the emergency room if symptoms return.  Discussed the use of AI scribe software for clinical note transcription with the patient, who gave verbal consent to proceed.  History of Present Illness Diane Alexander is a 63 year old female who presents with left arm pain and left facial numbness.  Three days ago, she experienced sudden left arm pain and left facial numbness while cooking at home. The arm pain resolved upon arrival at urgent care, but she continued to have tingling on the left side of her cheek. An EKG performed at urgent care showed normal sinus rhythm.  The left arm pain felt like a blood pressure cuff squeezing tighter and tighter, lasting less than five minutes. She took four baby aspirins at the time. The numbness on the left side of her face also lasted about five minutes. No chest pain, although she mentioned feeling heaviness in her left breast, which she attributes to the way her mammogram was conducted. The heaviness did not feel like 'an elephant on her chest' and resolved quickly.  She experienced a brief recurrence of tingling in her left arm yesterday after lunch, which resolved quickly. She is concerned about her family history of heart disease, as her mother passed away from heart complications and her father had multiple strokes.  She is currently considering hip replacement surgery due to hip issues and receives injections every six months. She is also concerned about her weight, noting  difficulty losing weight despite a suppressed appetite and minimal food intake. She has previously used Ozempic  but stopped due to lack of efficacy and plans to restart it. She has a history of carpal tunnel surgery on both wrists and reports numbness in her pinky toe, which she attributes to a possible pinched nerve.         Objective:  Physical Exam: BP 116/79   Pulse 75   Temp (!) 97 F (36.1 C) (Temporal)   Ht 5' 4 (1.626 m)   Wt 215 lb  (97.5 kg)   LMP  (LMP Unknown)   SpO2 100%   BMI 36.90 kg/m   Gen: No acute distress, resting comfortably CV: Regular rate and rhythm with no murmurs appreciated Pulm: Normal work of breathing, clear to auscultation bilaterally with no crackles, wheezes, or rhonchi Neuro: Grossly normal, moves all extremities Psych: Normal affect and thought content  Time Spent: 45 minutes of total time was spent on the date of the encounter performing the following actions: chart review prior to seeing the patient including recent Emergency Department visit, obtaining history, performing a medically necessary exam, counseling on the treatment plan, placing orders, and documenting in our EHR.        Diane HERO. Kennyth, MD 09/27/2023 12:49 PM

## 2023-09-28 ENCOUNTER — Ambulatory Visit
Admission: RE | Admit: 2023-09-28 | Discharge: 2023-09-28 | Disposition: A | Discharge status: Home / Self Care | Source: Ambulatory Visit | Attending: Obstetrics and Gynecology | Admitting: Obstetrics and Gynecology

## 2023-09-28 ENCOUNTER — Ambulatory Visit: Payer: Self-pay | Admitting: Family Medicine

## 2023-09-28 ENCOUNTER — Ambulatory Visit

## 2023-09-28 ENCOUNTER — Encounter: Payer: Self-pay | Admitting: Neurology

## 2023-09-28 DIAGNOSIS — R928 Other abnormal and inconclusive findings on diagnostic imaging of breast: Secondary | ICD-10-CM

## 2023-09-28 DIAGNOSIS — N6002 Solitary cyst of left breast: Secondary | ICD-10-CM | POA: Diagnosis not present

## 2023-09-28 NOTE — Progress Notes (Signed)
 Her labs are all at goal.  Do not need to make any changes to her treatment plan at this time.  She should follow-up with cardiology and neurology as we discussed at her office visit.

## 2023-10-06 ENCOUNTER — Encounter: Payer: Self-pay | Admitting: Family Medicine

## 2023-10-06 ENCOUNTER — Other Ambulatory Visit: Payer: Self-pay | Admitting: Family Medicine

## 2023-10-10 DIAGNOSIS — M25552 Pain in left hip: Secondary | ICD-10-CM | POA: Diagnosis not present

## 2023-11-07 ENCOUNTER — Other Ambulatory Visit: Payer: Self-pay | Admitting: Family Medicine

## 2023-12-08 ENCOUNTER — Other Ambulatory Visit: Payer: Self-pay | Admitting: Family Medicine

## 2023-12-20 DIAGNOSIS — M25511 Pain in right shoulder: Secondary | ICD-10-CM | POA: Diagnosis not present

## 2024-01-01 ENCOUNTER — Encounter: Payer: Self-pay | Admitting: Family Medicine

## 2024-01-01 ENCOUNTER — Other Ambulatory Visit: Payer: Self-pay | Admitting: Family Medicine

## 2024-01-01 MED ORDER — POTASSIUM CHLORIDE CRYS ER 20 MEQ PO TBCR: 20.0000 meq | EXTENDED_RELEASE_TABLET | Freq: Every day | ORAL | 1 refills | Status: AC

## 2024-01-01 MED ORDER — LINACLOTIDE 72 MCG PO CAPS: 72.0000 ug | ORAL_CAPSULE | Freq: Every day | ORAL | 0 refills | Status: AC

## 2024-01-02 ENCOUNTER — Other Ambulatory Visit: Payer: Self-pay | Admitting: Family Medicine

## 2024-01-07 ENCOUNTER — Encounter: Payer: Self-pay | Admitting: Neurology

## 2024-01-07 ENCOUNTER — Ambulatory Visit: Admitting: Neurology

## 2024-01-07 VITALS — BP 131/86 | HR 75 | Ht 64.0 in | Wt 214.0 lb

## 2024-01-07 DIAGNOSIS — R202 Paresthesia of skin: Secondary | ICD-10-CM

## 2024-01-07 NOTE — Progress Notes (Signed)
 [facility] Clinic Note - Initial Visit   Date: 01/07/2024   Diane Alexander MRN: [id number] DOB: 11-06-1960   Dear Dr. Kennyth:   Thank you for your kind referral of Diane Alexander for consultation of paresthesias. Although her history is well known to you, please allow us  to reiterate it for the purpose of our medical record. The patient was accompanied to the clinic by self.    Diane Alexander is a 64 y.o. right-handed female with hypothyroidism, hypertension, and OA presenting for evaluation of paresthesia.   IMPRESSION/PLAN: Assessment & Plan Intermittent paresthesia and numbness of extremities Symptoms suggest nerve compression or positional factors, not neuropathy.  Neurological exam is normal.  Vitamin B12 is normal.  - NCS/EMG was discussed and she opted to decline testing given that her neurological exam is reassuring. - Advised follow-up with orthopedics for shoulder pain and possible rotator cuff issues.  ------------------------------------------------------------- History of present illness:  Discussed the use of AI scribe software for clinical note transcription with the patient, who gave verbal consent to proceed.  History of Present Illness She experiences intermittent numbness and tingling in her feet and hands, describing the sensation as unusual. These symptoms have been present since before contracting COVID-19 in 2021, with exacerbations noted during each COVID-19 infection. The episodes occur a couple of times a month, lasting less than ten minutes, and she attempts to alleviate them by shaking her feet or repositioning.  She has chronic inflammation in her hips since contracting COVID-19, which significantly impacts her ability to walk due to pain. Despite using diclofenac  in both gel and pill form, the inflammation persists. She has undergone physical therapy and received injections for her hips without significant relief.  She reports  severe pain in her right shoulder, which began two weeks ago, making it difficult to raise. The pain may originate from her shoulder or arm, and she has tried heat, ice, and diclofenac  gel without relief. She has not consulted an orthopedic physician assistant for this issue.  She denies weakness and is not dropping things or falling, but she notes occasional balance issues. 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    Out-side paper records, electronic medical record, and images have been reviewed where available and summarized as:  MRI lumbar spine 09/05/2020: 1. Mild central and bilateral foraminal narrowing at L4-5 secondary to a broad-based disc protrusion and moderate facet hypertrophy. 2. Moderate facet hypertrophy and mild disc bulging at L3-4 and L5-S1 without significant stenosis at these levels.     Lab Results  Component Value Date   HGBA1C 5.4 09/27/2023   Lab Results  Component Value Date   VITAMINB12 921 (H) 09/27/2023   Lab Results  Component Value Date   TSH 5.46 09/27/2023   Lab Results  Component Value Date   ESRSEDRATE 22 03/19/2020    Past Medical History:  Diagnosis Date   Allergy    Atypical chest pain 05/23/2018   Last Assessment & Plan:   Patient with consistent intermittent left-sided chest wall pain with   radiating to her left shoulder. Previously improved with Mobic . Today, EKG   and CXR completed. EKG: normal EKG, normal sinus rhythm. CXR with no acute   process, cardiomegaly, or fluid. Vitals WNL.   Need updated lipid panel. Will include with upcoming thyroid  studies.     Bilateral primary osteoarthritis of hip 05/22/2019   Constipation  Dry skin dermatitis 02/02/2017   History of cholecystectomy 05/23/2018   Lumbar arthropathy    OA (osteoarthritis) of knee 10/31/2017   Last Assessment & Plan:   Will focus on weight loss. To [facility] for knee injections if needed.       Past Surgical History:  Procedure Laterality Date   BREAST CYST EXCISION     CHOLECYSTECTOMY     VAGINAL HYSTERECTOMY       Medications:  Outpatient Encounter Medications as of 01/07/2024  Medication Sig   acetaminophen -codeine  (TYLENOL  #3) 300-30 MG tablet Take 1 tablet by mouth every 6 (six) hours as needed for moderate pain (pain score 4-6).   aspirin 81 MG chewable tablet Chew 243 mg by mouth once as needed (for chest discomfort). (Patient taking differently: Chew 81 mg by mouth once as needed (for chest discomfort).)   azelastine  (ASTELIN ) 0.1 % nasal spray Place 2 sprays into both nostrils 2 (two) times daily.   calcium carbonate (TUMS - DOSED IN MG ELEMENTAL CALCIUM) 500 MG chewable tablet Chew 1 tablet by mouth as needed.   Cholecalciferol (VITAMIN D3) 50 MCG (2000 UT) TABS Take 2,000 Units by mouth daily.   cyclobenzaprine  (FLEXERIL ) 5 MG tablet TAKE ONE TABLET BY MOUTH THREE TIMES DAILY AS NEEDED FOR MUSCLE SPASMS   diclofenac  (VOLTAREN ) 75 MG EC tablet Take 1 tablet by mouth twice daily   diclofenac  sodium (VOLTAREN ) 1 % GEL Apply 2 g topically 4 (four) times daily.   estradiol  (ESTRACE ) 2 MG tablet Take one 2 mg tablet by mouth daily   furosemide  (LASIX ) 20 MG tablet Take 1 tablet (20 mg total) by mouth daily as needed for edema.   levothyroxine  (SYNTHROID ) 125 MCG tablet Take 1 tablet (125 mcg total) by mouth daily. (Patient taking differently: Take 125 mcg by mouth daily. 125 mcg 5 days a week and 112 mcg 2 days a week.)   linaclotide  (LINZESS ) 72 MCG capsule Take 1 capsule (72 mcg total) by mouth daily before breakfast.   losartan -hydrochlorothiazide  (HYZAAR) 100-25 MG tablet TAKE ONE TABLET BY MOUTH ONCE A DAY   OZEMPIC , 2 MG/DOSE, 8 MG/3ML SOPN INJECT 2MG  INTO THE SKIN ONCE WEEKLY   polyethylene glycol powder (GLYCOLAX/MIRALAX) 17 GM/SCOOP powder Take 17 g by mouth daily as needed for mild constipation.   potassium chloride  SA (KLOR-CON  M) 20 MEQ tablet Take 1 tablet  (20 mEq total) by mouth daily.   pyridOXINE (VITAMIN B6) 25 MG tablet Take 25 mg by mouth daily.   Semaglutide -Weight Management (WEGOVY ) 2.4 MG/0.75ML SOAJ Inject 2.4 mg into the skin once a week.   sodium chloride  (OCEAN) 0.65 % SOLN nasal spray Place 1 spray into both nostrils as needed for congestion.   vitamin B-12 (CYANOCOBALAMIN ) 50 MCG tablet Take 50 mcg by mouth daily.   ZYRTEC ALLERGY 10 MG tablet Take 10 mg by mouth at bedtime.   triamcinolone  ointment (KENALOG ) 0.5 % Apply 1 Application topically 2 (two) times daily. (Patient not taking: Reported on 01/07/2024)   [DISCONTINUED] carbamazepine  (TEGRETOL -XR) 100 MG 12 hr tablet Take 1 tablet (100 mg total) by mouth 2 (two) times daily. (Patient not taking: Reported on 01/07/2024)   [DISCONTINUED] celecoxib (CELEBREX) 200 MG capsule  (Patient not taking: Reported on 01/07/2024)   [DISCONTINUED] LORazepam  (ATIVAN ) 0.5 MG tablet 1-2 tabs 30 - 60 min prior to MRI. Do not drive with this medicine. (Patient not taking: Reported on 01/07/2024)   No facility-administered encounter medications on file as of 01/07/2024.    Allergies:  Allergies[1]  Family History: Family History  Problem Relation Age of Onset   Lymphoma Sister 15   Heart disease Mother    Heart attack Father    Cancer Maternal Grandmother    Thyroid  disease Neg Hx     Social History: Social History[2] Social History   Social History Narrative   Are you right handed or left handed? Right Handed   Are you currently employed ? Yes   What is your current occupation? [employer]    Do you live at home alone? No    Who lives with you? Spouse    What type of home do you live in: 1 story or 2 story? Lives in a two story home        Vital Signs:  BP 131/86   Pulse 75   Ht 5' 4 (1.626 m)   Wt 214 lb (97.1 kg)   LMP  (LMP Unknown)   SpO2 96%   BMI 36.73 kg/m    Neurological Exam: MENTAL STATUS including orientation to time, place, person, recent and  remote memory, attention span and concentration, language, and fund of knowledge is normal.  Speech is not dysarthric.  CRANIAL NERVES: II:  No visual field defects.     III-IV-VI: Pupils equal round and reactive to light.  Normal conjugate, extra-ocular eye movements in all directions of gaze.  No nystagmus.  No ptosis.   V:  Normal facial sensation.    VII:  Normal facial symmetry and movements.   VIII:  Normal hearing and vestibular function.   IX-X:  Normal palatal movement.   XI:  Normal shoulder shrug and head rotation.   XII:  Normal tongue strength and range of motion, no deviation or fasciculation.  MOTOR:  No atrophy, fasciculations or abnormal movements.  No pronator drift.   Upper Extremity:  Right  Left  Deltoid  5/5   5/5   Biceps  5/5   5/5   Triceps  5/5   5/5   Wrist extensors  5/5   5/5   Wrist flexors  5/5   5/5   Finger extensors  5/5   5/5   Finger flexors  5/5   5/5   Dorsal interossei  5/5   5/5   Abductor pollicis  5/5   5/5   Tone (Ashworth scale)  0  0   Lower Extremity:  Right  Left  Hip flexors  5/5   5/5   Knee flexors  5/5   5/5   Knee extensors  5/5   5/5   Dorsiflexors  5/5   5/5   Plantarflexors  5/5   5/5   Toe extensors  5/5   5/5   Toe flexors  5/5   5/5   Tone (Ashworth scale)  0  0   MSRs:                                           Right        Left brachioradialis 2+  2+  biceps 2+  2+  triceps 2+  2+  patellar 2+  2+  ankle jerk 2+  2+  Hoffman no  no  plantar response down  down   SENSORY:  Vibration is slightly asymmetric at the left great toe, as compared to the right but perception is present.  Temperature and pin  prick intact throughout.  Romberg's sign absent.   COORDINATION/GAIT: Normal finger-to- nose-finger.  Intact rapid alternating movements bilaterally.  Able to rise from a chair without using arms.  Gait narrow based and stable. Tandem and stressed gait intact.    Thank you for allowing me to participate in  patient's care.  If I can answer any additional questions, I would be pleased to do so.    Sincerely,    Marzella Miracle K. Magdaline Zollars, DO     [1] No Known Allergies [2]  Social History Tobacco Use   Smoking status: Never   Smokeless tobacco: Never  Vaping Use   Vaping status: Never Used  Substance Use Topics   Alcohol use: No   Drug use: No   "

## 2024-01-09 ENCOUNTER — Other Ambulatory Visit (HOSPITAL_COMMUNITY): Payer: Self-pay

## 2024-01-09 ENCOUNTER — Telehealth: Payer: Self-pay

## 2024-01-09 NOTE — Telephone Encounter (Signed)
 Pharmacy Patient Advocate Encounter   Received notification from Onbase CMM KEY that prior authorization for Ozempic  (2 MG/DOSE) 8MG /3ML pen-injectors is required/requested.   Insurance verification completed.   The patient is insured through [facility].   Per test claim: PA required; PA submitted to above mentioned insurance via Latent Key/confirmation #/EOC [id number] Status is pending

## 2024-01-09 NOTE — Telephone Encounter (Signed)
 Pharmacy Patient Advocate Encounter  Received notification from [facility] that Prior Authorization for Ozempic  (2 MG/DOSE) 8MG /3ML pen-injectors  has been APPROVED from 01/09/24 to 01/09/27. Ran test claim, Copay is $55.00. This test claim was processed through [facility]- copay amounts may vary at other pharmacies due to pharmacy/plan contracts, or as the patient moves through the different stages of their insurance plan.   PA #/Case ID/Reference #: [id number]

## 2024-01-09 NOTE — Telephone Encounter (Signed)
 LVM Ozempic  (2 MG/DOSE) 8MG /3ML pen-injectors  has been APPROVED from 01/09/24 to 01/09/27.

## 2024-01-10 ENCOUNTER — Encounter: Payer: Self-pay | Admitting: Family Medicine

## 2024-01-10 NOTE — Telephone Encounter (Signed)
 Spoke with patient, stated insurance was change  Will send new insurance copy via mychart

## 2024-01-17 ENCOUNTER — Ambulatory Visit: Admitting: Family Medicine

## 2024-01-23 ENCOUNTER — Encounter: Payer: Self-pay | Admitting: Family Medicine

## 2024-01-23 ENCOUNTER — Other Ambulatory Visit: Payer: Self-pay | Admitting: *Deleted

## 2024-01-23 ENCOUNTER — Ambulatory Visit: Payer: Self-pay

## 2024-01-23 NOTE — Telephone Encounter (Signed)
 LVM to schedule with Kennyth

## 2024-01-23 NOTE — Telephone Encounter (Signed)
 FYI Only or Action Required?: Action required by provider: request for appointment, clinical question for provider, and lab or test result follow-up needed.  Patient was last seen in primary care on 09/27/2023 by Diane Worth HERO, MD.  Called Nurse Triage reporting Fatigue.  Symptoms began several days ago.  Interventions attempted: Nothing.  Symptoms are: unchanged.  Triage Disposition: See Physician Within 24 Hours  Patient/caregiver understands and will follow disposition?: No, wishes to speak with PCP   Message from [name] sent at 01/23/2024  9:31 AM EST  Summary: Increased fatigue   Reason for Triage: Pt calling, reports she feels really tired and fatigued, feels like something is off.  Pt requesting an appt for evaluation, and to check glucose/a!c labs.         Reason for Disposition  [1] MODERATE weakness (e.g., interferes with work, school, normal activities) AND [2] persists > 3 days  Answer Assessment - Initial Assessment Questions Patient requesting call back from [facility]. Patient requesting orders for labs and appt on Thursday, with a MD at [facility];  only appts available with NP/PA.  Advised call back or ED/911 if symptoms occur/worsen: severe diff breathing, chest pain > 5 min, faint. Patient verbalized understanding.   1. DESCRIPTION: Describe how you are feeling.     I'm just tired 2. SEVERITY: How bad is it?  Can you stand and walk?     No problems, feels sluggish 3. ONSET: When did these symptoms begin? (e.g., hours, days, weeks, months)   weekend 4. CAUSE: What do you think is causing the weakness or fatigue? (e.g., not drinking enough fluids, medical problem, trouble sleeping)     no 5. NEW MEDICINES:  Have you started on any new medicines recently? (e.g., opioid pain medicines, benzodiazepines, muscle relaxants, antidepressants, antihistamines, neuroleptics, beta blockers)     No; right frozen shoulder dx; med caused welps; currently  not taking  Patient takes BP meds 6. OTHER SYMPTOMS: Do you have any other symptoms? (e.g., chest pain, fever, cough, SOB, vomiting, diarrhea, bleeding, other areas of pain)   Denies diff breath, faint, dizziness, chest pain, fever, chills n/v/d  Protocols used: Weakness (Generalized) and Fatigue-A-AH

## 2024-01-23 NOTE — Telephone Encounter (Signed)
 Spoke with patient, appointment schedule on 01/25/2024 wit hPCP

## 2024-01-23 NOTE — Telephone Encounter (Signed)
Please schedule an appointment with Dr Parker   

## 2024-01-25 ENCOUNTER — Ambulatory Visit: Admitting: Family Medicine

## 2024-01-25 ENCOUNTER — Encounter: Payer: Self-pay | Admitting: Family Medicine

## 2024-01-25 VITALS — BP 120/70 | HR 70 | Temp 97.3°F | Ht 64.0 in | Wt 216.8 lb

## 2024-01-25 DIAGNOSIS — Z7985 Long-term (current) use of injectable non-insulin antidiabetic drugs: Secondary | ICD-10-CM | POA: Diagnosis not present

## 2024-01-25 DIAGNOSIS — I10 Essential (primary) hypertension: Secondary | ICD-10-CM

## 2024-01-25 DIAGNOSIS — M2550 Pain in unspecified joint: Secondary | ICD-10-CM

## 2024-01-25 DIAGNOSIS — E1169 Type 2 diabetes mellitus with other specified complication: Secondary | ICD-10-CM

## 2024-01-25 DIAGNOSIS — E559 Vitamin D deficiency, unspecified: Secondary | ICD-10-CM | POA: Diagnosis not present

## 2024-01-25 DIAGNOSIS — R202 Paresthesia of skin: Secondary | ICD-10-CM

## 2024-01-25 DIAGNOSIS — R5383 Other fatigue: Secondary | ICD-10-CM | POA: Diagnosis not present

## 2024-01-25 DIAGNOSIS — E890 Postprocedural hypothyroidism: Secondary | ICD-10-CM

## 2024-01-25 LAB — COMPREHENSIVE METABOLIC PANEL WITH GFR
ALT: 21 U/L (ref 3–35)
AST: 21 U/L (ref 5–37)
Albumin: 3.9 g/dL (ref 3.5–5.2)
Alkaline Phosphatase: 56 U/L (ref 39–117)
BUN: 8 mg/dL (ref 6–23)
CO2: 33 meq/L — ABNORMAL HIGH (ref 19–32)
Calcium: 9.3 mg/dL (ref 8.4–10.5)
Chloride: 100 meq/L (ref 96–112)
Creatinine, Ser: 0.67 mg/dL (ref 0.40–1.20)
GFR: 92.21 mL/min
Glucose, Bld: 89 mg/dL (ref 70–99)
Potassium: 3.5 meq/L (ref 3.5–5.1)
Sodium: 138 meq/L (ref 135–145)
Total Bilirubin: 0.6 mg/dL (ref 0.2–1.2)
Total Protein: 7.5 g/dL (ref 6.0–8.3)

## 2024-01-25 LAB — CBC
HCT: 39.7 % (ref 36.0–46.0)
Hemoglobin: 13 g/dL (ref 12.0–15.0)
MCHC: 32.9 g/dL (ref 30.0–36.0)
MCV: 87.7 fl (ref 78.0–100.0)
Platelets: 276 K/uL (ref 150.0–400.0)
RBC: 4.53 Mil/uL (ref 3.87–5.11)
RDW: 13.9 % (ref 11.5–15.5)
WBC: 4.6 K/uL (ref 4.0–10.5)

## 2024-01-25 LAB — VITAMIN D 25 HYDROXY (VIT D DEFICIENCY, FRACTURES): VITD: 35.66 ng/mL (ref 30.00–100.00)

## 2024-01-25 LAB — VITAMIN B12: Vitamin B-12: 676 pg/mL (ref 211–911)

## 2024-01-25 LAB — HEMOGLOBIN A1C: Hgb A1c MFr Bld: 5.2 % (ref 4.6–6.5)

## 2024-01-25 LAB — FOLATE: Folate: 20.8 ng/mL

## 2024-01-25 LAB — T4, FREE: Free T4: 1.17 ng/dL (ref 0.60–1.60)

## 2024-01-25 LAB — TSH: TSH: 6.1 u[IU]/mL — ABNORMAL HIGH (ref 0.35–5.50)

## 2024-01-25 LAB — T3, FREE: T3, Free: 3.1 pg/mL (ref 2.3–4.2)

## 2024-01-25 NOTE — Assessment & Plan Note (Signed)
 Blood pressure at goal today on losartan /hydrochlorothiazide  100-25 once daily.

## 2024-01-25 NOTE — Assessment & Plan Note (Signed)
Check vitamin D with labs.

## 2024-01-25 NOTE — Assessment & Plan Note (Signed)
 Working with orthopedics for present shoulder.  Starting physical therapy next week.  Cannot tolerate prednisone  due to developing urticaria.  Discussed with patient she should follow-up with them soon to discuss next steps in management including advanced imaging if needed.

## 2024-01-25 NOTE — Progress Notes (Signed)
" ° °  Diane Alexander is a 64 y.o. female who presents today for an office visit.  Assessment/Plan:  New/Acute Problems: Other Fatigue Patient with several days of persistent fatigue though this does seem to be improving.  She has some associated weight gain and cold intolerance as well.  She is dealing with some orthopedic issues including frozen shoulder and is not getting adequate sleep at night which could be contributing however we will check labs today to rule out other potential causes.  Check CBC, c-Met, TSH, free T4, free T3, B12, folate, and vitamin D .  Chronic Problems Addressed Today: Essential hypertension Blood pressure at goal today on losartan /hydrochlorothiazide  100-25 once daily.   Type 2 diabetes mellitus with other specified complication (HCC) Check A1c with labs.  Previously was not able to tolerate Mounjaro .  She is on Ozempic  2 mg weekly.  Tolerating well.  Vitamin D  deficiency Check vitamin D  with labs.  Hypothyroidism following radioiodine therapy She is on Synthroid  per endocrinology.  Check TSH, free T4, and free T3 today.  Multiple joint pain Working with orthopedics for present shoulder.  Starting physical therapy next week.  Cannot tolerate prednisone  due to developing urticaria.  Discussed with patient she should follow-up with them soon to discuss next steps in management including advanced imaging if needed.     Subjective:  HPI:  See assessment / plan for status of chronic conditions.  Patient here today with persistent fatigue.  This has been going on for the last week or so.  She has also been dealing with chronic bilateral shoulder pain right worse than left and has been following with orthopedics for this.  She was recently on prednisone  for a few days however had to discontinue due to developing urticaria on her back from this.  They diagnosed her with frozen shoulder and have referred her to physical therapy which will be starting in about a week or so.   She still has persistent pain which has made it difficult for her to sleep.  She is also having some additional symptoms including difficulty with losing weight and cold intolerance.  This is also been going on for quite a while.  She would like to have labs done today.        Objective:  Physical Exam: BP 120/70   Pulse 70   Temp (!) 97.3 F (36.3 C) (Temporal)   Ht 5' 4 (1.626 m)   Wt 216 lb 12.8 oz (98.3 kg)   LMP  (LMP Unknown)   SpO2 98%   BMI 37.21 kg/m   Wt Readings from Last 3 Encounters:  01/25/24 216 lb 12.8 oz (98.3 kg)  01/07/24 214 lb (97.1 kg)  09/27/23 215 lb (97.5 kg)    Gen: No acute distress, resting comfortably CV: Regular rate and rhythm with no murmurs appreciated Pulm: Normal work of breathing, clear to auscultation bilaterally with no crackles, wheezes, or rhonchi Neuro: Grossly normal, moves all extremities Psych: Normal affect and thought content      Atiyana Welte M. Kennyth, MD 01/25/2024 9:16 AM  "

## 2024-01-25 NOTE — Assessment & Plan Note (Signed)
 Check A1c with labs.  Previously was not able to tolerate Mounjaro .  She is on Ozempic  2 mg weekly.  Tolerating well.

## 2024-01-25 NOTE — Patient Instructions (Addendum)
 It was very nice to see you today!  Will check blood work today to see if there are any causes for your fatigue.  Return if symptoms worsen or fail to improve.   Take care, Dr Kennyth  PLEASE NOTE:  If you had any lab tests, please let us  know if you have not heard back within a few days. You may see your results on mychart before we have a chance to review them but we will give you a call once they are reviewed by us .   If we ordered any referrals today, please let us  know if you have not heard from their office within the next week.   If you had any urgent prescriptions sent in today, please check with the pharmacy within an hour of our visit to make sure the prescription was transmitted appropriately.   Please try these tips to maintain a healthy lifestyle:  Eat at least 3 REAL meals and 1-2 snacks per day.  Aim for no more than 5 hours between eating.  If you eat breakfast, please do so within one hour of getting up.   Each meal should contain half fruits/vegetables, one quarter protein, and one quarter carbs (no bigger than a computer mouse)  Cut down on sweet beverages. This includes juice, soda, and sweet tea.   Drink at least 1 glass of water with each meal and aim for at least 8 glasses per day  Exercise at least 150 minutes every week.

## 2024-01-25 NOTE — Assessment & Plan Note (Signed)
 She is on Synthroid  per endocrinology.  Check TSH, free T4, and free T3 today.

## 2024-01-29 ENCOUNTER — Ambulatory Visit: Payer: Self-pay | Admitting: Family Medicine

## 2024-01-29 ENCOUNTER — Telehealth: Payer: Self-pay

## 2024-01-29 NOTE — Progress Notes (Signed)
 Her TSH is elevated which indicates she may not be getting enough thyroid  medication. Recommend she contact her endocrinology ASAP to discuss dosing but she probably needs to increase the dose.

## 2024-01-29 NOTE — Telephone Encounter (Signed)
 Please see result note. If she does not have a current endocrinologist then we can refer her but it may take several weeks to months before she can get in to a new one.

## 2024-01-29 NOTE — Telephone Encounter (Signed)
 Called and spoke to patient and expressed that Dr. Kennyth has yet to dictate labs. Patient notified to allow a couple more days for dictation and if no call or no message in Mychart to call the office back. Patient verbalized understanding. Patient expressed she was concerns of high number and needed peace of mind or next steps.   Copied from CRM #[id number]. Topic: Clinical - Lab/Test Results >> Jan 28, 2024  3:23 PM Diane Alexander wrote: Reason for CRM: patient has called to go over recent results with a nurse. Please return call to speak with her.   Phone number: [phone]

## 2024-01-29 NOTE — Telephone Encounter (Signed)
 See results note.

## 2024-01-29 NOTE — Telephone Encounter (Signed)
 Patient has called today to speak with Dr Carroll nurse regarding her lab work and medication  and wants an referral to an endocrinologist. Please call patient back to discuss concerns.

## 2024-01-30 NOTE — Progress Notes (Signed)
 Can we verify which dose she is currently taking? On her med list it is listed as 125mcg 5 times weekly and 112mcg twice weekly. If this is accurate then recommend she increase to 125mcg DAILY and we should recheck in 4-6 weeks.  Ok to place referral to endocrinology if she is agreeable.

## 2024-01-31 NOTE — Telephone Encounter (Signed)
 Patient want to know why her CO2 is elevated and what she needs to do to bring it back to normal  Please advise

## 2024-01-31 NOTE — Telephone Encounter (Signed)
 I understand the frustration. We can refer her to [facility] if she is ok with that. Please see my previous message regarding dose adjustments because it will probably be weeks to months before she can get a new patient appointment.

## 2024-01-31 NOTE — Telephone Encounter (Signed)
 Her co2 was just one point above normal. This is not clinically significant and can be influenced by respiratory rate (holding breath can increase it and hyperventilating can decrease it), if she is dehydrated, etc.

## 2024-01-31 NOTE — Telephone Encounter (Signed)
 Spoke with patient information given  Her co2 was just one point above normal. This is not clinically significant and can be influenced by respiratory rate (holding breath can increase it and hyperventilating can decrease it), if she is dehydrated, etc. Verbalized understanding

## 2024-01-31 NOTE — Telephone Encounter (Signed)
 See note

## 2024-01-31 NOTE — Telephone Encounter (Signed)
 Spoke with patient   Advise to continue taking synthroid  125mcg DAILY  Recheck TSH in 4-6 weeks  Patient stated hold on referral per now, she will give us  a call back with preferred endocrinology

## 2024-02-02 ENCOUNTER — Encounter: Payer: Self-pay | Admitting: Family Medicine

## 2024-02-04 ENCOUNTER — Other Ambulatory Visit: Payer: Self-pay | Admitting: *Deleted

## 2024-02-04 MED ORDER — LEVOTHYROXINE SODIUM 125 MCG PO TABS: 125.0000 ug | ORAL_TABLET | Freq: Every day | ORAL | 1 refills | Status: AC
